# Patient Record
Sex: Female | Born: 1935 | Race: Black or African American | Hispanic: No | Smoking: Former smoker
Health system: Southern US, Community
[De-identification: ages and names within clinical notes are randomized; demographics above are authoritative.]

## PROBLEM LIST (undated history)

## (undated) DIAGNOSIS — M47812 Spondylosis without myelopathy or radiculopathy, cervical region: Secondary | ICD-10-CM

## (undated) DIAGNOSIS — N63 Unspecified lump in unspecified breast: Secondary | ICD-10-CM

## (undated) DIAGNOSIS — I420 Dilated cardiomyopathy: Secondary | ICD-10-CM

## (undated) DIAGNOSIS — I10 Essential (primary) hypertension: Secondary | ICD-10-CM

## (undated) HISTORY — PX: BACK SURGERY: SHX140

## (undated) HISTORY — PX: APPENDECTOMY: SHX54

## (undated) HISTORY — PX: CATARACT EXTRACTION: SUR2

## (undated) HISTORY — DX: Dilated cardiomyopathy: I42.0

## (undated) HISTORY — PX: KNEE SURGERY: SHX244

---

## 1997-11-20 ENCOUNTER — Other Ambulatory Visit: Admission: RE | Admit: 1997-11-20 | Discharge: 1997-11-20 | Payer: Self-pay | Admitting: Internal Medicine

## 1998-06-22 ENCOUNTER — Ambulatory Visit (HOSPITAL_COMMUNITY): Admission: RE | Admit: 1998-06-22 | Discharge: 1998-06-22 | Payer: Self-pay | Admitting: Neurosurgery

## 1998-06-22 ENCOUNTER — Encounter: Payer: Self-pay | Admitting: Neurosurgery

## 1998-07-08 ENCOUNTER — Encounter: Payer: Self-pay | Admitting: Neurosurgery

## 1998-07-08 ENCOUNTER — Ambulatory Visit (HOSPITAL_COMMUNITY): Admission: RE | Admit: 1998-07-08 | Discharge: 1998-07-08 | Payer: Self-pay | Admitting: Neurosurgery

## 1998-07-23 ENCOUNTER — Ambulatory Visit (HOSPITAL_COMMUNITY): Admission: RE | Admit: 1998-07-23 | Discharge: 1998-07-23 | Payer: Self-pay | Admitting: Neurosurgery

## 1998-07-23 ENCOUNTER — Encounter: Payer: Self-pay | Admitting: Neurosurgery

## 1998-08-06 ENCOUNTER — Encounter: Payer: Self-pay | Admitting: Neurosurgery

## 1998-08-06 ENCOUNTER — Ambulatory Visit (HOSPITAL_COMMUNITY): Admission: RE | Admit: 1998-08-06 | Discharge: 1998-08-06 | Payer: Self-pay | Admitting: Neurosurgery

## 1998-11-19 ENCOUNTER — Encounter: Payer: Self-pay | Admitting: Neurosurgery

## 1998-11-21 ENCOUNTER — Encounter: Payer: Self-pay | Admitting: Neurosurgery

## 1998-11-21 ENCOUNTER — Inpatient Hospital Stay (HOSPITAL_COMMUNITY): Admission: RE | Admit: 1998-11-21 | Discharge: 1998-11-24 | Payer: Self-pay | Admitting: Neurosurgery

## 1999-06-03 ENCOUNTER — Encounter: Admission: RE | Admit: 1999-06-03 | Discharge: 1999-06-03 | Payer: Self-pay | Admitting: Neurosurgery

## 1999-06-03 ENCOUNTER — Encounter: Payer: Self-pay | Admitting: Neurosurgery

## 1999-06-19 ENCOUNTER — Encounter: Payer: Self-pay | Admitting: Neurosurgery

## 1999-06-19 ENCOUNTER — Ambulatory Visit (HOSPITAL_COMMUNITY): Admission: RE | Admit: 1999-06-19 | Discharge: 1999-06-19 | Payer: Self-pay | Admitting: Neurosurgery

## 1999-07-07 ENCOUNTER — Inpatient Hospital Stay (HOSPITAL_COMMUNITY): Admission: RE | Admit: 1999-07-07 | Discharge: 1999-07-09 | Payer: Self-pay | Admitting: Neurosurgery

## 1999-07-07 ENCOUNTER — Encounter: Payer: Self-pay | Admitting: Neurosurgery

## 1999-10-22 ENCOUNTER — Encounter (HOSPITAL_BASED_OUTPATIENT_CLINIC_OR_DEPARTMENT_OTHER): Payer: Self-pay | Admitting: General Surgery

## 1999-10-22 ENCOUNTER — Encounter: Admission: RE | Admit: 1999-10-22 | Discharge: 1999-10-22 | Payer: Self-pay | Admitting: General Surgery

## 2000-02-09 ENCOUNTER — Ambulatory Visit (HOSPITAL_COMMUNITY): Admission: RE | Admit: 2000-02-09 | Discharge: 2000-02-09 | Payer: Self-pay | Admitting: Neurosurgery

## 2000-02-09 ENCOUNTER — Encounter: Payer: Self-pay | Admitting: Neurosurgery

## 2000-04-22 ENCOUNTER — Encounter: Payer: Self-pay | Admitting: Neurosurgery

## 2000-04-22 ENCOUNTER — Inpatient Hospital Stay (HOSPITAL_COMMUNITY): Admission: AD | Admit: 2000-04-22 | Discharge: 2000-04-26 | Payer: Self-pay | Admitting: Internal Medicine

## 2000-04-22 ENCOUNTER — Ambulatory Visit (HOSPITAL_COMMUNITY): Admission: RE | Admit: 2000-04-22 | Discharge: 2000-04-22 | Payer: Self-pay | Admitting: Neurosurgery

## 2000-04-23 ENCOUNTER — Encounter: Payer: Self-pay | Admitting: Internal Medicine

## 2000-06-17 ENCOUNTER — Ambulatory Visit (HOSPITAL_COMMUNITY): Admission: RE | Admit: 2000-06-17 | Discharge: 2000-06-17 | Payer: Self-pay | Admitting: Gastroenterology

## 2000-10-24 ENCOUNTER — Encounter (HOSPITAL_BASED_OUTPATIENT_CLINIC_OR_DEPARTMENT_OTHER): Payer: Self-pay | Admitting: General Surgery

## 2000-10-24 ENCOUNTER — Encounter: Admission: RE | Admit: 2000-10-24 | Discharge: 2000-10-24 | Payer: Self-pay | Admitting: General Surgery

## 2000-12-28 ENCOUNTER — Encounter: Payer: Self-pay | Admitting: Neurosurgery

## 2000-12-28 ENCOUNTER — Ambulatory Visit (HOSPITAL_COMMUNITY): Admission: RE | Admit: 2000-12-28 | Discharge: 2000-12-28 | Payer: Self-pay | Admitting: Neurosurgery

## 2001-01-13 ENCOUNTER — Inpatient Hospital Stay (HOSPITAL_COMMUNITY): Admission: RE | Admit: 2001-01-13 | Discharge: 2001-01-19 | Payer: Self-pay | Admitting: Neurosurgery

## 2001-01-13 ENCOUNTER — Encounter: Payer: Self-pay | Admitting: Neurosurgery

## 2001-10-26 ENCOUNTER — Encounter: Admission: RE | Admit: 2001-10-26 | Discharge: 2001-10-26 | Payer: Self-pay | Admitting: Internal Medicine

## 2001-10-26 ENCOUNTER — Encounter: Payer: Self-pay | Admitting: Internal Medicine

## 2002-01-19 ENCOUNTER — Encounter: Payer: Self-pay | Admitting: Neurosurgery

## 2002-01-19 ENCOUNTER — Encounter: Admission: RE | Admit: 2002-01-19 | Discharge: 2002-01-19 | Payer: Self-pay | Admitting: Neurosurgery

## 2002-10-19 ENCOUNTER — Encounter: Payer: Self-pay | Admitting: Internal Medicine

## 2002-10-19 ENCOUNTER — Encounter: Admission: RE | Admit: 2002-10-19 | Discharge: 2002-10-19 | Payer: Self-pay | Admitting: Internal Medicine

## 2003-08-14 ENCOUNTER — Ambulatory Visit (HOSPITAL_COMMUNITY): Admission: RE | Admit: 2003-08-14 | Discharge: 2003-08-14 | Payer: Self-pay | Admitting: Gastroenterology

## 2003-10-29 ENCOUNTER — Encounter: Admission: RE | Admit: 2003-10-29 | Discharge: 2003-10-29 | Payer: Self-pay | Admitting: Internal Medicine

## 2004-01-16 ENCOUNTER — Ambulatory Visit (HOSPITAL_COMMUNITY): Admission: RE | Admit: 2004-01-16 | Discharge: 2004-01-16 | Payer: Self-pay | Admitting: Neurosurgery

## 2004-10-29 ENCOUNTER — Encounter: Admission: RE | Admit: 2004-10-29 | Discharge: 2004-10-29 | Payer: Self-pay | Admitting: Internal Medicine

## 2005-08-11 ENCOUNTER — Encounter: Admission: RE | Admit: 2005-08-11 | Discharge: 2005-08-11 | Payer: Self-pay | Admitting: Internal Medicine

## 2005-08-30 ENCOUNTER — Ambulatory Visit (HOSPITAL_COMMUNITY): Admission: RE | Admit: 2005-08-30 | Discharge: 2005-08-30 | Payer: Self-pay | Admitting: Neurosurgery

## 2005-10-06 ENCOUNTER — Other Ambulatory Visit: Admission: RE | Admit: 2005-10-06 | Discharge: 2005-10-06 | Payer: Self-pay | Admitting: Internal Medicine

## 2005-11-01 ENCOUNTER — Encounter: Admission: RE | Admit: 2005-11-01 | Discharge: 2005-11-01 | Payer: Self-pay | Admitting: Internal Medicine

## 2006-01-13 ENCOUNTER — Ambulatory Visit (HOSPITAL_BASED_OUTPATIENT_CLINIC_OR_DEPARTMENT_OTHER): Admission: RE | Admit: 2006-01-13 | Discharge: 2006-01-14 | Payer: Self-pay | Admitting: Orthopaedic Surgery

## 2006-11-17 ENCOUNTER — Encounter: Admission: RE | Admit: 2006-11-17 | Discharge: 2006-11-17 | Payer: Self-pay | Admitting: Internal Medicine

## 2007-08-28 ENCOUNTER — Encounter: Admission: RE | Admit: 2007-08-28 | Discharge: 2007-08-28 | Payer: Self-pay | Admitting: Neurosurgery

## 2007-10-04 ENCOUNTER — Encounter: Admission: RE | Admit: 2007-10-04 | Discharge: 2007-10-04 | Payer: Self-pay | Admitting: Neurosurgery

## 2007-10-17 ENCOUNTER — Encounter: Admission: RE | Admit: 2007-10-17 | Discharge: 2007-10-17 | Payer: Self-pay | Admitting: Neurosurgery

## 2007-11-21 ENCOUNTER — Encounter: Admission: RE | Admit: 2007-11-21 | Discharge: 2007-11-21 | Payer: Self-pay | Admitting: Internal Medicine

## 2007-12-29 ENCOUNTER — Encounter: Admission: RE | Admit: 2007-12-29 | Discharge: 2007-12-29 | Payer: Self-pay | Admitting: Neurosurgery

## 2008-08-16 ENCOUNTER — Encounter: Admission: RE | Admit: 2008-08-16 | Discharge: 2008-08-16 | Payer: Self-pay | Admitting: General Surgery

## 2008-11-21 ENCOUNTER — Encounter: Admission: RE | Admit: 2008-11-21 | Discharge: 2008-11-21 | Payer: Self-pay | Admitting: Internal Medicine

## 2008-12-13 ENCOUNTER — Ambulatory Visit (HOSPITAL_COMMUNITY): Admission: RE | Admit: 2008-12-13 | Discharge: 2008-12-13 | Payer: Self-pay | Admitting: Orthopaedic Surgery

## 2008-12-18 ENCOUNTER — Encounter: Admission: RE | Admit: 2008-12-18 | Discharge: 2008-12-18 | Payer: Self-pay | Admitting: Neurosurgery

## 2009-02-04 ENCOUNTER — Inpatient Hospital Stay (HOSPITAL_COMMUNITY): Admission: RE | Admit: 2009-02-04 | Discharge: 2009-02-05 | Payer: Self-pay | Admitting: Neurosurgery

## 2009-03-26 ENCOUNTER — Inpatient Hospital Stay (HOSPITAL_COMMUNITY): Admission: RE | Admit: 2009-03-26 | Discharge: 2009-03-28 | Payer: Self-pay | Admitting: Neurosurgery

## 2009-09-09 ENCOUNTER — Encounter (INDEPENDENT_AMBULATORY_CARE_PROVIDER_SITE_OTHER): Payer: Self-pay | Admitting: Orthopaedic Surgery

## 2009-09-09 ENCOUNTER — Inpatient Hospital Stay (HOSPITAL_COMMUNITY): Admission: RE | Admit: 2009-09-09 | Discharge: 2009-09-15 | Payer: Self-pay | Admitting: Orthopaedic Surgery

## 2010-05-03 ENCOUNTER — Encounter: Payer: Self-pay | Admitting: Neurosurgery

## 2010-05-04 ENCOUNTER — Encounter: Payer: Self-pay | Admitting: Neurosurgery

## 2010-06-29 LAB — BASIC METABOLIC PANEL
BUN: 16 mg/dL (ref 6–23)
BUN: 29 mg/dL — ABNORMAL HIGH (ref 6–23)
CO2: 28 mEq/L (ref 19–32)
CO2: 29 mEq/L (ref 19–32)
Calcium: 8.4 mg/dL (ref 8.4–10.5)
Calcium: 8.5 mg/dL (ref 8.4–10.5)
Calcium: 8.5 mg/dL (ref 8.4–10.5)
Creatinine, Ser: 1.08 mg/dL (ref 0.4–1.2)
Creatinine, Ser: 1.21 mg/dL — ABNORMAL HIGH (ref 0.4–1.2)
GFR calc Af Amer: 51 mL/min — ABNORMAL LOW (ref >60)
GFR calc Af Amer: 53 mL/min — ABNORMAL LOW (ref >60)
GFR calc Af Amer: 56 mL/min — ABNORMAL LOW (ref >60)
GFR calc Af Amer: 57 mL/min — ABNORMAL LOW (ref >60)
GFR calc Af Amer: 58 mL/min — ABNORMAL LOW (ref >60)
GFR calc non Af Amer: 44 mL/min — ABNORMAL LOW (ref >60)
GFR calc non Af Amer: 46 mL/min — ABNORMAL LOW (ref >60)
GFR calc non Af Amer: 47 mL/min — ABNORMAL LOW (ref >60)
GFR calc non Af Amer: 48 mL/min — ABNORMAL LOW (ref >60)
Glucose, Bld: 115 mg/dL — ABNORMAL HIGH (ref 70–99)
Glucose, Bld: 143 mg/dL — ABNORMAL HIGH (ref 70–99)
Potassium: 3.4 mEq/L — ABNORMAL LOW (ref 3.5–5.1)
Potassium: 4.2 mEq/L (ref 3.5–5.1)
Potassium: 5.1 mEq/L (ref 3.5–5.1)
Sodium: 136 mEq/L (ref 135–145)
Sodium: 137 mEq/L (ref 135–145)
Sodium: 139 mEq/L (ref 135–145)

## 2010-06-29 LAB — COMPREHENSIVE METABOLIC PANEL
ALT: 9 U/L (ref 0–35)
AST: 21 U/L (ref 0–37)
Albumin: 4 g/dL (ref 3.5–5.2)
Alkaline Phosphatase: 115 U/L (ref 39–117)
CO2: 25 mEq/L (ref 19–32)
Chloride: 103 mEq/L (ref 96–112)
Creatinine, Ser: 1.21 mg/dL — ABNORMAL HIGH (ref 0.4–1.2)
GFR calc Af Amer: 51 mL/min — ABNORMAL LOW (ref >60)
GFR calc non Af Amer: 42 mL/min — ABNORMAL LOW (ref >60)
Potassium: 3.7 mEq/L (ref 3.5–5.1)
Total Bilirubin: 0.4 mg/dL (ref 0.3–1.2)

## 2010-06-29 LAB — CBC
HCT: 30.6 % — ABNORMAL LOW (ref 36.0–46.0)
Hemoglobin: 10.6 g/dL — ABNORMAL LOW (ref 12.0–15.0)
Hemoglobin: 8.3 g/dL — ABNORMAL LOW (ref 12.0–15.0)
Hemoglobin: 9 g/dL — ABNORMAL LOW (ref 12.0–15.0)
MCHC: 32.1 g/dL (ref 30.0–36.0)
MCHC: 33.1 g/dL (ref 30.0–36.0)
MCHC: 33.3 g/dL (ref 30.0–36.0)
MCHC: 33.4 g/dL (ref 30.0–36.0)
MCV: 86.6 fL (ref 78.0–100.0)
MCV: 86.9 fL (ref 78.0–100.0)
MCV: 87.4 fL (ref 78.0–100.0)
MCV: 86 fL (ref 78.0–100.0)
Platelets: 130 10*3/uL — ABNORMAL LOW (ref 150–400)
Platelets: 132 10*3/uL — ABNORMAL LOW (ref 150–400)
Platelets: 245 10*3/uL (ref 150–400)
Platelets: 192 10*3/uL (ref 150–400)
RBC: 2.89 MIL/uL — ABNORMAL LOW (ref 3.87–5.11)
RBC: 3.34 MIL/uL — ABNORMAL LOW (ref 3.87–5.11)
RBC: 3.56 MIL/uL — ABNORMAL LOW (ref 3.87–5.11)
RBC: 3.57 MIL/uL — ABNORMAL LOW (ref 3.87–5.11)
RBC: 4.43 MIL/uL (ref 3.87–5.11)
RDW: 15.1 % (ref 11.5–15.5)
WBC: 11 10*3/uL — ABNORMAL HIGH (ref 4.0–10.5)
WBC: 15.2 10*3/uL — ABNORMAL HIGH (ref 4.0–10.5)
WBC: 16.6 10*3/uL — ABNORMAL HIGH (ref 4.0–10.5)
WBC: 17.5 10*3/uL — ABNORMAL HIGH (ref 4.0–10.5)
WBC: 8.9 10*3/uL (ref 4.0–10.5)

## 2010-06-29 LAB — BODY FLUID CULTURE: Culture: NO GROWTH

## 2010-06-29 LAB — CROSSMATCH
ABO/RH(D): A POS
Antibody Screen: NEGATIVE
Donor AG Type: NEGATIVE

## 2010-06-29 LAB — URINALYSIS, ROUTINE W REFLEX MICROSCOPIC
Glucose, UA: NEGATIVE mg/dL
Hgb urine dipstick: NEGATIVE
Protein, ur: NEGATIVE mg/dL
Specific Gravity, Urine: 1.021 (ref 1.005–1.030)
pH: 5.5 (ref 5.0–8.0)

## 2010-06-29 LAB — DIFFERENTIAL
Basophils Absolute: 0 10*3/uL (ref 0.0–0.1)
Basophils Relative: 0 % (ref 0–1)
Eosinophils Absolute: 0.2 10*3/uL (ref 0.0–0.7)
Eosinophils Relative: 2 % (ref 0–5)
Monocytes Absolute: 0.7 10*3/uL (ref 0.1–1.0)
Monocytes Relative: 8 % (ref 3–12)

## 2010-06-29 LAB — PROTIME-INR
INR: 1.24 (ref 0.00–1.49)
INR: 1.33 (ref 0.00–1.49)
INR: 1.47 (ref 0.00–1.49)
INR: 1.82 — ABNORMAL HIGH (ref 0.00–1.49)
INR: 1.14 (ref 0.00–1.49)
Prothrombin Time: 15.4 seconds — ABNORMAL HIGH (ref 11.6–15.2)
Prothrombin Time: 16.4 seconds — ABNORMAL HIGH (ref 11.6–15.2)

## 2010-06-29 LAB — URINE MICROSCOPIC-ADD ON

## 2010-06-29 LAB — GRAM STAIN

## 2010-06-29 LAB — TYPE AND SCREEN: Donor AG Type: NEGATIVE

## 2010-06-29 LAB — URINE CULTURE

## 2010-06-29 LAB — ANAEROBIC CULTURE

## 2010-06-29 LAB — PREPARE RBC (CROSSMATCH)

## 2010-07-14 LAB — APTT: aPTT: 27 seconds (ref 24–37)

## 2010-07-14 LAB — COMPREHENSIVE METABOLIC PANEL
Albumin: 3.9 g/dL (ref 3.5–5.2)
Alkaline Phosphatase: 96 U/L (ref 39–117)
BUN: 31 mg/dL — ABNORMAL HIGH (ref 6–23)
CO2: 27 mEq/L (ref 19–32)
Chloride: 104 mEq/L (ref 96–112)
GFR calc non Af Amer: 33 mL/min — ABNORMAL LOW (ref >60)
Glucose, Bld: 113 mg/dL — ABNORMAL HIGH (ref 70–99)
Potassium: 4 mEq/L (ref 3.5–5.1)
Total Bilirubin: 0.6 mg/dL (ref 0.3–1.2)

## 2010-07-14 LAB — CBC
HCT: 38 % (ref 36.0–46.0)
Hemoglobin: 12.7 g/dL (ref 12.0–15.0)
MCV: 85.6 fL (ref 78.0–100.0)
Platelets: 188 10*3/uL (ref 150–400)
RBC: 4.44 MIL/uL (ref 3.87–5.11)
WBC: 12.1 10*3/uL — ABNORMAL HIGH (ref 4.0–10.5)

## 2010-07-14 LAB — DIFFERENTIAL
Basophils Absolute: 0.1 10*3/uL (ref 0.0–0.1)
Basophils Relative: 0 % (ref 0–1)
Eosinophils Relative: 2 % (ref 0–5)
Monocytes Absolute: 0.7 10*3/uL (ref 0.1–1.0)
Neutro Abs: 8.4 10*3/uL — ABNORMAL HIGH (ref 1.7–7.7)

## 2010-07-14 LAB — URINE MICROSCOPIC-ADD ON

## 2010-07-14 LAB — URINALYSIS, ROUTINE W REFLEX MICROSCOPIC
Glucose, UA: NEGATIVE mg/dL
Ketones, ur: 15 mg/dL — AB
pH: 5.5 (ref 5.0–8.0)

## 2010-07-14 LAB — MRSA PCR SCREENING: MRSA by PCR: NEGATIVE

## 2010-07-14 LAB — PROTIME-INR: Prothrombin Time: 14.4 seconds (ref 11.6–15.2)

## 2010-07-16 LAB — DIFFERENTIAL
Basophils Relative: 1 % (ref 0–1)
Eosinophils Absolute: 0.2 10*3/uL (ref 0.0–0.7)
Monocytes Absolute: 0.6 10*3/uL (ref 0.1–1.0)
Monocytes Relative: 6 % (ref 3–12)
Neutro Abs: 5.5 10*3/uL (ref 1.7–7.7)

## 2010-07-16 LAB — URINALYSIS, ROUTINE W REFLEX MICROSCOPIC
Nitrite: NEGATIVE
Specific Gravity, Urine: 1.024 (ref 1.005–1.030)
pH: 5 (ref 5.0–8.0)

## 2010-07-16 LAB — URINE MICROSCOPIC-ADD ON

## 2010-07-16 LAB — CBC
Platelets: 194 10*3/uL (ref 150–400)
RDW: 15.8 % — ABNORMAL HIGH (ref 11.5–15.5)

## 2010-07-16 LAB — COMPREHENSIVE METABOLIC PANEL
ALT: 9 U/L (ref 0–35)
Albumin: 4 g/dL (ref 3.5–5.2)
Alkaline Phosphatase: 103 U/L (ref 39–117)
GFR calc Af Amer: 38 mL/min — ABNORMAL LOW (ref >60)
Potassium: 4.2 mEq/L (ref 3.5–5.1)
Sodium: 142 mEq/L (ref 135–145)
Total Protein: 7.3 g/dL (ref 6.0–8.3)

## 2010-07-16 LAB — APTT: aPTT: 24 seconds (ref 24–37)

## 2010-08-20 ENCOUNTER — Ambulatory Visit (HOSPITAL_COMMUNITY)
Admission: RE | Admit: 2010-08-20 | Discharge: 2010-08-20 | Disposition: A | Discharge status: Home / Self Care | Payer: Medicare Other | Source: Ambulatory Visit | Attending: Internal Medicine | Admitting: Internal Medicine

## 2010-08-20 DIAGNOSIS — R42 Dizziness and giddiness: Secondary | ICD-10-CM | POA: Insufficient documentation

## 2010-08-20 DIAGNOSIS — I6529 Occlusion and stenosis of unspecified carotid artery: Secondary | ICD-10-CM

## 2010-08-20 DIAGNOSIS — I059 Rheumatic mitral valve disease, unspecified: Secondary | ICD-10-CM | POA: Insufficient documentation

## 2010-08-20 DIAGNOSIS — I079 Rheumatic tricuspid valve disease, unspecified: Secondary | ICD-10-CM | POA: Insufficient documentation

## 2010-08-20 DIAGNOSIS — I10 Essential (primary) hypertension: Secondary | ICD-10-CM | POA: Insufficient documentation

## 2010-08-28 NOTE — Op Note (Signed)
[place]. [facility]  Patient:    [name], [name] Visit Number: [id number] MRN: [id number]          Service Type: SUR Location: [place] 01 Attending Physician:  Melton Krebs Dictated by:   Elizabeth Sauer, M.D. Proc. Date: 01/13/01 Admit Date:  01/13/2001                             Operative Report  PREOPERATIVE DIAGNOSIS:  Spondylosis L2-3 and L3-4.  POSTOPERATIVE DIAGNOSIS:  Spondylosis L2-3 and L3-4.  OPERATIVE PROCEDURE:  L2-3 and L3-4 laminectomy, diskectomy and posterior lumbar interbody fusion with a Ray threaded fusion cage and L2 to S1 pedicle fixation.  SURGEON:  Elizabeth Sauer, M.D.  ASSISTANT:  Hosie Spangle, M.D.  ANESTHESIA:  General endotracheal anesthesia.  PREP:  Sterile Betadine prep and alcohol wipe.  COMPLICATIONS:  None.  INDICATIONS:  The patient is a 77 year old, right-handed black lady with severe spondylosis.  DESCRIPTION OF PROCEDURE:  She was taken to the operating suite, smoothly anesthetized and intubated, and placed prone on the operating table.  Following shave, prep and drape in the usual sterile fashion, the skin was infiltrated 1% lidocaine with 1:400,000 epinephrine.  The skin was incised from L1 to S1.  The lamina, remaining facet joints and transverse processes of L2, L3, L4, L5 and S1 were exposed bilaterally in the subperiosteal plane. Previous Ray cage fusions had been done at L4-5 and L5-S1.  Intraoperative x-ray confirmed correctness of level.  The pars articularis remaining lamina and inferior facet of L2 and L3 and the superior facet of L4 and L3 were removed bilaterally.  This exposed the L2, L3 and L4 roots as they rounded their respective pedicles.  On the left side at L2-3 and L3-4, there was significant lateral recess narrowing.  This was greatly decompressed with resection of the disk material and the facets.  The nerve roots were carefully explored and found to be uncovered.  Ray  threaded fusion cages were then placed at L2-3 and L3-4.  One cage had to be redirected on the right side.  Following placement of cages, pedicle screws were placed using the standard landmarks at L2, L3, L4, L5 and S1.  Intraoperative x-rays showed good placement of pedicle screws.  The rod was connected to the 90-D pedicle screws and capped.  Intraoperative x-rays show good placement of Ray cages which had been previously packed with bone graft as well as pedicle screws and rods.  Bone marrow aspirate was taken from the sacrum and the VTOS was then placed laterally over the transverse process after they had been decorticated.  The entire area was irrigated and hemostasis assured.  The fascia was reapproximated with 0 Vicryl in interrupted fashion.  Subcutaneous tissue was reapproximated with 0 Vicryl in interrupted fashion.  Subcuticular tissue was reapproximated with 3-0 Vicryl in interrupted fashion.  Skin was closed with 3-0 nylon in a running locked fashion.  Betadine and Telfa dressing was applied and made occlusive with Op-Site.  The patient returned to the recovery room in good condition. Dictated by:   Elizabeth Sauer, M.D. Attending Physician:  Melton Krebs DD:  01/13/01 TD:  01/14/01 Job: [id number]

## 2010-08-28 NOTE — H&P (Signed)
. [facility]  Patient:    [name], [name]                       MRN: [id number] Adm. Date:  [id number] Attending:  Melton Krebs                         History and Physical  ADMITTING DIAGNOSIS:  Foraminal disk at L2-3 on the left side.  SERVICE:  Neurosurgery.  HISTORY OF PRESENT ILLNESS:  This is a very nice 77 year old right-handed black  lady who in 1993 underwent a decompressive laminectomy at 4-5.  She had a lot of stenosis and a large osteophyte at L4-5, and underwent in L4-5 and L5-S1 a laminectomy, diskectomy, posterior lumbar interbody fusion.  She did reasonably  well with that, and then continued to have a lot of pain radiating down the lateral aspect of the hip and the left leg.  She underwent a CT scan, showed the cages ere well fused, and then an MRI that demonstrated a herniated disk with foraminal disk at L2-3, ______ compression at 2 in the foramen and 3 medially, and she is now admitted for an L2-3 laminectomy and diskectomy.  MEDICAL HISTORY:  Remarkable for hypertension, taking Plendil 10 mg a day and Hytrin 5 mg at bedtime.  SURGICAL HISTORY:  Remarkable for knee replacements in 1991.  She had an appendectomy as a child and anterior cervical diskectomy and fusion at 5-6 and -5.  ALLERGIES:  She has no allergies.  SOCIAL HISTORY:  She and her husband live in [place].  She neither smokes nor drinks.  FAMILY HISTORY:  Revealed her parents are deceased in their mid 31s of heart diseases and diabetes.  PHYSICAL EXAMINATION:  HEENT:  Within normal limits.  NECK:  She has good range of motion of her neck.  CHEST:  Clear.  CARDIAC:  Regular rate and rhythm.  ABDOMEN:  Nontender, no hepatosplenomegaly.  EXTREMITIES:  Without clubbing or cyanosis.  GENITOURINARY:  Deferred.  Peripheral pulses are good.  NEUROLOGIC:  She is awake, alert, and oriented.  Her cranial nerves are intact.  Motor exam reveals  strength 5/5 throughout the upper and lower extremities. She has positive straight leg raise with hip down into the groin, and sensory deficit and pain in an L2 and L3 distribution.  CLINICAL IMPRESSION:  L2-3 radiculopathy secondary to disk.  PLAN:  Lumbar laminectomy, diskectomy at L2-3.  The risks and benefits of this approach have been discussed with her, and she wished to proceed. DD:  07/07/99 TD:  07/07/99 Job: [id number]

## 2010-08-28 NOTE — Discharge Summary (Signed)
[place]. [facility]  Patient:    [name], [name]                       MRN: [id number] Adm. Date:  [id number] Disc. Date: [id number] Attending:  Melton Krebs                           Discharge Summary  COMPLICATIONS:  None.  PROCEDURE:  L2-3 extra foraminal diskectomy on the left side.  HISTORY OF PRESENT ILLNESS:  This is a 77 year old, right handed black lady whose history and physical is recounted on the chart. She has undergone a decompressive laminectomy at 4-5 and 5-1 and lumbar fusion at L4-5 and L5-S1 and now has a left foraminal disc with a left L2 and L3 radiculopathy.  PAST MEDICAL HISTORY: Remarkable for hypertension.  MEDICATIONS: She is taking Plendil and Hytrin.  PHYSICAL EXAMINATION:  General examination is unremarkable.  Neurologic exam was a left L2 radiculopathy.  HOSPITAL COURSE:  She was admitted after ascertainment of normal laboratory values and underwent a 2-3 extra foraminal diskectomy.  Postoperatively, her leg pain is completely gone.  Her strength is full.  Her incision is dry and well healing.  She is given one post OR and now that she is up and about she is being discharged home in the care of her family. She is going home on Vicodin for pain.  Her follow up will be in the [place] office in about ten days for suture removal. DD:  07/09/99 TD:  07/10/99 Job: [id number]

## 2010-08-28 NOTE — Op Note (Signed)
NAMELISETT, [name]                ACCOUNT NO.:  [id number]   MEDICAL RECORD NO.:  [id number]          PATIENT TYPE:  AMB   LOCATION:  DSC                          FACILITY:  [facility]   PHYSICIAN:  Vonna Kotyk. Whitfield, M.D.DATE OF BIRTH:  [birth year]   DATE OF PROCEDURE:  01/13/2006  DATE OF DISCHARGE:                                 OPERATIVE REPORT   PREOPERATIVE DIAGNOSES:  Impingement, right shoulder, with degenerative  arthrosis of shoulder joint, partial rotator cuff tear, degenerative joint  disease, acromioclavicular joint, and possible tear, long head of biceps  tendon.   POSTOPERATIVE DIAGNOSES:  1. Degenerative arthrosis, right shoulder.  2. Impingement.  3. Degenerative joint disease, acromioclavicular joint.   PROCEDURES:  1. Diagnostic arthroscopy, right shoulder, with aggressive debridement of      synovitis, chondroma and partial rotator cuff tear.  2. Arthroscopic subacromial decompression.  3. Arthroscopic distal clavicle resection.   SURGEON:  Vonna Kotyk. Durward Fortes, M.D.   ASSISTANT:  Erskine Emery, P.A.-C.   ANESTHESIA:  General orotracheal.   COMPLICATIONS:  None.   HISTORY:  An 77 year old female, who has been followed for problems  referable to her right shoulder.  She relates progressive pain over many  months, particularly with over-activity, and even occasionally sleeping on  her shoulder.  She has evidence of impingement and possible rotator cuff  tear with an MRI scan, revealing a partial rotator cuff tear, but  considerable impingement, chondromalacia of the joint and degenerative  change of the AC joint.  She has not responded to cortisone and anti-  inflammatory medicine and wishes to proceed with an arthroscopic evaluation.   DESCRIPTION OF PROCEDURES:  With the patient comfortable on the operating  table, and general orotracheal anesthesia, the patient was placed in a  semisitting position with a shoulder frame.  The right shoulder was then  prepped with DuraPrep from the base of the neck circumferentially to below  the elbow.   A marking pen was used to outline the [facility] joint, the coracoid and acromion.  At a point a fingerbreadths posterior and medial to the posterior angle of  the acromion, a small stab wound was made and the arthroscope easily placed  into the shoulder joint.  Diagnostic arthroscopy revealed diffuse synovitis.  There were considerable chondromalacia and thinning of the articular  cartilage on the humeral head, with some areas of exposed subchondral bone,  and a similar finding on the glenoid.  The anterior glenoid labrum was  intact.  There were no loose bodies.  The biceps tendon appeared intact,  without fraying or tears.  There was an obvious partial tear of the  supraspinatus at its humeral head attachment.  A second portal was  established anteriorly and the ArthroCare Wand inserted for debridement of  the synovitis, the loose articular cartilage and the partial rotator cuff  tear.   The arthroscope was then placed in the subacromial space posteriorly, the  camera in the subacromial space anteriorly, and a third portal established  in the lateral subacromial space.  An arthroscopic subacromial decompression  was performed, removing abundant bursal tissue.  There was obvious anterior  overhang of the acromion.  A 6-mm bur was used to debride this so that there  was no further impingement.  I carefully checked the rotator cuff at the  bursal surface and did not see any evidence of a full-thickness tear.   There was considerable chondromalacia and decrease in the space at the [facility]  joint.  Accordingly, a distal clavicle resection was performed with the same  6-mm bur with a very nice resection.  The space was then evaluated with  evidence of loose material.  Three stab wounds were infiltrated with 0.25%  Marcaine with epinephrine and closed with simple 4-0 Ethalon.  A sterile  bulky dressing was applied,  followed by a sling.   PLAN:  Recovery care, Percocet for pain, office in 1 week.      Vonna Kotyk. Durward Fortes, M.D.  Electronically Signed     PWW/MEDQ  D:  01/13/2006  T:  01/13/2006  Job:  [id number]

## 2010-08-28 NOTE — Op Note (Signed)
[place]. [facility]  Patient:    [name], [name]                       MRN: [id number] Proc. Date: 07/07/99 Adm. Date:  [id number] Attending:  Melton Krebs                           Operative Report  PREOPERATIVE DIAGNOSIS:  ____________ disk ____________  POSTOPERATIVE DIAGNOSIS:  ____________ disk ____________  OPERATION PERFORMED:  L2-3 foraminal diskectomy.  SURGEON:  Elizabeth Sauer, M.D.  ANESTHESIA:  General endotracheal.  PREP:  Sterile Betadine prep and scrub with alcohol wipe.  COMPLICATIONS:  None.  DESCRIPTION OF PROCEDURE:  This 77 year old right-handed black lady with a left  L2-L3 radiculopathy and foraminal disk at L2-3.  The patient was taken to the operating room, smoothly anesthetized and intubated, and placed prone on the operating table.  Following shave, prep and drape in the usual sterile fashion he skin was infiltrated with 1% lidocaine 1:400,000 epinephrine.  Skin incision was made and extended from the bottom of L1 to the top of L3 and the lamina of L1, 2 and L3 were exposed on the left side in the subperiosteal plane.  Several x-rays were taken to ensure we were at the correct level.  The final was taken with a nerve hook in the L2-3 neural foramen.  Working out over the transverse process of L2 and L3, the neural foramen was defined.  The intertransverse ligament was divided.  The neural foramen was carefully identified and the L2 root egressing and the dorsal root ganglion were identified.  Underneath this was identified a herniated fragment of disk which was removed.  The annular fibers of the disk were then incised and the disk space was evacuated to the extent it could be reached. The neural foramen was explored with a hook and using a curet, all material in he neural foramen was pushed into the disk space and then removed.  The nerve root was carefully palpated as well as the neural foramen found to be open.   The wound was irrigated and hemostasis assured.  The nerve root was then covered with Depo-Medrol soaked fat.  The fascia was reapproximated with 0 Vicryl in interrupted fashion. Subcutaneous tissues were reapproximated with 0 Vicryl in interrupted fashion, subcuticular tissues were reapproximated with 3-0 Vicryl in interrupted fashion. The skin was closed with 3-0 nylon in a running locked fashion.  A Betadine Telfa dressing was applied and made occlusive with Op-Site.  The patient then returned to the recovery room in good condition. DD:  07/07/99 TD:  07/08/99 Job: [id number]

## 2010-08-28 NOTE — Discharge Summary (Signed)
[place]. [facility]  Patient:    [name], [name] Visit Number: [id number] MRN: [id number]          Service Type: SUR Location: [place] 01 Attending Physician:  Melton Krebs Dictated by:   Elizabeth Sauer, M.D. Admit Date:  01/13/2001 Discharge Date: 01/19/2001                             Discharge Summary  ADMISSION DIAGNOSIS:  Lumbar spondylosis at 2-3 and 3-4.  DISCHARGE DIAGNOSIS:  Lumbar spondylosis at 2-3 and 3-4.  OPERATIVE PROCEDURE:  An L2-3 and L3-4 posterior lumbar interbody fusion with ___________ fusion cages and L4-5 and L5-S1 pedicle fixation.  SERVICE:  Neurosurgery.  COMPLICATIONS:  None.  DISCHARGE STATUS:  Alive and well.  HISTORY OF PRESENT ILLNESS:  A very nice 77 year old right-handed black lady whose history and physical is recounted in the chart.  She has undergone decompressive laminectomy as well as a fusion at 4-5. She had a myelogram that demonstrated severe spondylosis at 2-3 and 3-4. She was admitted for decompression fusion.  PAST MEDICAL HISTORY:  This is remarkable for hypertension.  MEDICATIONS:  Plendil.  PHYSICAL EXAMINATION:  General exam was unremarkable.  Neurologically, she had a tremendous amount of back pain as well as left hip pain.  Straight leg raising was positive.  Neurologically, she was intact.  HOSPITAL COURSE:  She was admitted after ascertainment of normal laboratory values and underwent an L2-3, L3-4 posterior lumbar interbody fusion with _____________ fusion cages, then she had placement of pedicle screws from L2 to S1 bilaterally.  Postoperatively, she has done well.  Her leg pain was gone. She was off the PCA in a day or two and has been participating in physical therapy.  She is up and mobile. There was some drainage from her incision.  She has been kept until that has stopped and is now drainage free for 36 hours.  She was placed on ciprofloxacin as a precaution for  skin coverage.  DISPOSITION:  She is being discharged home now to the care of her family on ciprofloxacin for five more days as well as on Percocet for pain.  FOLLOW-UP:  This will be in the [place] office in about 10 days for suture removal. Dictated by:   Elizabeth Sauer, M.D. Attending Physician:  Melton Krebs DD:  01/19/01 TD:  01/19/01 Job: [id number]

## 2010-08-28 NOTE — Procedures (Signed)
[facility]  Patient:    [name], [name]                       MRN: [id number] Proc. Date: 06/17/00 Adm. Date:  [id number] Disc. Date: [id number] Attending:  Rafael Bihari CC:         Don Broach. Carlis Abbott, M.D.   Procedure Report  PROCEDURE:  Colonoscopy with snare polypectomy and hot biopsy.  ENDOSCOPIST:  Elyse Jarvis. Amedeo Plenty, M.D.  INDICATIONS:  Screening colonoscopy in a 77 year old patient with a recent first bout of presumed diverticulitis.  DESCRIPTION OF PROCEDURE:  The patient was placed in the left lateral decubitus position and placed on the pulse monitor with continuous low flow oxygen delivered by nasal cannula.  She was sedated with 70 mg of IV Demerol and 7 mg IV Versed.  The Olympus video colonoscope was inserted into the rectum and advanced to the cecum, confirmed by transillumination of McBurneys point and visualization of the ileocecal valve and appendiceal orifice.  The prep was excellent.  In the base of the cecum there was seen a 6 mm polyp which was removed by hot biopsy.  In the ascending colon, there was a 1 cm polyp removed by snare and another 6 mm polyp removed by hot biopsy.  All polyps were sent in the same specimen container.  The remainder of the ascending as well as the transverse and descending colon appeared normal with no further polyps, masses, diverticula or other mucosal abnormalities.  There were several scattered sigmoid diverticula seen and no other abnormalities. The rectum appeared normal and retroflexed view of the anus revealed no obvious internal hemorrhoids.  The colonoscope was then withdrawn and the patient returned to the recovery room in stable condition.  She tolerated the procedure well, and there were no immediate complications.  IMPRESSION: 1. Cecal and ascending colon polyps. 2. Diverticulosis.  PLAN:  Await biopsy results for recommendations regarding future colon screening. DD:  06/17/00 TD:   06/19/00 Job: [id number]

## 2010-08-28 NOTE — H&P (Signed)
[place]. [facility]  Patient:    [name], [name] Visit Number: [id number] MRN: [id number]          Service Type: SUR Location: [place] 05 Attending Physician:  Melton Krebs Dictated by:   Elizabeth Sauer, M.D. Admit Date:  01/13/2001                           History and Physical  ADMISSION DIAGNOSIS:  Spondylosis at L2-3 and L3-4, status post L4-5 and L5-6 fusions.  HISTORY OF PRESENT ILLNESS:  This is a very nice, 77 year old, right-handed, black lady who has undergone a decompressive laminectomy at L4-5.  She did well and then developed pain in her back and down her leg in 2000.  She underwent an L4-5 and L5-S1 fusion, did well with that, and then developed a herniated disk on the right side at L3-4 in the extraforaminal position.  It was removed and she has had relentless increase in back pain and pain down both legs.  Myelography revealed severe stenosis at L2-3 and L3-4.  She is admitted now for fusion at L2-3 and L3-4 and pedicle fixation from L2-S1 for stabilization.  PAST MEDICAL HISTORY:  Remarkable for hypertension.  MEDICATIONS:  She is taking Plendil 10 mg a day and Hytrin 5 mg at bedtime.  PAST SURGICAL HISTORY:  Remarkable for knee replacements in 1991 with lumbar operations as noted before, appendectomy as a child, and anterior cervical diskectomy and fusion at C4-5 and C5-6.  ALLERGIES:  She has no allergies.  SOCIAL HISTORY:  She neither smokes nor drinks.  Both she and her husband live in [place], [place].  FAMILY HISTORY:  Both parents are deceased in their mid 46s of heart disease and diabetes.  REVIEW OF SYSTEMS:  Remarkable for back and right hip and leg pain.  PHYSICAL EXAMINATION:  Her HEENT exam is within normal limits.  NECK:  She has reasonable range of motion.  CHEST:  Clear.  CARDIAC:  Regular rate and rhythm.  ABDOMEN:  Nontender with no hepatosplenomegaly.  EXTREMITIES:  Without clubbing or  cyanosis.  GENITOURINARY:  Exam is deferred.  Peripheral pulses are good.  NEUROLOGIC:  She is awake, alert, and oriented.  Her cranial nerves are intact.  Motor exam shows 5/5 strength throughout the upper extremities.  In the lower extremities, she has dorsiflexion weakness on the left side at 5-/5. Straight leg raise is incredibly positive on the left side.  When she stands for more than about two minutes, her legs get extremely heavy and weak.  Lower extremity reflexes are absent at this time.  CLINICAL IMPRESSION:  Severe lumbar spondylosis with neurogenic claudication.  PLAN:  Interbody fusion at L2-3 and L3-4, as well as augmentation from L2-S1 with pedicle screws.  The risks and benefits of this approach have been discussed with her and she wishes to proceed. Dictated by:   Elizabeth Sauer, M.D. Attending Physician:  Melton Krebs DD:  01/13/01 TD:  01/13/01 Job: [id number]

## 2010-08-28 NOTE — Op Note (Signed)
[name], [name]                          ACCOUNT NO.:  [id number]   MEDICAL RECORD NO.:  [id number]                   PATIENT TYPE:  AMB   LOCATION:  ENDO                                 FACILITY:  [facility]   PHYSICIAN:  John C. Amedeo Plenty, M.D.                 DATE OF BIRTH:  1923-12-25   DATE OF PROCEDURE:  08/14/2003  DATE OF DISCHARGE:                                 OPERATIVE REPORT   PROCEDURE:  Colonoscopy.   INDICATION FOR PROCEDURE:  History of adenomatous colon polyps on initial  colonoscopy 3 years ago.   DESCRIPTION OF PROCEDURE:  The patient was placed in the left lateral  decubitus position and placed on the pulse monitor with continuous low-flow  oxygen delivered by nasal cannula.  She was sedated with 75 mcg IV fentanyl  and 7 mg IV Versed.  The Olympus video colonoscope was inserted into the  rectum and advanced to the cecum, confirmed by visualization of the  ileocecal valve.  I could not reach the extreme base of the cecum, and about  40% of the cecum beyond the ileocecal valve was not clearly visualized  despite abdominal pressure, torquing maneuvers, and position changes.  Otherwise, the cecum, ascending, transverse, descending, and sigmoid colon  all appeared normal with no masses, polyps, diverticula, or other mucosal  abnormalities.  The rectum likewise appeared normal, and retroflexed view of  the anus revealed no obvious internal hemorrhoids.  The scope was then  withdrawn and the patient returned to the recovery room in stable condition.  She tolerated the procedure well, and there were no immediate complications.   IMPRESSION:  Normal colonoscopy.   PLAN:  Repeat study in 5 years.                                               John C. Amedeo Plenty, M.D.    JCH/MEDQ  D:  08/14/2003  T:  08/14/2003  Job:  [id number]   cc:   Jeanann Lewandowsky, M.D.  [address] [place] [address]  Fax: [phone]

## 2010-09-14 ENCOUNTER — Other Ambulatory Visit: Payer: Self-pay | Admitting: Internal Medicine

## 2010-09-14 DIAGNOSIS — H532 Diplopia: Secondary | ICD-10-CM

## 2010-09-18 ENCOUNTER — Ambulatory Visit (HOSPITAL_COMMUNITY)
Admission: RE | Admit: 2010-09-18 | Discharge: 2010-09-18 | Disposition: A | Discharge status: Home / Self Care | Payer: Medicare Other | Source: Ambulatory Visit | Attending: Internal Medicine | Admitting: Internal Medicine

## 2010-09-18 DIAGNOSIS — I679 Cerebrovascular disease, unspecified: Secondary | ICD-10-CM | POA: Insufficient documentation

## 2010-09-18 DIAGNOSIS — J3489 Other specified disorders of nose and nasal sinuses: Secondary | ICD-10-CM | POA: Insufficient documentation

## 2010-09-18 DIAGNOSIS — H532 Diplopia: Secondary | ICD-10-CM | POA: Insufficient documentation

## 2010-09-18 MED ORDER — GADOBENATE DIMEGLUMINE 529 MG/ML IV SOLN: 10.0000 mL | Freq: Once | INTRAVENOUS | Status: DC

## 2011-06-11 ENCOUNTER — Other Ambulatory Visit: Payer: Self-pay | Admitting: Neurosurgery

## 2011-06-11 DIAGNOSIS — M47812 Spondylosis without myelopathy or radiculopathy, cervical region: Secondary | ICD-10-CM

## 2011-06-11 DIAGNOSIS — R292 Abnormal reflex: Secondary | ICD-10-CM

## 2011-06-11 DIAGNOSIS — M5030 Other cervical disc degeneration, unspecified cervical region: Secondary | ICD-10-CM

## 2011-06-21 ENCOUNTER — Ambulatory Visit
Admission: RE | Admit: 2011-06-21 | Discharge: 2011-06-21 | Disposition: A | Discharge status: Home / Self Care | Payer: Medicare Other | Source: Ambulatory Visit | Attending: Neurosurgery | Admitting: Neurosurgery

## 2011-06-21 DIAGNOSIS — M5030 Other cervical disc degeneration, unspecified cervical region: Secondary | ICD-10-CM

## 2011-06-21 DIAGNOSIS — R292 Abnormal reflex: Secondary | ICD-10-CM

## 2011-06-21 DIAGNOSIS — M47812 Spondylosis without myelopathy or radiculopathy, cervical region: Secondary | ICD-10-CM

## 2011-12-28 ENCOUNTER — Other Ambulatory Visit: Payer: Self-pay | Admitting: Internal Medicine

## 2011-12-28 DIAGNOSIS — R748 Abnormal levels of other serum enzymes: Secondary | ICD-10-CM

## 2012-01-12 ENCOUNTER — Ambulatory Visit (HOSPITAL_COMMUNITY)
Admission: RE | Admit: 2012-01-12 | Discharge: 2012-01-12 | Disposition: A | Discharge status: Home / Self Care | Payer: Medicare Other | Source: Ambulatory Visit | Attending: Internal Medicine | Admitting: Internal Medicine

## 2012-01-12 DIAGNOSIS — R748 Abnormal levels of other serum enzymes: Secondary | ICD-10-CM

## 2012-08-10 ENCOUNTER — Other Ambulatory Visit: Payer: Self-pay | Admitting: Internal Medicine

## 2012-08-10 DIAGNOSIS — Z1231 Encounter for screening mammogram for malignant neoplasm of breast: Secondary | ICD-10-CM

## 2012-09-18 ENCOUNTER — Ambulatory Visit
Admission: RE | Admit: 2012-09-18 | Discharge: 2012-09-18 | Disposition: A | Discharge status: Home / Self Care | Payer: Medicare Other | Source: Ambulatory Visit | Attending: Internal Medicine | Admitting: Internal Medicine

## 2012-09-18 ENCOUNTER — Other Ambulatory Visit: Payer: Self-pay | Admitting: Internal Medicine

## 2012-09-18 DIAGNOSIS — R928 Other abnormal and inconclusive findings on diagnostic imaging of breast: Secondary | ICD-10-CM

## 2012-09-18 DIAGNOSIS — Z1231 Encounter for screening mammogram for malignant neoplasm of breast: Secondary | ICD-10-CM

## 2012-09-19 ENCOUNTER — Other Ambulatory Visit: Payer: Self-pay | Admitting: Internal Medicine

## 2012-09-19 DIAGNOSIS — R928 Other abnormal and inconclusive findings on diagnostic imaging of breast: Secondary | ICD-10-CM

## 2012-10-03 ENCOUNTER — Other Ambulatory Visit: Payer: Self-pay | Admitting: Internal Medicine

## 2012-10-03 ENCOUNTER — Ambulatory Visit
Admission: RE | Admit: 2012-10-03 | Discharge: 2012-10-03 | Disposition: A | Discharge status: Home / Self Care | Payer: Medicare Other | Source: Ambulatory Visit | Attending: Internal Medicine | Admitting: Internal Medicine

## 2012-10-03 DIAGNOSIS — R928 Other abnormal and inconclusive findings on diagnostic imaging of breast: Secondary | ICD-10-CM

## 2013-02-26 ENCOUNTER — Other Ambulatory Visit: Payer: Self-pay | Admitting: Internal Medicine

## 2013-02-26 DIAGNOSIS — R921 Mammographic calcification found on diagnostic imaging of breast: Secondary | ICD-10-CM

## 2013-02-26 DIAGNOSIS — N6310 Unspecified lump in the right breast, unspecified quadrant: Secondary | ICD-10-CM

## 2013-03-29 ENCOUNTER — Ambulatory Visit
Admission: RE | Admit: 2013-03-29 | Discharge: 2013-03-29 | Disposition: A | Discharge status: Home / Self Care | Payer: Medicare Other | Source: Ambulatory Visit | Attending: Internal Medicine | Admitting: Internal Medicine

## 2013-03-29 DIAGNOSIS — R921 Mammographic calcification found on diagnostic imaging of breast: Secondary | ICD-10-CM

## 2013-03-29 DIAGNOSIS — N6310 Unspecified lump in the right breast, unspecified quadrant: Secondary | ICD-10-CM

## 2013-08-27 ENCOUNTER — Other Ambulatory Visit: Payer: Self-pay | Admitting: Internal Medicine

## 2013-08-27 DIAGNOSIS — R921 Mammographic calcification found on diagnostic imaging of breast: Secondary | ICD-10-CM

## 2013-08-27 DIAGNOSIS — N63 Unspecified lump in unspecified breast: Secondary | ICD-10-CM

## 2013-09-13 ENCOUNTER — Emergency Department (HOSPITAL_COMMUNITY): Payer: Medicare Other

## 2013-09-13 ENCOUNTER — Inpatient Hospital Stay (HOSPITAL_COMMUNITY): Payer: Medicare Other

## 2013-09-13 ENCOUNTER — Inpatient Hospital Stay (HOSPITAL_COMMUNITY)
Admission: EM | Admit: 2013-09-13 | Discharge: 2013-09-16 | DRG: 354 | Disposition: A | Discharge status: Home / Self Care | Payer: Medicare Other | Attending: Internal Medicine | Admitting: Internal Medicine

## 2013-09-13 ENCOUNTER — Encounter (HOSPITAL_COMMUNITY): Payer: Self-pay | Admitting: Emergency Medicine

## 2013-09-13 DIAGNOSIS — N184 Chronic kidney disease, stage 4 (severe): Secondary | ICD-10-CM

## 2013-09-13 DIAGNOSIS — I509 Heart failure, unspecified: Secondary | ICD-10-CM

## 2013-09-13 DIAGNOSIS — N179 Acute kidney failure, unspecified: Secondary | ICD-10-CM

## 2013-09-13 DIAGNOSIS — K56600 Partial intestinal obstruction, unspecified as to cause: Secondary | ICD-10-CM | POA: Diagnosis present

## 2013-09-13 DIAGNOSIS — K436 Other and unspecified ventral hernia with obstruction, without gangrene: Principal | ICD-10-CM | POA: Diagnosis present

## 2013-09-13 DIAGNOSIS — I10 Essential (primary) hypertension: Secondary | ICD-10-CM

## 2013-09-13 DIAGNOSIS — Z7982 Long term (current) use of aspirin: Secondary | ICD-10-CM

## 2013-09-13 DIAGNOSIS — N189 Chronic kidney disease, unspecified: Secondary | ICD-10-CM

## 2013-09-13 DIAGNOSIS — N183 Chronic kidney disease, stage 3 unspecified: Secondary | ICD-10-CM

## 2013-09-13 DIAGNOSIS — I129 Hypertensive chronic kidney disease with stage 1 through stage 4 chronic kidney disease, or unspecified chronic kidney disease: Secondary | ICD-10-CM | POA: Diagnosis present

## 2013-09-13 DIAGNOSIS — K56609 Unspecified intestinal obstruction, unspecified as to partial versus complete obstruction: Secondary | ICD-10-CM

## 2013-09-13 DIAGNOSIS — K439 Ventral hernia without obstruction or gangrene: Secondary | ICD-10-CM

## 2013-09-13 DIAGNOSIS — Z6834 Body mass index (BMI) 34.0-34.9, adult: Secondary | ICD-10-CM

## 2013-09-13 DIAGNOSIS — Z87891 Personal history of nicotine dependence: Secondary | ICD-10-CM

## 2013-09-13 DIAGNOSIS — Z9849 Cataract extraction status, unspecified eye: Secondary | ICD-10-CM

## 2013-09-13 DIAGNOSIS — K429 Umbilical hernia without obstruction or gangrene: Secondary | ICD-10-CM

## 2013-09-13 DIAGNOSIS — E669 Obesity, unspecified: Secondary | ICD-10-CM | POA: Diagnosis present

## 2013-09-13 HISTORY — DX: Essential (primary) hypertension: I10

## 2013-09-13 HISTORY — DX: Unspecified lump in unspecified breast: N63.0

## 2013-09-13 HISTORY — DX: Spondylosis without myelopathy or radiculopathy, cervical region: M47.812

## 2013-09-13 LAB — CBC WITH DIFFERENTIAL/PLATELET
Basophils Absolute: 0 10*3/uL (ref 0.0–0.1)
Basophils Relative: 0 % (ref 0–1)
EOS ABS: 0.3 10*3/uL (ref 0.0–0.7)
EOS PCT: 2 % (ref 0–5)
HEMATOCRIT: 39.6 % (ref 36.0–46.0)
Hemoglobin: 12.6 g/dL (ref 12.0–15.0)
LYMPHS ABS: 2.2 10*3/uL (ref 0.7–4.0)
LYMPHS PCT: 18 % (ref 12–46)
MCH: 28.4 pg (ref 26.0–34.0)
MCHC: 31.8 g/dL (ref 30.0–36.0)
MCV: 89.4 fL (ref 78.0–100.0)
MONO ABS: 0.7 10*3/uL (ref 0.1–1.0)
MONOS PCT: 6 % (ref 3–12)
Neutro Abs: 9.4 10*3/uL — ABNORMAL HIGH (ref 1.7–7.7)
Neutrophils Relative %: 74 % (ref 43–77)
Platelets: 202 10*3/uL (ref 150–400)
RBC: 4.43 MIL/uL (ref 3.87–5.11)
RDW: 15 % (ref 11.5–15.5)
WBC: 12.7 10*3/uL — AB (ref 4.0–10.5)

## 2013-09-13 LAB — URINALYSIS, ROUTINE W REFLEX MICROSCOPIC
BILIRUBIN URINE: NEGATIVE
GLUCOSE, UA: NEGATIVE mg/dL
HGB URINE DIPSTICK: NEGATIVE
KETONES UR: NEGATIVE mg/dL
Nitrite: NEGATIVE
PH: 5.5 (ref 5.0–8.0)
PROTEIN: NEGATIVE mg/dL
Specific Gravity, Urine: 1.022 (ref 1.005–1.030)
Urobilinogen, UA: 0.2 mg/dL (ref 0.0–1.0)

## 2013-09-13 LAB — COMPREHENSIVE METABOLIC PANEL
ALBUMIN: 4.1 g/dL (ref 3.5–5.2)
ALK PHOS: 142 U/L — AB (ref 39–117)
ALT: 12 U/L (ref 0–35)
AST: 18 U/L (ref 0–37)
BUN: 39 mg/dL — AB (ref 6–23)
CHLORIDE: 102 meq/L (ref 96–112)
CO2: 23 mEq/L (ref 19–32)
CREATININE: 1.86 mg/dL — AB (ref 0.50–1.10)
Calcium: 9.6 mg/dL (ref 8.4–10.5)
GFR calc Af Amer: 27 mL/min — ABNORMAL LOW (ref >90)
GFR calc non Af Amer: 23 mL/min — ABNORMAL LOW (ref >90)
Glucose, Bld: 114 mg/dL — ABNORMAL HIGH (ref 70–99)
POTASSIUM: 4.4 meq/L (ref 3.7–5.3)
Sodium: 141 mEq/L (ref 137–147)
TOTAL PROTEIN: 8.1 g/dL (ref 6.0–8.3)

## 2013-09-13 LAB — URINE MICROSCOPIC-ADD ON

## 2013-09-13 MED ORDER — IOHEXOL 300 MG/ML  SOLN
50.0000 mL | Freq: Once | INTRAMUSCULAR | Status: AC | PRN
  Administered 2013-09-13: 50 mL via ORAL

## 2013-09-13 MED ORDER — SODIUM CHLORIDE 0.9 % IV SOLN
INTRAVENOUS | Status: DC
  Administered 2013-09-13: 22:00:00 via INTRAVENOUS
  Filled 2013-09-13: qty 1000

## 2013-09-13 MED ORDER — ACETAMINOPHEN 325 MG PO TABS: 650.0000 mg | ORAL_TABLET | Freq: Four times a day (QID) | ORAL | Status: DC | PRN

## 2013-09-13 MED ORDER — ACETAMINOPHEN 650 MG RE SUPP: 650.0000 mg | Freq: Four times a day (QID) | RECTAL | Status: DC | PRN

## 2013-09-13 MED ORDER — HEPARIN SODIUM (PORCINE) 5000 UNIT/ML IJ SOLN
5000.0000 [IU] | Freq: Three times a day (TID) | INTRAMUSCULAR | Status: DC
  Administered 2013-09-13 – 2013-09-16 (×7): 5000 [IU] via SUBCUTANEOUS
  Filled 2013-09-13 (×11): qty 1

## 2013-09-13 MED ORDER — MORPHINE SULFATE 2 MG/ML IJ SOLN: 2.0000 mg | INTRAMUSCULAR | Status: DC | PRN

## 2013-09-13 MED ORDER — METOPROLOL TARTRATE 1 MG/ML IV SOLN
2.5000 mg | Freq: Four times a day (QID) | INTRAVENOUS | Status: DC
  Administered 2013-09-14 – 2013-09-16 (×9): 2.5 mg via INTRAVENOUS
  Filled 2013-09-13 (×14): qty 5

## 2013-09-13 MED ORDER — DEXTROSE-NACL 5-0.9 % IV SOLN
INTRAVENOUS | Status: DC
  Administered 2013-09-14: via INTRAVENOUS

## 2013-09-13 NOTE — Consult Note (Signed)
Reason for Consult:  Painful umbilical hernia Referring Physician:   Dr. Gaylyn Lambert is an 80 y.o. female.  HPI: She has a long-standing umbilical hernia that has progressively been getting larger especially over the last week. However, it has never caused her any significant pain. Today, she had some significant pain from the hernia as well as some nausea. She presented to the emergency department for evaluation. A CT scan was performed which demonstrated an umbilical hernia containing part of the transverse colon. The colon proximal to this was somewhat dilated. The colon distal was decompressed. The hernia was able to be reduced by the emergency department physician but does come back out. After reduction she had a bowel movement and has been passing some gas. She does feel better.  Past Medical History  Diagnosis Date  . Cervical spondylosis   . Breast mass   . Hypertension     Past Surgical History  Procedure Laterality Date  . Back surgery      x 4  . Knee surgery      x 2  . Appendectomy    . Cataract extraction      History reviewed. No pertinent family history.  Social History:  reports that she has quit smoking. She does not have any smokeless tobacco history on file. She reports that she does not drink alcohol or use illicit drugs.  Allergies: No Known Allergies  Prior to Admission medications   Medication Sig Start Date End Date Taking? Authorizing Provider  aspirin EC 81 MG tablet Take 81 mg by mouth every morning.   Yes Historical Provider, MD  felodipine (PLENDIL) 10 MG 24 hr tablet Take 10 mg by mouth every morning.   Yes Historical Provider, MD  nabumetone (RELAFEN) 500 MG tablet Take 500 mg by mouth every morning.   Yes Historical Provider, MD  traMADol (ULTRAM) 50 MG tablet Take 50 mg by mouth every 6 (six) hours as needed for moderate pain.   Yes Historical Provider, MD  valsartan-hydrochlorothiazide (DIOVAN-HCT) 160-12.5 MG per tablet Take 1 tablet by  mouth every morning.   Yes Historical Provider, MD     Results for orders placed during the hospital encounter of 09/13/13 (from the past 48 hour(s))  COMPREHENSIVE METABOLIC PANEL     Status: Abnormal   Collection Time    09/13/13  4:05 PM      Result Value Ref Range   Sodium 141  137 - 147 mEq/L   Potassium 4.4  3.7 - 5.3 mEq/L   Chloride 102  96 - 112 mEq/L   CO2 23  19 - 32 mEq/L   Glucose, Bld 114 (*) 70 - 99 mg/dL   BUN 39 (*) 6 - 23 mg/dL   Creatinine, Ser 1.86 (*) 0.50 - 1.10 mg/dL   Calcium 9.6  8.4 - 10.5 mg/dL   Total Protein 8.1  6.0 - 8.3 g/dL   Albumin 4.1  3.5 - 5.2 g/dL   AST 18  0 - 37 U/L   ALT 12  0 - 35 U/L   Alkaline Phosphatase 142 (*) 39 - 117 U/L   Total Bilirubin <0.2 (*) 0.3 - 1.2 mg/dL   GFR calc non Af Amer 23 (*) >90 mL/min   GFR calc Af Amer 27 (*) >90 mL/min   Comment: (NOTE)     The eGFR has been calculated using the CKD EPI equation.     This calculation has not been validated in all clinical situations.  eGFR's persistently <90 mL/min signify possible Chronic Kidney     Disease.  CBC WITH DIFFERENTIAL     Status: Abnormal   Collection Time    09/13/13  4:05 PM      Result Value Ref Range   WBC 12.7 (*) 4.0 - 10.5 K/uL   RBC 4.43  3.87 - 5.11 MIL/uL   Hemoglobin 12.6  12.0 - 15.0 g/dL   HCT 39.6  36.0 - 46.0 %   MCV 89.4  78.0 - 100.0 fL   MCH 28.4  26.0 - 34.0 pg   MCHC 31.8  30.0 - 36.0 g/dL   RDW 15.0  11.5 - 15.5 %   Platelets 202  150 - 400 K/uL   Neutrophils Relative % 74  43 - 77 %   Neutro Abs 9.4 (*) 1.7 - 7.7 K/uL   Lymphocytes Relative 18  12 - 46 %   Lymphs Abs 2.2  0.7 - 4.0 K/uL   Monocytes Relative 6  3 - 12 %   Monocytes Absolute 0.7  0.1 - 1.0 K/uL   Eosinophils Relative 2  0 - 5 %   Eosinophils Absolute 0.3  0.0 - 0.7 K/uL   Basophils Relative 0  0 - 1 %   Basophils Absolute 0.0  0.0 - 0.1 K/uL  URINALYSIS, ROUTINE W REFLEX MICROSCOPIC     Status: Abnormal   Collection Time    09/13/13  4:33 PM      Result  Value Ref Range   Color, Urine YELLOW  YELLOW   APPearance CLEAR  CLEAR   Specific Gravity, Urine 1.022  1.005 - 1.030   pH 5.5  5.0 - 8.0   Glucose, UA NEGATIVE  NEGATIVE mg/dL   Hgb urine dipstick NEGATIVE  NEGATIVE   Bilirubin Urine NEGATIVE  NEGATIVE   Ketones, ur NEGATIVE  NEGATIVE mg/dL   Protein, ur NEGATIVE  NEGATIVE mg/dL   Urobilinogen, UA 0.2  0.0 - 1.0 mg/dL   Nitrite NEGATIVE  NEGATIVE   Leukocytes, UA SMALL (*) NEGATIVE  URINE MICROSCOPIC-ADD ON     Status: Abnormal   Collection Time    09/13/13  4:33 PM      Result Value Ref Range   Squamous Epithelial / LPF FEW (*) RARE   WBC, UA 7-10  <3 WBC/hpf   Urine-Other MUCOUS PRESENT      Ct Abdomen Pelvis Wo Contrast  09/13/2013   CLINICAL DATA:  Mid to lower abdominal pain with abdominal bloating and nausea.  EXAM: CT ABDOMEN AND PELVIS WITHOUT CONTRAST  TECHNIQUE: Multidetector CT imaging of the abdomen and pelvis was performed following the standard protocol without IV contrast.  COMPARISON:  08/16/2008.  FINDINGS: The colon is mildly dilated, specifically the right colon, with a maximum diameter of 7.8 cm. There is no wall thickening or inflammatory change. A portion of the transverse colon enters a right paraumbilical hernia, without incarceration or strangulation. A mild degree of partial obstruction is possible, given that the colon distal to this is partly decompressed. There are scattered left colon diverticula. No diverticulitis.  Normal small bowel.  Mild stable interstitial thickening at the lung bases. The heart is normal in size.  Liver, spleen, gallbladder, pancreas, adrenal glands: Normal.  Kidneys, ureters and bladder are unremarkable. Normal uterus and adnexa.  Atherosclerotic calcifications are noted along a normal caliber abdominal aorta. No adenopathy. No ascites.  Status post posterior lumbar spine fusion from L2 through S1, with orthopedic hardware well-seated. No change from the  prior study. No osteoblastic or  osteolytic lesions.  IMPRESSION: 1. Right paraumbilical hernia, which was present previously, now contains a portion of the transverse colon. The colon proximal to this is dilated to a maximum of 7.8 cm with the colon distal to this being mostly decompressed. This suggests of low grade partial obstruction. No bowel wall thickening or inflammatory changes. No other evidence of an acute abnormality.   Electronically Signed   By: Lajean Manes M.D.   On: 09/13/2013 18:22    Review of Systems  Constitutional: Negative for fever and chills.  Respiratory: Negative for shortness of breath.   Cardiovascular: Negative for chest pain.  Gastrointestinal: Positive for nausea, abdominal pain and constipation.  Neurological:       No CVA  Endo/Heme/Allergies:       No blood clots.   Blood pressure 146/79, pulse 85, temperature 98 F (36.7 C), temperature source Oral, resp. rate 20, SpO2 96.00%. Physical Exam  Constitutional: No distress.  Overweight elderly female.  HENT:  Head: Normocephalic and atraumatic.  Neck:  Posterior scar  Cardiovascular: Normal rate and regular rhythm.   Respiratory: Effort normal and breath sounds normal.  GI: Soft.  There is a mildly tender umbilical bulge that is reducible but then it comes back out again. No erythema present. Lower midline scar is present.  Musculoskeletal:  Left knee scar.  Skin: Skin is warm and dry.  Psychiatric: She has a normal mood and affect. Her behavior is normal.    Assessment/Plan: Incarcerated umbilical hernia causing partial colon obstruction. This has been reduced enough to relieve the obstruction. Part of it still does come out after reduction. She is symptomatically much better.  Plan:  Agree with admission to the hospital. She appears to be somewhat dehydrated so rehydration would be helpful. I told her it was likely that she would need to have the hernia repaired and we discussed the open and laparoscopic approaches. I have  discussed the procedure, risks, and aftercare. Risks include but are not limited to bleeding, infection, wound healing problems, anesthesia, recurrence, accidental injury to intra-abdominal organs-such as intestine, liver, spleen, bladder, etc. We also discussed the rare complication of mesh rejection. All questions were answered.I do not think she needs emergency repair tonight period.  We will re-evaluate her in the morning.     Rhunette Croft Mckinnon Glick 09/13/2013, 10:10 PM

## 2013-09-13 NOTE — ED Notes (Signed)
Pt has hx of hernia.  Pt has been seen by MD in past.  Hernia has remained in place for a while and does not reduce.  Pt states bloating and nausea started today.  Pt tried to have bm and was unable.  Pain continues in abdomen and tightness to hernia site appears increased.

## 2013-09-13 NOTE — Progress Notes (Signed)
  CARE MANAGEMENT ED NOTE 09/13/2013  Patient:  [name], [name]   Account Number:  [id number]  Date Initiated:  09/13/2013  Documentation initiated by:  Livia Snellen  Subjective/Objective Assessment:   Patient presents to ED with nausea nad abdominal bloating.     Subjective/Objective Assessment Detail:   WBC 12.7  CT abdomen and pelvis revealed :Right paraumbilical hernia, which was present previously, now  contains a portion of the transverse colon. The colon proximal to  this is dilated to a maximum of 7.8 cm with the colon distal to this  being mostly decompressed. This suggests of low grade partial  obstruction. No bowel wall thickening or inflammatory changes. No  other evidence of an acute abnormality.     Action/Plan:   Action/Plan Detail:   Patient is NPO with IV fluids at 75cc's/hr.   Anticipated DC Date:       Status Recommendation to Physician:   Result of Recommendation:    Other ED Services  Consult Working [place]  Other    Choice offered to / List presented to:            Status of service:  Completed, signed off  ED Comments:   ED Comments Detail:  EDCM spoke to patient and her family at bedside her son and daughter.  Patient reports she has a cane, walker, shower chair, wheelchair, bedside commode and elevated toilet seat at home.  Patient does not have home health services at this time.  Patient has had home health services in the past but cannot recall the name of the agency.  Patient confirms her pcp is Dr. Iran Planas.  Patient reports he is able to perfrom her own ADL's without difficulty.  EDCM provided patient with list of home health agencies in [facility] and a printed list of private duty nursing agencies.  [facility] informed patient that with home health, she may receive a visiting RN, PT, OT, aide and social worker if needed.  EDCM explined that private duty would be an out of pocket expense for the patient.  All printed  information given to patient's daughter.  Patient and her family thankful for resources.  No further EDCM needs at this time.

## 2013-09-13 NOTE — ED Notes (Signed)
Per PA, patient is NPO at this time.

## 2013-09-13 NOTE — ED Notes (Addendum)
Initial contact- A&Ox4. C/o umbilical hernia pain and constipation starting "when the hernia started to worsen." Has had umbilical hernia (potruding to the right) for "quite some time now." Hx abdominal surgery "many years ago." Takes milk of magnesia for constipation. Hx HTN, DM. No other complaints at this time. PA at bedside to evaluate patient.

## 2013-09-13 NOTE — ED Provider Notes (Signed)
CSN: [id number]     Arrival date & time 09/13/13  1537 History   First MD Initiated Contact with Patient 09/13/13 1600     Chief Complaint  Patient presents with  . Bloated  . Nausea     (Consider location/radiation/quality/duration/timing/severity/associated sxs/prior Treatment) Patient is a 80 y.o. female presenting with abdominal pain. The history is provided by the patient. No language interpreter was used.  Abdominal Pain Pain location:  Generalized Pain quality: bloating and cramping   Pain radiates to:  Does not radiate Associated symptoms: no chest pain, no chills, no cough, no diarrhea, no dysuria, no fever, no hematuria, no shortness of breath and no vomiting   Associated symptoms comment:  She has a known umbilical hernia, asymptomatic for the past 2 years since diagnosis. In the last 2 days the hernia has remained swollen and is associated with generalized abdominal distention and discomfort. She denies vomiting, diarrhea, melena, BRB per rectum. Normal bowel movement yesterday. She denies fever, or dysuria.   Past Medical History  Diagnosis Date  . Cervical spondylosis   . Breast mass   . Hypertension    Past Surgical History  Procedure Laterality Date  . Back surgery      x 4  . Knee surgery      x 2  . Appendectomy    . Cataract extraction     History reviewed. No pertinent family history. History  Substance Use Topics  . Smoking status: Former Research scientist (life sciences)  . Smokeless tobacco: Not on file  . Alcohol Use: No   OB History   Grav Para Term Preterm Abortions TAB SAB Ect Mult Living                 Review of Systems  Constitutional: Negative for fever, chills and appetite change.  Respiratory: Negative.  Negative for cough and shortness of breath.   Cardiovascular: Negative.  Negative for chest pain.  Gastrointestinal: Positive for abdominal pain and abdominal distention. Negative for vomiting, diarrhea and blood in stool.  Genitourinary: Negative.  Negative  for dysuria and hematuria.  Musculoskeletal: Negative.  Negative for back pain.  Neurological: Negative.  Negative for weakness.      Allergies  Review of patient's allergies indicates no known allergies.  Home Medications   Prior to Admission medications   Medication Sig Start Date End Date Taking? Authorizing Provider  aspirin EC 81 MG tablet Take 81 mg by mouth every morning.   Yes Historical Provider, MD  felodipine (PLENDIL) 10 MG 24 hr tablet Take 10 mg by mouth every morning.   Yes Historical Provider, MD  nabumetone (RELAFEN) 500 MG tablet Take 500 mg by mouth every morning.   Yes Historical Provider, MD  traMADol (ULTRAM) 50 MG tablet Take 50 mg by mouth every 6 (six) hours as needed for moderate pain.   Yes Historical Provider, MD  valsartan-hydrochlorothiazide (DIOVAN-HCT) 160-12.5 MG per tablet Take 1 tablet by mouth every morning.   Yes Historical Provider, MD   BP 157/86  Pulse 110  Temp(Src) 98.2 F (36.8 C)  Resp 20  SpO2 99% Physical Exam  Constitutional: She is oriented to person, place, and time. She appears well-developed and well-nourished.  HENT:  Head: Normocephalic.  Neck: Normal range of motion. Neck supple.  Cardiovascular: Normal rate and regular rhythm.   Pulmonary/Chest: Effort normal and breath sounds normal.  Abdominal: Soft. Bowel sounds are normal. There is tenderness. There is no rebound and no guarding.  Abdomen is soft. There is  an umbilical hernia present that is moderately tender, that does not reduce under steady pressure. Right greater than left abdominal tenderness. BS present throughout.   Musculoskeletal: Normal range of motion.  Neurological: She is alert and oriented to person, place, and time.  Skin: Skin is warm and dry. No rash noted.  Psychiatric: She has a normal mood and affect.    ED Course  Procedures (including critical care time) Labs Review Labs Reviewed  URINE CULTURE  COMPREHENSIVE METABOLIC PANEL  CBC WITH  DIFFERENTIAL  URINALYSIS, ROUTINE W REFLEX MICROSCOPIC   Results for orders placed during the hospital encounter of 09/13/13  COMPREHENSIVE METABOLIC PANEL      Result Value Ref Range   Sodium 141  137 - 147 mEq/L   Potassium 4.4  3.7 - 5.3 mEq/L   Chloride 102  96 - 112 mEq/L   CO2 23  19 - 32 mEq/L   Glucose, Bld 114 (*) 70 - 99 mg/dL   BUN 39 (*) 6 - 23 mg/dL   Creatinine, Ser 1.86 (*) 0.50 - 1.10 mg/dL   Calcium 9.6  8.4 - 10.5 mg/dL   Total Protein 8.1  6.0 - 8.3 g/dL   Albumin 4.1  3.5 - 5.2 g/dL   AST 18  0 - 37 U/L   ALT 12  0 - 35 U/L   Alkaline Phosphatase 142 (*) 39 - 117 U/L   Total Bilirubin <0.2 (*) 0.3 - 1.2 mg/dL   GFR calc non Af Amer 23 (*) >90 mL/min   GFR calc Af Amer 27 (*) >90 mL/min  CBC WITH DIFFERENTIAL      Result Value Ref Range   WBC 12.7 (*) 4.0 - 10.5 K/uL   RBC 4.43  3.87 - 5.11 MIL/uL   Hemoglobin 12.6  12.0 - 15.0 g/dL   HCT 39.6  36.0 - 46.0 %   MCV 89.4  78.0 - 100.0 fL   MCH 28.4  26.0 - 34.0 pg   MCHC 31.8  30.0 - 36.0 g/dL   RDW 15.0  11.5 - 15.5 %   Platelets 202  150 - 400 K/uL   Neutrophils Relative % 74  43 - 77 %   Neutro Abs 9.4 (*) 1.7 - 7.7 K/uL   Lymphocytes Relative 18  12 - 46 %   Lymphs Abs 2.2  0.7 - 4.0 K/uL   Monocytes Relative 6  3 - 12 %   Monocytes Absolute 0.7  0.1 - 1.0 K/uL   Eosinophils Relative 2  0 - 5 %   Eosinophils Absolute 0.3  0.0 - 0.7 K/uL   Basophils Relative 0  0 - 1 %   Basophils Absolute 0.0  0.0 - 0.1 K/uL  URINALYSIS, ROUTINE W REFLEX MICROSCOPIC      Result Value Ref Range   Color, Urine YELLOW  YELLOW   APPearance CLEAR  CLEAR   Specific Gravity, Urine 1.022  1.005 - 1.030   pH 5.5  5.0 - 8.0   Glucose, UA NEGATIVE  NEGATIVE mg/dL   Hgb urine dipstick NEGATIVE  NEGATIVE   Bilirubin Urine NEGATIVE  NEGATIVE   Ketones, ur NEGATIVE  NEGATIVE mg/dL   Protein, ur NEGATIVE  NEGATIVE mg/dL   Urobilinogen, UA 0.2  0.0 - 1.0 mg/dL   Nitrite NEGATIVE  NEGATIVE   Leukocytes, UA SMALL (*) NEGATIVE   URINE MICROSCOPIC-ADD ON      Result Value Ref Range   Squamous Epithelial / LPF FEW (*) RARE  WBC, UA 7-10  <3 WBC/hpf   Urine-Other MUCOUS PRESENT     Ct Abdomen Pelvis Wo Contrast  09/13/2013   CLINICAL DATA:  Mid to lower abdominal pain with abdominal bloating and nausea.  EXAM: CT ABDOMEN AND PELVIS WITHOUT CONTRAST  TECHNIQUE: Multidetector CT imaging of the abdomen and pelvis was performed following the standard protocol without IV contrast.  COMPARISON:  08/16/2008.  FINDINGS: The colon is mildly dilated, specifically the right colon, with a maximum diameter of 7.8 cm. There is no wall thickening or inflammatory change. A portion of the transverse colon enters a right paraumbilical hernia, without incarceration or strangulation. A mild degree of partial obstruction is possible, given that the colon distal to this is partly decompressed. There are scattered left colon diverticula. No diverticulitis.  Normal small bowel.  Mild stable interstitial thickening at the lung bases. The heart is normal in size.  Liver, spleen, gallbladder, pancreas, adrenal glands: Normal.  Kidneys, ureters and bladder are unremarkable. Normal uterus and adnexa.  Atherosclerotic calcifications are noted along a normal caliber abdominal aorta. No adenopathy. No ascites.  Status post posterior lumbar spine fusion from L2 through S1, with orthopedic hardware well-seated. No change from the prior study. No osteoblastic or osteolytic lesions.  IMPRESSION: 1. Right paraumbilical hernia, which was present previously, now contains a portion of the transverse colon. The colon proximal to this is dilated to a maximum of 7.8 cm with the colon distal to this being mostly decompressed. This suggests of low grade partial obstruction. No bowel wall thickening or inflammatory changes. No other evidence of an acute abnormality.   Electronically Signed   By: Lajean Manes M.D.   On: 09/13/2013 18:22    Imaging Review No results found.    EKG Interpretation None      MDM   Final diagnoses:  None    1. Partial bowel obstruction  Discussed elevated creatinine with CT - to do oral CM only.  CT scan showing likely partial bowel obstruction. Hernia reducible per Dr. Rockie Neighbours exam but reaccumulates after pressure released. No vomiting - she remains comfortable. Discussed with Dr. Zella Richer, general surgery, who will consult on the patient. Discussed with Dr. Carles Collet of [facility] who accepts the patient for admission.  Dewaine Oats, PA-C 09/13/13 1930

## 2013-09-13 NOTE — ED Notes (Signed)
Pt transported for radiology.

## 2013-09-13 NOTE — Progress Notes (Signed)
Utilization Review completed.  Adileny Delon RN CM  

## 2013-09-14 ENCOUNTER — Encounter (HOSPITAL_COMMUNITY)
Admission: EM | Disposition: A | Discharge status: Home / Self Care | Payer: Self-pay | Source: Home / Self Care | Attending: Internal Medicine

## 2013-09-14 ENCOUNTER — Inpatient Hospital Stay (HOSPITAL_COMMUNITY): Payer: Medicare Other | Admitting: Registered Nurse

## 2013-09-14 ENCOUNTER — Encounter (HOSPITAL_COMMUNITY): Payer: Medicare Other | Admitting: Registered Nurse

## 2013-09-14 ENCOUNTER — Encounter (HOSPITAL_COMMUNITY): Payer: Self-pay | Admitting: Registered Nurse

## 2013-09-14 DIAGNOSIS — I509 Heart failure, unspecified: Secondary | ICD-10-CM

## 2013-09-14 DIAGNOSIS — N189 Chronic kidney disease, unspecified: Secondary | ICD-10-CM

## 2013-09-14 DIAGNOSIS — K56609 Unspecified intestinal obstruction, unspecified as to partial versus complete obstruction: Secondary | ICD-10-CM

## 2013-09-14 DIAGNOSIS — I10 Essential (primary) hypertension: Secondary | ICD-10-CM

## 2013-09-14 DIAGNOSIS — N179 Acute kidney failure, unspecified: Secondary | ICD-10-CM

## 2013-09-14 DIAGNOSIS — K436 Other and unspecified ventral hernia with obstruction, without gangrene: Secondary | ICD-10-CM

## 2013-09-14 HISTORY — PX: VENTRAL HERNIA REPAIR: SHX424

## 2013-09-14 LAB — CBC
HCT: 35.2 % — ABNORMAL LOW (ref 36.0–46.0)
HEMOGLOBIN: 11.7 g/dL — AB (ref 12.0–15.0)
MCH: 29.3 pg (ref 26.0–34.0)
MCHC: 33.2 g/dL (ref 30.0–36.0)
MCV: 88.2 fL (ref 78.0–100.0)
Platelets: 196 10*3/uL (ref 150–400)
RBC: 3.99 MIL/uL (ref 3.87–5.11)
RDW: 14.9 % (ref 11.5–15.5)
WBC: 8.8 10*3/uL (ref 4.0–10.5)

## 2013-09-14 LAB — BASIC METABOLIC PANEL
BUN: 34 mg/dL — AB (ref 6–23)
CHLORIDE: 104 meq/L (ref 96–112)
CO2: 26 mEq/L (ref 19–32)
Calcium: 9 mg/dL (ref 8.4–10.5)
Creatinine, Ser: 1.59 mg/dL — ABNORMAL HIGH (ref 0.50–1.10)
GFR calc Af Amer: 32 mL/min — ABNORMAL LOW (ref >90)
GFR calc non Af Amer: 28 mL/min — ABNORMAL LOW (ref >90)
GLUCOSE: 104 mg/dL — AB (ref 70–99)
Potassium: 4.5 mEq/L (ref 3.7–5.3)
Sodium: 142 mEq/L (ref 137–147)

## 2013-09-14 LAB — SURGICAL PCR SCREEN
MRSA, PCR: NEGATIVE
Staphylococcus aureus: NEGATIVE

## 2013-09-14 SURGERY — REPAIR, HERNIA, VENTRAL, LAPAROSCOPIC
Anesthesia: General

## 2013-09-14 MED ORDER — GLYCOPYRROLATE 0.2 MG/ML IJ SOLN
INTRAMUSCULAR | Status: DC | PRN
  Administered 2013-09-14: .8 mg via INTRAVENOUS

## 2013-09-14 MED ORDER — CEFAZOLIN SODIUM-DEXTROSE 2-3 GM-% IV SOLR
INTRAVENOUS | Status: AC
  Filled 2013-09-14: qty 50

## 2013-09-14 MED ORDER — MUPIROCIN 2 % EX OINT
TOPICAL_OINTMENT | Freq: Once | CUTANEOUS | Status: AC
  Administered 2013-09-14: 1 via NASAL
  Filled 2013-09-14: qty 22

## 2013-09-14 MED ORDER — ROCURONIUM BROMIDE 100 MG/10ML IV SOLN
INTRAVENOUS | Status: DC | PRN
  Administered 2013-09-14: 20 mg via INTRAVENOUS
  Administered 2013-09-14: 10 mg via INTRAVENOUS

## 2013-09-14 MED ORDER — PROPOFOL 10 MG/ML IV BOLUS
INTRAVENOUS | Status: AC
  Filled 2013-09-14: qty 20

## 2013-09-14 MED ORDER — FENTANYL CITRATE 0.05 MG/ML IJ SOLN
INTRAMUSCULAR | Status: DC | PRN
  Administered 2013-09-14 (×3): 50 ug via INTRAVENOUS
  Administered 2013-09-14: 100 ug via INTRAVENOUS

## 2013-09-14 MED ORDER — EPHEDRINE SULFATE 50 MG/ML IJ SOLN
INTRAMUSCULAR | Status: DC | PRN
  Administered 2013-09-14: 5 mg via INTRAVENOUS

## 2013-09-14 MED ORDER — OXYCODONE HCL 5 MG/5ML PO SOLN
5.0000 mg | Freq: Once | ORAL | Status: DC | PRN
  Filled 2013-09-14: qty 5

## 2013-09-14 MED ORDER — DEXAMETHASONE SODIUM PHOSPHATE 10 MG/ML IJ SOLN
INTRAMUSCULAR | Status: DC | PRN
  Administered 2013-09-14: 10 mg via INTRAVENOUS

## 2013-09-14 MED ORDER — ONDANSETRON HCL 4 MG/2ML IJ SOLN
INTRAMUSCULAR | Status: DC | PRN
  Administered 2013-09-14: 4 mg via INTRAVENOUS

## 2013-09-14 MED ORDER — LACTATED RINGERS IV SOLN
INTRAVENOUS | Status: DC
  Administered 2013-09-14: 1000 mL via INTRAVENOUS
  Administered 2013-09-14: 14:00:00 via INTRAVENOUS

## 2013-09-14 MED ORDER — PROMETHAZINE HCL 25 MG/ML IJ SOLN: 6.2500 mg | INTRAMUSCULAR | Status: DC | PRN

## 2013-09-14 MED ORDER — HYDROMORPHONE HCL PF 1 MG/ML IJ SOLN: 0.2500 mg | INTRAMUSCULAR | Status: DC | PRN

## 2013-09-14 MED ORDER — BIOTENE DRY MOUTH MT LIQD
15.0000 mL | Freq: Two times a day (BID) | OROMUCOSAL | Status: DC
  Administered 2013-09-14 – 2013-09-15 (×3): 15 mL via OROMUCOSAL

## 2013-09-14 MED ORDER — FENTANYL CITRATE 0.05 MG/ML IJ SOLN
INTRAMUSCULAR | Status: AC
Start: 2013-09-14 — End: 2013-09-14
  Filled 2013-09-14: qty 5

## 2013-09-14 MED ORDER — ONDANSETRON HCL 4 MG/2ML IJ SOLN
4.0000 mg | Freq: Four times a day (QID) | INTRAMUSCULAR | Status: DC | PRN
  Administered 2013-09-14: 4 mg via INTRAVENOUS
  Filled 2013-09-14: qty 2

## 2013-09-14 MED ORDER — LIDOCAINE HCL (CARDIAC) 20 MG/ML IV SOLN
INTRAVENOUS | Status: DC | PRN
  Administered 2013-09-14: 75 mg via INTRAVENOUS

## 2013-09-14 MED ORDER — LIDOCAINE HCL (CARDIAC) 20 MG/ML IV SOLN
INTRAVENOUS | Status: AC
  Filled 2013-09-14: qty 5

## 2013-09-14 MED ORDER — OXYCODONE HCL 5 MG PO TABS: 5.0000 mg | ORAL_TABLET | Freq: Once | ORAL | Status: DC | PRN

## 2013-09-14 MED ORDER — BUPIVACAINE HCL (PF) 0.25 % IJ SOLN
INTRAMUSCULAR | Status: AC
  Filled 2013-09-14: qty 30

## 2013-09-14 MED ORDER — HYDROCODONE-ACETAMINOPHEN 5-325 MG PO TABS: 1.0000 | ORAL_TABLET | ORAL | Status: DC | PRN

## 2013-09-14 MED ORDER — PNEUMOCOCCAL VAC POLYVALENT 25 MCG/0.5ML IJ INJ
0.5000 mL | INJECTION | INTRAMUSCULAR | Status: AC
  Administered 2013-09-15: 0.5 mL via INTRAMUSCULAR
  Filled 2013-09-14 (×2): qty 0.5

## 2013-09-14 MED ORDER — CHLORHEXIDINE GLUCONATE 0.12 % MT SOLN
15.0000 mL | Freq: Two times a day (BID) | OROMUCOSAL | Status: DC
  Administered 2013-09-14 – 2013-09-16 (×4): 15 mL via OROMUCOSAL
  Filled 2013-09-14 (×7): qty 15

## 2013-09-14 MED ORDER — NEOSTIGMINE METHYLSULFATE 10 MG/10ML IV SOLN
INTRAVENOUS | Status: DC | PRN
  Administered 2013-09-14: 5 mg via INTRAVENOUS

## 2013-09-14 MED ORDER — BUPIVACAINE HCL 0.25 % IJ SOLN
INTRAMUSCULAR | Status: DC | PRN
  Administered 2013-09-14: 18 mL

## 2013-09-14 MED ORDER — SUCCINYLCHOLINE CHLORIDE 20 MG/ML IJ SOLN
INTRAMUSCULAR | Status: DC | PRN
  Administered 2013-09-14: 100 mg via INTRAVENOUS

## 2013-09-14 MED ORDER — 0.9 % SODIUM CHLORIDE (POUR BTL) OPTIME
TOPICAL | Status: DC | PRN
  Administered 2013-09-14: 1000 mL

## 2013-09-14 MED ORDER — ONDANSETRON HCL 4 MG/2ML IJ SOLN
INTRAMUSCULAR | Status: AC
  Filled 2013-09-14: qty 2

## 2013-09-14 MED ORDER — DEXAMETHASONE SODIUM PHOSPHATE 10 MG/ML IJ SOLN
INTRAMUSCULAR | Status: AC
  Filled 2013-09-14: qty 1

## 2013-09-14 MED ORDER — CEFAZOLIN SODIUM-DEXTROSE 2-3 GM-% IV SOLR
2.0000 g | Freq: Once | INTRAVENOUS | Status: AC
  Administered 2013-09-14: 2 g via INTRAVENOUS

## 2013-09-14 MED ORDER — PROPOFOL 10 MG/ML IV BOLUS
INTRAVENOUS | Status: DC | PRN
  Administered 2013-09-14: 150 mg via INTRAVENOUS
  Administered 2013-09-14: 50 mg via INTRAVENOUS

## 2013-09-14 MED ORDER — LABETALOL HCL 5 MG/ML IV SOLN
INTRAVENOUS | Status: DC | PRN
  Administered 2013-09-14: 2.5 mg via INTRAVENOUS

## 2013-09-14 MED ORDER — GLYCOPYRROLATE 0.2 MG/ML IJ SOLN
INTRAMUSCULAR | Status: AC
  Filled 2013-09-14: qty 4

## 2013-09-14 MED ORDER — MEPERIDINE HCL 50 MG/ML IJ SOLN: 6.2500 mg | INTRAMUSCULAR | Status: DC | PRN

## 2013-09-14 MED ORDER — LABETALOL HCL 5 MG/ML IV SOLN
INTRAVENOUS | Status: AC
  Filled 2013-09-14: qty 4

## 2013-09-14 SURGICAL SUPPLY — 47 items
ADH SKN CLS APL DERMABOND .7 (GAUZE/BANDAGES/DRESSINGS) ×1
APL SKNCLS STERI-STRIP NONHPOA (GAUZE/BANDAGES/DRESSINGS) ×1
BENZOIN TINCTURE PRP APPL 2/3 (GAUZE/BANDAGES/DRESSINGS) ×2 IMPLANT
BINDER ABDOMINAL 12 ML 46-62 (SOFTGOODS) ×2 IMPLANT
CANISTER SUCTION 2500CC (MISCELLANEOUS) ×2 IMPLANT
DECANTER SPIKE VIAL GLASS SM (MISCELLANEOUS) ×2 IMPLANT
DERMABOND ADVANCED (GAUZE/BANDAGES/DRESSINGS) ×1
DERMABOND ADVANCED .7 DNX12 (GAUZE/BANDAGES/DRESSINGS) IMPLANT
DEVICE TROCAR PUNCTURE CLOSURE (ENDOMECHANICALS) ×2 IMPLANT
DISSECTOR BLUNT TIP ENDO 5MM (MISCELLANEOUS) IMPLANT
DRAIN CHANNEL RND F F (WOUND CARE) IMPLANT
DRAPE INCISE IOBAN 66X45 STRL (DRAPES) ×2 IMPLANT
DRAPE LAPAROSCOPIC ABDOMINAL (DRAPES) ×2 IMPLANT
ELECT REM PT RETURN 9FT ADLT (ELECTROSURGICAL) ×2
ELECTRODE REM PT RTRN 9FT ADLT (ELECTROSURGICAL) ×1 IMPLANT
EVACUATOR SILICONE 100CC (DRAIN) IMPLANT
GLOVE BIOGEL PI IND STRL 7.0 (GLOVE) ×1 IMPLANT
GLOVE BIOGEL PI INDICATOR 7.0 (GLOVE) ×1
GLOVE SURG SIGNA 7.5 PF LTX (GLOVE) ×2 IMPLANT
GOWN SPEC L4 XLG W/TWL (GOWN DISPOSABLE) ×2 IMPLANT
GOWN STRL REUS W/ TWL XL LVL3 (GOWN DISPOSABLE) ×3 IMPLANT
GOWN STRL REUS W/TWL LRG LVL3 (GOWN DISPOSABLE) ×2 IMPLANT
GOWN STRL REUS W/TWL XL LVL3 (GOWN DISPOSABLE) ×6
KIT BASIN OR (CUSTOM PROCEDURE TRAY) ×2 IMPLANT
MARKER SKIN DUAL TIP RULER LAB (MISCELLANEOUS) ×2 IMPLANT
MESH PARIETEX 4.7 (Mesh General) ×1 IMPLANT
NDL INSUFFLATION 14GA 150MM (NEEDLE) IMPLANT
NDL SPNL 22GX3.5 QUINCKE BK (NEEDLE) ×1 IMPLANT
NEEDLE INSUFFLATION 14GA 150MM (NEEDLE) IMPLANT
NEEDLE SPNL 22GX3.5 QUINCKE BK (NEEDLE) ×2 IMPLANT
NS IRRIG 1000ML POUR BTL (IV SOLUTION) ×2 IMPLANT
PENCIL BUTTON HOLSTER BLD 10FT (ELECTRODE) ×2 IMPLANT
SCISSORS LAP 5X35 DISP (ENDOMECHANICALS) ×2 IMPLANT
SET IRRIG TUBING LAPAROSCOPIC (IRRIGATION / IRRIGATOR) IMPLANT
SHEARS HARMONIC ACE PLUS 36CM (ENDOMECHANICALS) ×2 IMPLANT
SOLUTION ANTI FOG 6CC (MISCELLANEOUS) ×2 IMPLANT
STAPLER VISISTAT 35W (STAPLE) IMPLANT
STRIP CLOSURE SKIN 1/4X4 (GAUZE/BANDAGES/DRESSINGS) ×2 IMPLANT
SUT NOVA 0 T19/GS 22DT (SUTURE) ×4 IMPLANT
SUT VIC AB 5-0 PS2 18 (SUTURE) ×4 IMPLANT
TACKER 5MM HERNIA 3.5CML NAB (ENDOMECHANICALS) ×2 IMPLANT
TOWEL OR 17X26 10 PK STRL BLUE (TOWEL DISPOSABLE) ×2 IMPLANT
TRAY FOLEY CATH 14FRSI W/METER (CATHETERS) ×2 IMPLANT
TRAY LAP CHOLE (CUSTOM PROCEDURE TRAY) ×2 IMPLANT
TROCAR BLADELESS OPT 5 75 (ENDOMECHANICALS) ×6 IMPLANT
TROCAR XCEL NON-BLD 11X100MML (ENDOMECHANICALS) ×2 IMPLANT
TUBING INSUFFLATION 10FT LAP (TUBING) ×2 IMPLANT

## 2013-09-14 NOTE — Anesthesia Postprocedure Evaluation (Signed)
Anesthesia Post Note  Patient: [name]  Procedure(s) Performed: Procedure(s) (LRB): LAPAROSCOPIC ventral HERNIA REPAIR with mesh  (N/A)  Anesthesia type: General  Patient location: PACU  Post pain: Pain level controlled  Post assessment: Post-op Vital signs reviewed  Last Vitals: BP 168/79  Pulse 75  Temp(Src) 36.7 C (Oral)  Resp 18  Ht 5\' 4"  (1.626 m)  Wt 200 lb 1.6 oz (90.765 kg)  BMI 34.33 kg/m2  SpO2 95%  Post vital signs: Reviewed  Level of consciousness: sedated  Complications: No apparent anesthesia complications

## 2013-09-14 NOTE — Transfer of Care (Signed)
Immediate Anesthesia Transfer of Care Note  Patient: [name]  Procedure(s) Performed: Procedure(s): LAPAROSCOPIC ventral HERNIA REPAIR with mesh  (N/A)  Patient Location: PACU  Anesthesia Type:General  Level of Consciousness: awake, alert , oriented and patient cooperative  Airway & Oxygen Therapy: Patient Spontanous Breathing and Patient connected to face mask oxygen  Post-op Assessment: Report given to PACU RN, Post -op Vital signs reviewed and stable and Patient moving all extremities  Post vital signs: Reviewed and stable  Complications: No apparent anesthesia complications

## 2013-09-14 NOTE — Progress Notes (Signed)
Pt having surgery today.  Last dose of Heparin 5000 units given at 2239 last evening. Moreen Fowler, RN

## 2013-09-14 NOTE — H&P (Signed)
History and Physical  [name] [id number] DOB: [birth year] DOA: 09/13/2013   PCP: [name], MD   Chief Complaint: abdominal pain  HPI:  80 year old female history of hypertension, periumbilical hernia, CKD stage III presents with one-day history of worsening abdominal pain. The patient has had a history of a periumbilical hernia for many years. She states that she has not had any problems with that. However in the past few days she has noted that the hernia has become slightly larger and more painful. She denied any fevers, chills, chest pain, shortness of breath, vomiting, diarrhea, hematochezia, melena. She has had some nausea. She states that she had a bowel movement in the emergency department. Normally the patient struggles with constipation and has approximately 3 bowel movements per week. The patient occasionally takes cathartics to help her with bowel movements. In the emergency department, CT of the abdomen and pelvis revealed a mildly dilated right colon up to 7.8 cm with a portion of the transverse colon entering the right periumbilical hernia without incarceration or strangulation. There was concern for a mild degree of obstruction. General surgery was consulted. They did not feel that the patient needed emergent surgery and could be managed medically. As a result, they requested medical admission. WBC was 12.7. Serum creatinine 1.6. Hepatic panel was unremarkable. Urinalysis was negative for pyuria. Assessment/Plan: Partial obstruction of the colon -The patient has had a small bowel movement in the emergency department -Gen. surgery has been consulted--recommends conservative medical therapy at this point -N.p.o. with bowel rest -IV fluids -Pain control -2-view abd xray in am Acute on chronic renal failure (CKD3) -Baseline creatinine 1.1-1.5 -IV fluids -Patient appears hypovolemic Hypertension -Start the patient on metoprolol 2.5 mg IV every 6 hours as the  patient is npo -restart po anti-HTN when able to take po -hold plendil and valsartan-HCTZ for now        Past Medical History  Diagnosis Date  . Cervical spondylosis   . Breast mass   . Hypertension    Past Surgical History  Procedure Laterality Date  . Back surgery      x 4  . Knee surgery      x 2  . Appendectomy    . Cataract extraction     Social History:  reports that she has quit smoking. She does not have any smokeless tobacco history on file. She reports that she does not drink alcohol or use illicit drugs.   History reviewed. No pertinent family history.   No Known Allergies    Prior to Admission medications   Medication Sig Start Date End Date Taking? Authorizing Provider  aspirin EC 81 MG tablet Take 81 mg by mouth every morning.   Yes Historical Provider, MD  felodipine (PLENDIL) 10 MG 24 hr tablet Take 10 mg by mouth every morning.   Yes Historical Provider, MD  nabumetone (RELAFEN) 500 MG tablet Take 500 mg by mouth every morning.   Yes Historical Provider, MD  traMADol (ULTRAM) 50 MG tablet Take 50 mg by mouth every 6 (six) hours as needed for moderate pain.   Yes Historical Provider, MD  valsartan-hydrochlorothiazide (DIOVAN-HCT) 160-12.5 MG per tablet Take 1 tablet by mouth every morning.   Yes Historical Provider, MD    Review of Systems:  Constitutional:  No weight loss, night sweats, Fevers, chills, fatigue.  Head&Eyes: No headache.  No vision loss. ENT:  No Difficulty swallowing,Tooth/dental problems,Sore throat,   Cardio-vascular:  No chest pain, Orthopnea,  PND, swelling in lower extremities,  dizziness, palpitations  GI:  No   vomiting, diarrhea, loss of appetite, hematochezia, melena, heartburn, indigestion, Resp:  No shortness of breath with exertion or at rest. No cough. No coughing up of blood .No wheezing.No chest wall deformity  Skin:  no rash or lesions.  GU:  no dysuria, change in color of urine, no urgency or frequency. No  flank pain.  Musculoskeletal:  No joint pain or swelling. No decreased range of motion. No back pain.  Psych:  No change in mood or affect. No depression or anxiety. Neurologic: No headache, no dysesthesia, no focal weakness, no vision loss. No syncope  Physical Exam: Filed Vitals:   09/13/13 1800 09/13/13 1820 09/13/13 1919 09/13/13 2000  BP: 153/78  157/81 146/79  Pulse: 86  82 92  Temp:  98.3 F (36.8 C) 98 F (36.7 C) 98.7 F (37.1 C)  TempSrc:   Oral Oral  Resp:   20 20  Height:    5\' 4"  (1.626 m)  Weight:    90.765 kg (200 lb 1.6 oz)  SpO2: 99%  96% 96%   General:  A&O x 3, NAD, nontoxic, pleasant/cooperative Head/Eye: No conjunctival hemorrhage, no icterus, Narcissa/AT, No nystagmus ENT:  No icterus,  No thrush, good dentition, no pharyngeal exudate Neck:  No masses, no lymphadenpathy, no bruits CV:  RRR, no rub, no gallop, no S3 Lung:  CTAB, good air movement, no wheeze, no rhonchi Abdomen: soft/mild periumbilical pain, +BS, nondistended, no peritoneal signs;  Periumbilical hernia reducible Ext: No cyanosis, No rashes, No petechiae, No lymphangitis, trace LE edema   Labs on Admission:  Basic Metabolic Panel:  Recent Labs Lab 09/13/13 1605  NA 141  K 4.4  CL 102  CO2 23  GLUCOSE 114*  BUN 39*  CREATININE 1.86*  CALCIUM 9.6   Liver Function Tests:  Recent Labs Lab 09/13/13 1605  AST 18  ALT 12  ALKPHOS 142*  BILITOT <0.2*  PROT 8.1  ALBUMIN 4.1   No results found for this basename: LIPASE, AMYLASE,  in the last 168 hours No results found for this basename: AMMONIA,  in the last 168 hours CBC:  Recent Labs Lab 09/13/13 1605  WBC 12.7*  NEUTROABS 9.4*  HGB 12.6  HCT 39.6  MCV 89.4  PLT 202   Cardiac Enzymes: No results found for this basename: CKTOTAL, CKMB, CKMBINDEX, TROPONINI,  in the last 168 hours BNP: No components found with this basename: POCBNP,  CBG: No results found for this basename: GLUCAP,  in the last 168  hours  Radiological Exams on Admission: Ct Abdomen Pelvis Wo Contrast  09/13/2013   CLINICAL DATA:  Mid to lower abdominal pain with abdominal bloating and nausea.  EXAM: CT ABDOMEN AND PELVIS WITHOUT CONTRAST  TECHNIQUE: Multidetector CT imaging of the abdomen and pelvis was performed following the standard protocol without IV contrast.  COMPARISON:  08/16/2008.  FINDINGS: The colon is mildly dilated, specifically the right colon, with a maximum diameter of 7.8 cm. There is no wall thickening or inflammatory change. A portion of the transverse colon enters a right paraumbilical hernia, without incarceration or strangulation. A mild degree of partial obstruction is possible, given that the colon distal to this is partly decompressed. There are scattered left colon diverticula. No diverticulitis.  Normal small bowel.  Mild stable interstitial thickening at the lung bases. The heart is normal in size.  Liver, spleen, gallbladder, pancreas, adrenal glands: Normal.  Kidneys, ureters and bladder are  unremarkable. Normal uterus and adnexa.  Atherosclerotic calcifications are noted along a normal caliber abdominal aorta. No adenopathy. No ascites.  Status post posterior lumbar spine fusion from L2 through S1, with orthopedic hardware well-seated. No change from the prior study. No osteoblastic or osteolytic lesions.  IMPRESSION: 1. Right paraumbilical hernia, which was present previously, now contains a portion of the transverse colon. The colon proximal to this is dilated to a maximum of 7.8 cm with the colon distal to this being mostly decompressed. This suggests of low grade partial obstruction. No bowel wall thickening or inflammatory changes. No other evidence of an acute abnormality.   Electronically Signed   By: Lajean Manes M.D.   On: 09/13/2013 18:22        Time spent:60 minutes Code Status:   FULL Family Communication:   daughter at bedside   Orson Eva, DO  [facility] Pager  [phone]  If 7PM-7AM, please contact night-coverage [email or web address] Password [email or web address] 09/14/2013, 12:03 AM

## 2013-09-14 NOTE — Op Note (Signed)
Name:  CHANTA SCALERA MRN:  [id number] Date of birth:  14-Mar-1924  OPERATIVE NOTE  09/14/2013  12:49 PM  PATIENT:  [name], 80 y.o., female, MRN: [id number]  PREOP DIAGNOSIS:  abdominal wall hernia  POSTOP DIAGNOSIS:   Incarcerated ventral hernia, approx 3 x 5 cm  PROCEDURE:   Procedure(s): LAPAROSCOPIC ventral HERNIA REPAIR with mesh   SURGEON:   Alphonsa Overall, M.D.  ASSISTANT:   None  ANESTHESIA:   general  Anesthesiologist: Nickie Retort, MD CRNA: Ofilia Neas, CRNA; Courtney Heys Armistead, CRNA  General  EBL:  minimal  ml  BLOOD ADMINISTERED: none  DRAINS: none   LOCAL MEDICATIONS USED:   20 cc 1/4% marcaine  SPECIMEN:   none  COUNTS CORRECT:  YES  INDICATIONS FOR PROCEDURE:  FORESTINE SECKMAN is a 80 y.o. (DOB: 1923-06-20) AA  female whose primary care physician is [name], MD and comes for repair of incarcerated abdominal wall hernia.  She was seen last night by Dr. Brien Mates.   The indications and risks of the surgery were explained to the patient.  The risks include, but are not limited to, infection, bleeding, and nerve injury.  OPERATIVE NOTE:  The patient was taken to room 6 at [facility].  He underwent a general anesthesia.  He was given 2 grams of Ancef at the beginning of the operation.   A time out was held and the surgical checklist run.   The abdomen was prepped with Cholroprep and sterilely draped.  I covered the abdomen with a Ioban drape.   I accessed the LUQ with a 10 mm trocar.  An additional 5 mm trocar was placed in the left mid abdomen and a 5 mm trocar in the right mid abdomen..   She had a 3 x 5 cm defect just below and to the right of the umbilicus.  There was omentum incarcerated in the defect.   I closed the defect with 1 Novafil.   Then I placed a 12 x 12 cm Parietex mesh in the abdomen.  It had 6 holding sutures of 0 Novafil.  These were tied down to secure the mesh to the anterior abdominal wall.  I then used 25 Securestrap  tacks to tack the mesh to the anterior abdominal wall.   The abdomen was deflated.  The mesh was inspected and there were no defects around the edge.   The trocars were then removed.  There was no bleeding at the trocar sites.  The incisions were closed with 5-0 Monocryl and painted with Dermabond. The puncture sites were closed with Steristrips.  She did have a foley placed prior pre operatively, this was removed prior to transfer to the RR.   The sponge and needle count were correct.  The patient was transferred to the recovery room in good condition.    Type of repair - mesh  (choices - primary suture, mesh, or component)  Name of mesh - Parietex  Size of mesh - Length 12 cm, Width 12 cm  Mesh overlap - 5 cm  Placement of mesh - beneath the fascia and into peritoneal cavity  (choices - beneath fascia and into peritoneal cavity, beneath fascia but external to peritoneal cavity, between the muscle and fascia, above or external to fascia)  Alphonsa Overall, MD, [facility] Surgery Pager: [phone] Office phone:  [phone]

## 2013-09-14 NOTE — Anesthesia Preprocedure Evaluation (Addendum)
Anesthesia Evaluation  Patient identified by MRN, date of birth, ID band Patient awake    Reviewed: Allergy & Precautions, H&P , NPO status , Patient's Chart, lab work & pertinent test results  Airway Mallampati: II TM Distance: >3 FB Neck ROM: Full    Dental  (+) Dental Advisory Given   Pulmonary former smoker,  breath sounds clear to auscultation        Cardiovascular hypertension, Pt. on medications Rhythm:Regular Rate:Normal     Neuro/Psych negative neurological ROS  negative psych ROS   GI/Hepatic negative GI ROS, Neg liver ROS,   Endo/Other  negative endocrine ROS  Renal/GU CRFRenal disease     Musculoskeletal negative musculoskeletal ROS (+)   Abdominal   Peds  Hematology negative hematology ROS (+)   Anesthesia Other Findings   Reproductive/Obstetrics negative OB ROS                        Anesthesia Physical Anesthesia Plan  ASA: III  Anesthesia Plan: General   Post-op Pain Management:    Induction: Intravenous  Airway Management Planned: Oral ETT  Additional Equipment:   Intra-op Plan:   Post-operative Plan: Extubation in OR  Informed Consent: I have reviewed the patients History and Physical, chart, labs and discussed the procedure including the risks, benefits and alternatives for the proposed anesthesia with the patient or authorized representative who has indicated his/her understanding and acceptance.   Dental advisory given  Plan Discussed with: CRNA  Anesthesia Plan Comments:         Anesthesia Quick Evaluation

## 2013-09-14 NOTE — Progress Notes (Signed)
Patient briefly seen and examined earlier today prior to OR. Database reviewed. Admitted earlier today for a bowel obstruction related to a ventral hernia. Surgery has seen the patient and has taken her to the OR today for repair. Will continue to follow.  Domingo Mend, MD [facility] Pager: [phone]

## 2013-09-14 NOTE — Progress Notes (Signed)
General Surgery Note  LOS: 1 day  POD -     Assessment/Plan: 1.  Ventral/umbilical hernia  Prior history of appendectomy/Ectopic preg  I discussed the indications and complications of hernia surgery with the patient.  I discussed both the laparoscopic and open approach to hernia repair..  The potential risks of hernia surgery include, but are not limited to, bleeding, infection, open surgery, nerve injury, and recurrence of the hernia.  I provided the patient literature about hernia surgery.  I spoke to the daughter, [name] [phone] about the surgery.  Will proceed later today.  2.  Moderate obesity 3.  Chronic renal insufficiency  Creat better today 4.  HTN   Active Problems:   Partial bowel obstruction   Acute on chronic renal failure   Unspecified essential hypertension   Subjective:  Doing well this am.  I discussed surgery with the patient. Objective:   Filed Vitals:   09/14/13 0600  BP: 154/84  Pulse: 79  Temp: 97.7 F (36.5 C)  Resp: 18     Intake/Output from previous day:  06/04 0701 - 06/05 0700 In: 459 [I.V.:459] Out: 590 [Urine:590]  Intake/Output this shift:  Total I/O In: -  Out: 200 [Urine:200]   Physical Exam:   General: Obese AA F who is alert and oriented.    HEENT: Normal. Pupils equal. .   Lungs: Clear   Abdomen: Soft.  Has 4 to 5 cm defect at umbilicus with reducible hernia.  Has pfannenstiel scar.     Lab Results:    Recent Labs  09/13/13 1605 09/14/13 0500  WBC 12.7* 8.8  HGB 12.6 11.7*  HCT 39.6 35.2*  PLT 202 196    BMET   Recent Labs  09/13/13 1605 09/14/13 0500  NA 141 142  K 4.4 4.5  CL 102 104  CO2 23 26  GLUCOSE 114* 104*  BUN 39* 34*  CREATININE 1.86* 1.59*  CALCIUM 9.6 9.0    PT/INR  No results found for this basename: LABPROT, INR,  in the last 72 hours  ABG  No results found for this basename: PHART, PCO2, PO2, HCO3,  in the last 72 hours   Studies/Results:  Ct Abdomen Pelvis Wo  Contrast  09/13/2013   CLINICAL DATA:  Mid to lower abdominal pain with abdominal bloating and nausea.  EXAM: CT ABDOMEN AND PELVIS WITHOUT CONTRAST  TECHNIQUE: Multidetector CT imaging of the abdomen and pelvis was performed following the standard protocol without IV contrast.  COMPARISON:  08/16/2008.  FINDINGS: The colon is mildly dilated, specifically the right colon, with a maximum diameter of 7.8 cm. There is no wall thickening or inflammatory change. A portion of the transverse colon enters a right paraumbilical hernia, without incarceration or strangulation. A mild degree of partial obstruction is possible, given that the colon distal to this is partly decompressed. There are scattered left colon diverticula. No diverticulitis.  Normal small bowel.  Mild stable interstitial thickening at the lung bases. The heart is normal in size.  Liver, spleen, gallbladder, pancreas, adrenal glands: Normal.  Kidneys, ureters and bladder are unremarkable. Normal uterus and adnexa.  Atherosclerotic calcifications are noted along a normal caliber abdominal aorta. No adenopathy. No ascites.  Status post posterior lumbar spine fusion from L2 through S1, with orthopedic hardware well-seated. No change from the prior study. No osteoblastic or osteolytic lesions.  IMPRESSION: 1. Right paraumbilical hernia, which was present previously, now contains a portion of the transverse colon. The colon proximal to this is  dilated to a maximum of 7.8 cm with the colon distal to this being mostly decompressed. This suggests of low grade partial obstruction. No bowel wall thickening or inflammatory changes. No other evidence of an acute abnormality.   Electronically Signed   By: Lajean Manes M.D.   On: 09/13/2013 18:22   Dg Abd 2 Views  09/14/2013   CLINICAL DATA:  Diffuse abdominal pain.  Nausea.  EXAM: ABDOMEN - 2 VIEW  COMPARISON:  CT, 09/13/2013 at 1810 hr  FINDINGS: Residual contrast is noted in the colon. There are air-fluid levels  on the erect view. Right colon appears less dilated than it did on CT scan. There is no free air.  Long lumbar spine fusion is noted.  IMPRESSION: 1. Air-fluid levels on the erect radiograph. Less right colon dilation is noted and was evident on the current CT. No free air. Findings may still reflect a mild degree of right colon obstruction due the to the right paraumbilical hernia. Findings may reflect a mild adynamic ileus, alternatively.   Electronically Signed   By: Lajean Manes M.D.   On: 09/14/2013 02:09     Anti-infectives:   Anti-infectives   None      Alphonsa Overall, MD, FACS Pager: [phone] Surgery Office: [phone] 09/14/2013

## 2013-09-15 DIAGNOSIS — K439 Ventral hernia without obstruction or gangrene: Secondary | ICD-10-CM

## 2013-09-15 DIAGNOSIS — N183 Chronic kidney disease, stage 3 unspecified: Secondary | ICD-10-CM

## 2013-09-15 DIAGNOSIS — N184 Chronic kidney disease, stage 4 (severe): Secondary | ICD-10-CM

## 2013-09-15 LAB — URINE CULTURE

## 2013-09-15 NOTE — Progress Notes (Signed)
General Surgery Note  LOS: 2 days  POD -  1 Day Post-Op  Assessment/Plan: 1.  LAPAROSCOPIC ventral HERNIA REPAIR with mesh  - 09/14/2013 - D[employer] good today.  To advance to regular diet.  If this goes well, she can go home tomorrow AM.  2. Moderate obesity  3. Chronic renal insufficiency   Creat better today  4. HTN 5.  DVT prophylaxis - SQ Heparin   Active Problems:   Partial bowel obstruction   Acute on chronic renal failure   Unspecified essential hypertension  Subjective:  Looks much better than I would have expected.  She is having no pain.  She is taking clear liquids.  Objective:   Filed Vitals:   09/15/13 0538  BP: 147/83  Pulse: 82  Temp: 98.2 F (36.8 C)  Resp: 18     Intake/Output from previous day:  06/05 0701 - 06/06 0700 In: 2000 [I.V.:2000] Out: 915 [Urine:900; Blood:15]  Intake/Output this shift:      Physical Exam:   General: Obese older AA F who is alert and oriented.    HEENT: Normal. Pupils equal. .   Lungs: Clear.   Abdomen: Soft.  Has binder in place.  BS present.   Wound: Clean     Lab Results:    Recent Labs  09/13/13 1605 09/14/13 0500  WBC 12.7* 8.8  HGB 12.6 11.7*  HCT 39.6 35.2*  PLT 202 196    BMET   Recent Labs  09/13/13 1605 09/14/13 0500  NA 141 142  K 4.4 4.5  CL 102 104  CO2 23 26  GLUCOSE 114* 104*  BUN 39* 34*  CREATININE 1.86* 1.59*  CALCIUM 9.6 9.0    PT/INR  No results found for this basename: LABPROT, INR,  in the last 72 hours  ABG  No results found for this basename: PHART, PCO2, PO2, HCO3,  in the last 72 hours   Studies/Results:  Ct Abdomen Pelvis Wo Contrast  09/13/2013   CLINICAL DATA:  Mid to lower abdominal pain with abdominal bloating and nausea.  EXAM: CT ABDOMEN AND PELVIS WITHOUT CONTRAST  TECHNIQUE: Multidetector CT imaging of the abdomen and pelvis was performed following the standard protocol without IV contrast.  COMPARISON:  08/16/2008.  FINDINGS: The colon is mildly  dilated, specifically the right colon, with a maximum diameter of 7.8 cm. There is no wall thickening or inflammatory change. A portion of the transverse colon enters a right paraumbilical hernia, without incarceration or strangulation. A mild degree of partial obstruction is possible, given that the colon distal to this is partly decompressed. There are scattered left colon diverticula. No diverticulitis.  Normal small bowel.  Mild stable interstitial thickening at the lung bases. The heart is normal in size.  Liver, spleen, gallbladder, pancreas, adrenal glands: Normal.  Kidneys, ureters and bladder are unremarkable. Normal uterus and adnexa.  Atherosclerotic calcifications are noted along a normal caliber abdominal aorta. No adenopathy. No ascites.  Status post posterior lumbar spine fusion from L2 through S1, with orthopedic hardware well-seated. No change from the prior study. No osteoblastic or osteolytic lesions.  IMPRESSION: 1. Right paraumbilical hernia, which was present previously, now contains a portion of the transverse colon. The colon proximal to this is dilated to a maximum of 7.8 cm with the colon distal to this being mostly decompressed. This suggests of low grade partial obstruction. No bowel wall thickening or inflammatory changes. No other evidence of an acute abnormality.   Electronically Signed  By: Lajean Manes M.D.   On: 09/13/2013 18:22   Dg Abd 2 Views  09/14/2013   CLINICAL DATA:  Diffuse abdominal pain.  Nausea.  EXAM: ABDOMEN - 2 VIEW  COMPARISON:  CT, 09/13/2013 at 1810 hr  FINDINGS: Residual contrast is noted in the colon. There are air-fluid levels on the erect view. Right colon appears less dilated than it did on CT scan. There is no free air.  Long lumbar spine fusion is noted.  IMPRESSION: 1. Air-fluid levels on the erect radiograph. Less right colon dilation is noted and was evident on the current CT. No free air. Findings may still reflect a mild degree of right colon  obstruction due the to the right paraumbilical hernia. Findings may reflect a mild adynamic ileus, alternatively.   Electronically Signed   By: Lajean Manes M.D.   On: 09/14/2013 02:09     Anti-infectives:   Anti-infectives   Start     Dose/Rate Route Frequency Ordered Stop   09/14/13 1115  ceFAZolin (ANCEF) IVPB 2 g/50 mL premix     2 g 100 mL/hr over 30 Minutes Intravenous  Once 09/14/13 1109 09/14/13 1128      Alphonsa Overall, MD, FACS Pager: [place] [facility] Office: [phone] 09/15/2013

## 2013-09-15 NOTE — ED Provider Notes (Signed)
Medical screening examination/treatment/procedure(s) were conducted as a shared visit with non-physician practitioner(s) and myself.  I personally evaluated the patient during the encounter.   Pt c/o feeling bloated, mild nausea. Hernia reduced, recheck abd soft nt.   Mirna Mires, MD 09/15/13 [phone]

## 2013-09-15 NOTE — Progress Notes (Signed)
[facility] PROGRESS NOTE  [name] [id number] DOB: [birth year] DOA: 09/13/2013 PCP: [name], MD  Assessment/Plan: PSBO 2/2 Ventral Hernia -s/p repair by surgery 6/5. -Advancing diet.  Acute on CKD Stage III -Baseline Cr from 2011 1.2. -Cr is 1.5 today. -May have an acute component, but suspect this is a new baseline for her. -Cr down from 1.8 on admission.  HTN -Good control.  Code Status: Full Code Family Communication: Patient only  Disposition Plan: Home when ready; likely in am.   Consultants:  Surgery   Antibiotics:  None   Subjective: No complaints. Feels well.  Objective: Filed Vitals:   09/14/13 1847 09/14/13 1907 09/14/13 2042 09/15/13 0538  BP: 171/71 139/70 125/75 147/83  Pulse: 74 76  82  Temp: 98.1 F (36.7 C) 98.3 F (36.8 C)  98.2 F (36.8 C)  TempSrc: Oral Oral  Oral  Resp: 17 16  18   Height:      Weight:      SpO2: 95% 97% 96% 96%    Intake/Output Summary (Last 24 hours) at 09/15/13 1212 Last data filed at 09/15/13 0539  Gross per 24 hour  Intake   1000 ml  Output    715 ml  Net    285 ml   Filed Weights   09/13/13 2000  Weight: 90.765 kg (200 lb 1.6 oz)    Exam:   General:  AA Ox3  Cardiovascular: RRR  Respiratory: CTA B  Abdomen: S/NT/ND/+BS/obese  Extremities: no C/C/E   Neurologic:  Non-focal.  Data Reviewed: Basic Metabolic Panel:  Recent Labs Lab 09/13/13 1605 09/14/13 0500  NA 141 142  K 4.4 4.5  CL 102 104  CO2 23 26  GLUCOSE 114* 104*  BUN 39* 34*  CREATININE 1.86* 1.59*  CALCIUM 9.6 9.0   Liver Function Tests:  Recent Labs Lab 09/13/13 1605  AST 18  ALT 12  ALKPHOS 142*  BILITOT <0.2*  PROT 8.1  ALBUMIN 4.1   No results found for this basename: LIPASE, AMYLASE,  in the last 168 hours No results found for this basename: AMMONIA,  in the last 168 hours CBC:  Recent Labs Lab 09/13/13 1605 09/14/13 0500  WBC 12.7* 8.8  NEUTROABS 9.4*  --   HGB 12.6  11.7*  HCT 39.6 35.2*  MCV 89.4 88.2  PLT 202 196   Cardiac Enzymes: No results found for this basename: CKTOTAL, CKMB, CKMBINDEX, TROPONINI,  in the last 168 hours BNP (last 3 results) No results found for this basename: PROBNP,  in the last 8760 hours CBG: No results found for this basename: GLUCAP,  in the last 168 hours  Recent Results (from the past 240 hour(s))  URINE CULTURE     Status: None   Collection Time    09/13/13  4:33 PM      Result Value Ref Range Status   Specimen Description URINE, CLEAN CATCH   Final   Special Requests NONE   Final   Culture  Setup Time     Final   Value: 09/14/2013 00:20     Performed at [facility]     Final   Value: 20,OOO COLONIES/ML     Performed at [facility]   Culture     Final   Value: Multiple bacterial morphotypes present, none predominant. Suggest appropriate recollection if clinically indicated.     Performed at [facility]   Report Status 09/15/2013 FINAL   Final  SURGICAL PCR SCREEN     Status: None   Collection Time    09/14/13  9:24 AM      Result Value Ref Range Status   MRSA, PCR NEGATIVE  NEGATIVE Final   Staphylococcus aureus NEGATIVE  NEGATIVE Final   Comment:            The Xpert SA Assay (FDA     approved for NASAL specimens     in patients over 57 years of age),     is one component of     a comprehensive surveillance     program.  Test performance has     been validated by [employer] for patients greater     than or equal to 14 year old.     It is not intended     to diagnose infection nor to     guide or monitor treatment.     Studies: Ct Abdomen Pelvis Wo Contrast  09/13/2013   CLINICAL DATA:  Mid to lower abdominal pain with abdominal bloating and nausea.  EXAM: CT ABDOMEN AND PELVIS WITHOUT CONTRAST  TECHNIQUE: Multidetector CT imaging of the abdomen and pelvis was performed following the standard protocol without IV contrast.  COMPARISON:  08/16/2008.   FINDINGS: The colon is mildly dilated, specifically the right colon, with a maximum diameter of 7.8 cm. There is no wall thickening or inflammatory change. A portion of the transverse colon enters a right paraumbilical hernia, without incarceration or strangulation. A mild degree of partial obstruction is possible, given that the colon distal to this is partly decompressed. There are scattered left colon diverticula. No diverticulitis.  Normal small bowel.  Mild stable interstitial thickening at the lung bases. The heart is normal in size.  Liver, spleen, gallbladder, pancreas, adrenal glands: Normal.  Kidneys, ureters and bladder are unremarkable. Normal uterus and adnexa.  Atherosclerotic calcifications are noted along a normal caliber abdominal aorta. No adenopathy. No ascites.  Status post posterior lumbar spine fusion from L2 through S1, with orthopedic hardware well-seated. No change from the prior study. No osteoblastic or osteolytic lesions.  IMPRESSION: 1. Right paraumbilical hernia, which was present previously, now contains a portion of the transverse colon. The colon proximal to this is dilated to a maximum of 7.8 cm with the colon distal to this being mostly decompressed. This suggests of low grade partial obstruction. No bowel wall thickening or inflammatory changes. No other evidence of an acute abnormality.   Electronically Signed   By: Lajean Manes M.D.   On: 09/13/2013 18:22   Dg Abd 2 Views  09/14/2013   CLINICAL DATA:  Diffuse abdominal pain.  Nausea.  EXAM: ABDOMEN - 2 VIEW  COMPARISON:  CT, 09/13/2013 at 1810 hr  FINDINGS: Residual contrast is noted in the colon. There are air-fluid levels on the erect view. Right colon appears less dilated than it did on CT scan. There is no free air.  Long lumbar spine fusion is noted.  IMPRESSION: 1. Air-fluid levels on the erect radiograph. Less right colon dilation is noted and was evident on the current CT. No free air. Findings may still reflect a  mild degree of right colon obstruction due the to the right paraumbilical hernia. Findings may reflect a mild adynamic ileus, alternatively.   Electronically Signed   By: Lajean Manes M.D.   On: 09/14/2013 02:09    Scheduled Meds: . antiseptic oral rinse  15 mL Mouth Rinse q12n4p  . chlorhexidine  15 mL Mouth Rinse BID  . heparin  5,000 Units Subcutaneous 3 times per day  . metoprolol  2.5 mg Intravenous 4 times per day   Continuous Infusions: . dextrose 5 % and 0.9% NaCl Stopped (09/14/13 0943)    Principal Problem:   Partial bowel obstruction Active Problems:   Ventral hernia   Acute on chronic renal failure   Unspecified essential hypertension   CKD (chronic kidney disease) stage 3, GFR 30-59 ml/min    Time spent: 35 minutes. Greater than 50% of this time was spent in direct contact with the patient coordinating care.    [facility] Pager [phone]  If 7PM-7AM, please contact night-coverage at [email or web address], password [facility] 09/15/2013, 12:12 PM  LOS: 2 days

## 2013-09-16 LAB — BASIC METABOLIC PANEL
BUN: 36 mg/dL — AB (ref 6–23)
CHLORIDE: 104 meq/L (ref 96–112)
CO2: 27 mEq/L (ref 19–32)
Calcium: 9.2 mg/dL (ref 8.4–10.5)
Creatinine, Ser: 1.73 mg/dL — ABNORMAL HIGH (ref 0.50–1.10)
GFR calc Af Amer: 29 mL/min — ABNORMAL LOW (ref >90)
GFR calc non Af Amer: 25 mL/min — ABNORMAL LOW (ref >90)
Glucose, Bld: 94 mg/dL (ref 70–99)
Potassium: 5.2 mEq/L (ref 3.7–5.3)
Sodium: 142 mEq/L (ref 137–147)

## 2013-09-16 NOTE — Progress Notes (Signed)
General Surgery Note  LOS: 3 days  POD -  2 Days Post-Op  Assessment/Plan: 1.  LAPAROSCOPIC ventral HERNIA REPAIR with mesh  - 09/14/2013 - [employer] good.  She can go home.  See me back in 2 to 4 weeks.  2. Moderate obesity  3. Chronic renal insufficiency   Creat up a little. 4. HTN 5.  DVT prophylaxis - SQ Heparin   Principal Problem:   Partial bowel obstruction Active Problems:   Acute on chronic renal failure   Unspecified essential hypertension   Ventral hernia   CKD (chronic kidney disease) stage 3, GFR 30-59 ml/min  Subjective:  Ready to go home.    Objective:   Filed Vitals:   09/16/13 0605  BP: 138/74  Pulse: 64  Temp: 97.9 F (36.6 C)  Resp: 18     Intake/Output from previous day:  06/06 0701 - 06/07 0700 In: 720 [P.O.:720] Out: 600 [Urine:600]  Intake/Output this shift:      Physical Exam:   General: Obese older AA F who is alert and oriented.    HEENT: Normal. Pupils equal. .   Lungs: Clear.   Abdomen: Soft.   BS present.   Wound: Incisions all look good.    Lab Results:     Recent Labs  09/13/13 1605 09/14/13 0500  WBC 12.7* 8.8  HGB 12.6 11.7*  HCT 39.6 35.2*  PLT 202 196    BMET    Recent Labs  09/14/13 0500 09/16/13 0520  NA 142 142  K 4.5 5.2  CL 104 104  CO2 26 27  GLUCOSE 104* 94  BUN 34* 36*  CREATININE 1.59* 1.73*  CALCIUM 9.0 9.2    PT/INR  No results found for this basename: LABPROT, INR,  in the last 72 hours  ABG  No results found for this basename: PHART, PCO2, PO2, HCO3,  in the last 72 hours   Studies/Results:  No results found.   Anti-infectives:   Anti-infectives   Start     Dose/Rate Route Frequency Ordered Stop   09/14/13 1115  ceFAZolin (ANCEF) IVPB 2 g/50 mL premix     2 g 100 mL/hr over 30 Minutes Intravenous  Once 09/14/13 1109 09/14/13 1128      Alphonsa Overall, MD, FACS Pager: [facility] Office: [phone] 09/16/2013

## 2013-09-16 NOTE — Discharge Summary (Signed)
Physician Discharge Summary  [name] [id number] DOB: [birth year] DOA: 09/13/2013  PCP: [name], MD  Admit date: 09/13/2013 Discharge date: 09/16/2013  Time spent: 45 minutes  Recommendations for Outpatient Follow-up:  -Will be discharged home today. -Advised to follow up with PCP in 10 days. -Will need a BMET at time of follow up given rising Cr. Diovan/HCT has been held for now. -Follow up with surgery as scheduled by them.   Discharge Diagnoses:  Principal Problem:   Partial bowel obstruction Active Problems:   Ventral hernia   Acute on chronic renal failure   Unspecified essential hypertension   CKD (chronic kidney disease) stage 3, GFR 30-59 ml/min   Discharge Condition: Stable and improved  Filed Weights   09/13/13 2000  Weight: 90.765 kg (200 lb 1.6 oz)    History of present illness:  80 year old female history of hypertension, periumbilical hernia, CKD stage III presents with one-day history of worsening abdominal pain. The patient has had a history of a periumbilical hernia for many years. She states that she has not had any problems with that. However in the past few days she has noted that the hernia has become slightly larger and more painful. She denied any fevers, chills, chest pain, shortness of breath, vomiting, diarrhea, hematochezia, melena. She has had some nausea. She states that she had a bowel movement in the emergency department. Normally the patient struggles with constipation and has approximately 3 bowel movements per week. The patient occasionally takes cathartics to help her with bowel movements. In the emergency department, CT of the abdomen and pelvis revealed a mildly dilated right colon up to 7.8 cm with a portion of the transverse colon entering the right periumbilical hernia without incarceration or strangulation. There was concern for a mild degree of obstruction. General surgery was consulted. They did not feel that the patient  needed emergent surgery and could be managed medically. As a result, they requested medical admission. WBC was 12.7. Serum creatinine 1.6. Hepatic panel was unremarkable. Urinalysis was negative for pyuria.   Hospital Course:   PSBO 2/2 Ventral Hernia  -s/p repair by surgery 6/5.  -Tolerating solid diet without issues.  Acute on CKD Stage III  -Baseline Cr from 2011 1.2.  -Cr is 1.7 on DC.  -May have an acute component, but suspect this is a new baseline for her.  -Cr down from 1.8 on admission.  -Will hold ACE-I/HCTZ for now until seen in follow up by her PCP.  HTN  -Good control.      Consultations:  Surgery  Discharge Instructions  Discharge Instructions   Diet - low sodium heart healthy    Complete by:  As directed      Discontinue IV    Complete by:  As directed      Increase activity slowly    Complete by:  As directed             Medication List    STOP taking these medications       valsartan-hydrochlorothiazide 160-12.5 MG per tablet  Commonly known as:  DIOVAN-HCT      TAKE these medications       aspirin EC 81 MG tablet  Take 81 mg by mouth every morning.     felodipine 10 MG 24 hr tablet  Commonly known as:  PLENDIL  Take 10 mg by mouth every morning.     nabumetone 500 MG tablet  Commonly known as:  RELAFEN  Take 500  mg by mouth every morning.     traMADol 50 MG tablet  Commonly known as:  ULTRAM  Take 50 mg by mouth every 6 (six) hours as needed for moderate pain.       No Known Allergies     Follow-up Information   Follow up with [name], MD. Schedule an appointment as soon as possible for a visit in 10 days.   Specialty:  Internal Medicine   Contact information:   [address] [place] [address] [phone]        The results of significant diagnostics from this hospitalization (including imaging, microbiology, ancillary and laboratory) are listed below for reference.    Significant  Diagnostic Studies: Ct Abdomen Pelvis Wo Contrast  09/13/2013   CLINICAL DATA:  Mid to lower abdominal pain with abdominal bloating and nausea.  EXAM: CT ABDOMEN AND PELVIS WITHOUT CONTRAST  TECHNIQUE: Multidetector CT imaging of the abdomen and pelvis was performed following the standard protocol without IV contrast.  COMPARISON:  08/16/2008.  FINDINGS: The colon is mildly dilated, specifically the right colon, with a maximum diameter of 7.8 cm. There is no wall thickening or inflammatory change. A portion of the transverse colon enters a right paraumbilical hernia, without incarceration or strangulation. A mild degree of partial obstruction is possible, given that the colon distal to this is partly decompressed. There are scattered left colon diverticula. No diverticulitis.  Normal small bowel.  Mild stable interstitial thickening at the lung bases. The heart is normal in size.  Liver, spleen, gallbladder, pancreas, adrenal glands: Normal.  Kidneys, ureters and bladder are unremarkable. Normal uterus and adnexa.  Atherosclerotic calcifications are noted along a normal caliber abdominal aorta. No adenopathy. No ascites.  Status post posterior lumbar spine fusion from L2 through S1, with orthopedic hardware well-seated. No change from the prior study. No osteoblastic or osteolytic lesions.  IMPRESSION: 1. Right paraumbilical hernia, which was present previously, now contains a portion of the transverse colon. The colon proximal to this is dilated to a maximum of 7.8 cm with the colon distal to this being mostly decompressed. This suggests of low grade partial obstruction. No bowel wall thickening or inflammatory changes. No other evidence of an acute abnormality.   Electronically Signed   By: Lajean Manes M.D.   On: 09/13/2013 18:22   Dg Abd 2 Views  09/14/2013   CLINICAL DATA:  Diffuse abdominal pain.  Nausea.  EXAM: ABDOMEN - 2 VIEW  COMPARISON:  CT, 09/13/2013 at 1810 hr  FINDINGS: Residual contrast is noted  in the colon. There are air-fluid levels on the erect view. Right colon appears less dilated than it did on CT scan. There is no free air.  Long lumbar spine fusion is noted.  IMPRESSION: 1. Air-fluid levels on the erect radiograph. Less right colon dilation is noted and was evident on the current CT. No free air. Findings may still reflect a mild degree of right colon obstruction due the to the right paraumbilical hernia. Findings may reflect a mild adynamic ileus, alternatively.   Electronically Signed   By: Lajean Manes M.D.   On: 09/14/2013 02:09    Microbiology: Recent Results (from the past 240 hour(s))  URINE CULTURE     Status: None   Collection Time    09/13/13  4:33 PM      Result Value Ref Range Status   Specimen Description URINE, CLEAN CATCH   Final   Special Requests NONE   Final  Culture  Setup Time     Final   Value: 09/14/2013 00:20     Performed at [facility]     Final   Value: 20,OOO COLONIES/ML     Performed at [facility]   Culture     Final   Value: Multiple bacterial morphotypes present, none predominant. Suggest appropriate recollection if clinically indicated.     Performed at [facility]   Report Status 09/15/2013 FINAL   Final  SURGICAL PCR SCREEN     Status: None   Collection Time    09/14/13  9:24 AM      Result Value Ref Range Status   MRSA, PCR NEGATIVE  NEGATIVE Final   Staphylococcus aureus NEGATIVE  NEGATIVE Final   Comment:            The Xpert SA Assay (FDA     approved for NASAL specimens     in patients over 59 years of age),     is one component of     a comprehensive surveillance     program.  Test performance has     been validated by [employer] for patients greater     than or equal to 22 year old.     It is not intended     to diagnose infection nor to     guide or monitor treatment.     Labs: Basic Metabolic Panel:  Recent Labs Lab 09/13/13 1605 09/14/13 0500 09/16/13 0520    NA 141 142 142  K 4.4 4.5 5.2  CL 102 104 104  CO2 23 26 27   GLUCOSE 114* 104* 94  BUN 39* 34* 36*  CREATININE 1.86* 1.59* 1.73*  CALCIUM 9.6 9.0 9.2   Liver Function Tests:  Recent Labs Lab 09/13/13 1605  AST 18  ALT 12  ALKPHOS 142*  BILITOT <0.2*  PROT 8.1  ALBUMIN 4.1   No results found for this basename: LIPASE, AMYLASE,  in the last 168 hours No results found for this basename: AMMONIA,  in the last 168 hours CBC:  Recent Labs Lab 09/13/13 1605 09/14/13 0500  WBC 12.7* 8.8  NEUTROABS 9.4*  --   HGB 12.6 11.7*  HCT 39.6 35.2*  MCV 89.4 88.2  PLT 202 196   Cardiac Enzymes: No results found for this basename: CKTOTAL, CKMB, CKMBINDEX, TROPONINI,  in the last 168 hours BNP: BNP (last 3 results) No results found for this basename: PROBNP,  in the last 8760 hours CBG: No results found for this basename: GLUCAP,  in the last 168 hours     Signed:  [facility] Pager: [phone] 09/16/2013, 1:51 PM

## 2013-09-17 ENCOUNTER — Encounter (HOSPITAL_COMMUNITY): Payer: Self-pay | Admitting: Surgery

## 2013-10-03 ENCOUNTER — Encounter (INDEPENDENT_AMBULATORY_CARE_PROVIDER_SITE_OTHER): Payer: Self-pay | Admitting: Surgery

## 2013-10-03 ENCOUNTER — Ambulatory Visit (INDEPENDENT_AMBULATORY_CARE_PROVIDER_SITE_OTHER): Payer: Medicare Other | Admitting: Surgery

## 2013-10-03 VITALS — BP 126/76 | HR 90 | Temp 98.5°F | Resp 18 | Ht 64.0 in | Wt 197.0 lb

## 2013-10-03 DIAGNOSIS — K436 Other and unspecified ventral hernia with obstruction, without gangrene: Secondary | ICD-10-CM

## 2013-10-03 NOTE — Progress Notes (Signed)
[place], Clements,  Rebecca Selz.,  [name], [name]    [name] Phone:  [phone] FAX:  [phone]   Re:   [name] DOB:   [birth year] MRN:   [id number]  ASSESSMENT AND PLAN: 1. Laparoscopic ventral hernia repair - 09/14/2013 - [name]  Has done very well.  I have encouraged her to wear the abdominal binder until one month from surgery.  Return appt to me PRN.   2.  Moderate obesity  3.  Chronic renal insufficiency  4.  HTN 5.  Gout - on meds per [name]  HISTORY OF PRESENT ILLNESS: Chief Complaint  Patient presents with  . Routine Post Op    vent. hernia    [name] is a 80 y.o. (DOB: 1923-05-19)  AA  female who is a patient of [name] and comes to me today for follow up for ventral hernia repair. Her daughter is with her. She has done better than I would have expected.  Sore from the surgery, but active.  Past Medical History  Diagnosis Date  . Cervical spondylosis   . Breast mass   . Hypertension     SOCIAL HISTORY: Daughter is with her.  [name].  PHYSICAL EXAM: BP 126/76  Pulse 90  Temp(Src) 98.5 F (36.9 C)  Resp 18  Ht 5\' 4"  (1.626 m)  Wt 197 lb (89.359 kg)  BMI 33.80 kg/m2  Abdomen:  Soft.  Some bruising.  Wound looks good.  BS present.  DATA REVIEWED: None new.  Rebecca Overall, Clements,  [facility], [address]  [place], [place]    [place] Phone:  [phone] FAX:  [phone]

## 2013-10-17 ENCOUNTER — Ambulatory Visit
Admission: RE | Admit: 2013-10-17 | Discharge: 2013-10-17 | Disposition: A | Discharge status: Home / Self Care | Payer: Medicare Other | Source: Ambulatory Visit | Attending: Internal Medicine | Admitting: Internal Medicine

## 2013-10-17 DIAGNOSIS — N63 Unspecified lump in unspecified breast: Secondary | ICD-10-CM

## 2013-10-17 DIAGNOSIS — R921 Mammographic calcification found on diagnostic imaging of breast: Secondary | ICD-10-CM

## 2014-03-27 ENCOUNTER — Other Ambulatory Visit: Payer: Self-pay | Admitting: Internal Medicine

## 2014-03-27 DIAGNOSIS — R921 Mammographic calcification found on diagnostic imaging of breast: Secondary | ICD-10-CM

## 2014-03-27 DIAGNOSIS — N6310 Unspecified lump in the right breast, unspecified quadrant: Secondary | ICD-10-CM

## 2014-04-09 ENCOUNTER — Other Ambulatory Visit: Payer: Self-pay

## 2014-04-09 ENCOUNTER — Other Ambulatory Visit: Payer: Self-pay | Admitting: Internal Medicine

## 2014-04-09 DIAGNOSIS — R921 Mammographic calcification found on diagnostic imaging of breast: Secondary | ICD-10-CM

## 2014-04-09 DIAGNOSIS — N6310 Unspecified lump in the right breast, unspecified quadrant: Secondary | ICD-10-CM

## 2014-04-16 ENCOUNTER — Ambulatory Visit
Admission: RE | Admit: 2014-04-16 | Discharge: 2014-04-16 | Disposition: A | Discharge status: Home / Self Care | Payer: Medicare Other | Source: Ambulatory Visit | Attending: Internal Medicine | Admitting: Internal Medicine

## 2014-04-16 DIAGNOSIS — N6310 Unspecified lump in the right breast, unspecified quadrant: Secondary | ICD-10-CM

## 2014-04-16 DIAGNOSIS — R921 Mammographic calcification found on diagnostic imaging of breast: Secondary | ICD-10-CM

## 2014-05-27 ENCOUNTER — Ambulatory Visit: Payer: Medicare Other | Admitting: Podiatry

## 2014-06-04 ENCOUNTER — Ambulatory Visit (INDEPENDENT_AMBULATORY_CARE_PROVIDER_SITE_OTHER): Payer: Medicare Other | Admitting: Podiatry

## 2014-06-04 ENCOUNTER — Encounter: Payer: Self-pay | Admitting: Podiatry

## 2014-06-04 VITALS — BP 123/76 | HR 96 | Resp 16 | Ht 64.0 in | Wt 180.0 lb

## 2014-06-04 DIAGNOSIS — M79673 Pain in unspecified foot: Secondary | ICD-10-CM

## 2014-06-04 DIAGNOSIS — L600 Ingrowing nail: Secondary | ICD-10-CM

## 2014-06-04 MED ORDER — NEOMYCIN-POLYMYXIN-HC 3.5-10000-1 OT SOLN: OTIC | Status: DC

## 2014-06-04 NOTE — Progress Notes (Signed)
   Subjective:    Patient ID: [name], female    DOB: Dec 12, 1923, 81 y.o.   MRN: [id number]  HPI Comments: Right great toenail has been bothering me for some time now. Across the top of the toenail along the lateral corner of the nail painful . Soaked it and put neosporin on it      Review of Systems  All other systems reviewed and are negative.      Objective:   Physical Exam: I have reviewed her past medical history medications allergies surgery social history and review of systems. Pulses are strongly palpable. Neurologic sensorium is intact percent lasting monofilament. Deep tendon reflexes are intact bilateral. Muscle strength is 5 over 5 dorsiflexion plantar flexors and inverters everters all intrinsic musculature is intact. Orthopedic evaluation demonstrates all joints distal to the angle full range of motion without crepitation. Cutaneous evaluation demonstrates sharp incurvated nail margin along the fibular border of the hallux right with erythema and edema and mild purulence. No odor noted.        Assessment & Plan:  Assessment: Ingrown toenail paronychia Says hallux right.  Plan: Chemical matrixectomy was performed today along the fibular border of the hallux right after local anesthetic was administered. The nail split from distal to proximal along the fibular border avulsed exposing the matrix of the nailbed. 3 applications of phenol were applied 30 seconds each and was neutralized with isopropyl alcohol. Silvadene cream and a dry sterile compressive dressing was applied. She was given both oral and written home-going instructions for soaking therapy of her toe. She was also given a prescription for Cortisporin Otic which she will apply twice daily after soaking. I will follow-up with her in 1 week.

## 2014-06-04 NOTE — Patient Instructions (Signed)

## 2014-06-11 ENCOUNTER — Ambulatory Visit (INDEPENDENT_AMBULATORY_CARE_PROVIDER_SITE_OTHER): Payer: Medicare Other | Admitting: Podiatry

## 2014-06-11 ENCOUNTER — Encounter: Payer: Self-pay | Admitting: Podiatry

## 2014-06-11 VITALS — BP 166/82 | HR 91 | Resp 14

## 2014-06-11 DIAGNOSIS — L600 Ingrowing nail: Secondary | ICD-10-CM

## 2014-06-11 NOTE — Progress Notes (Signed)
She presents today for follow-up of matrixectomy fibular border hallux right. She states that is doing great. She continues to soak twice daily Betadine warm water.  Objective: Vital signs are stable she's alert and oriented 3. There is no erythema edema cellulitis drainage or odor.  Assessment: Well-healed surgical foot right.  Plan: Discontinue Betadine start with Epson salt warm water soaks covered in the daily Lopid at night. Follow-up with me if this does not go on to heal completely.

## 2014-06-25 IMAGING — CR DG ABDOMEN 2V
2 series · 2 of 2 positions shown · non-contrast
Comparison: CT, 09/13/2013 at 9894 hr

CLINICAL DATA: Diffuse abdominal pain.  Nausea.

EXAM:
ABDOMEN - 2 VIEW

[w abdomen upright *]
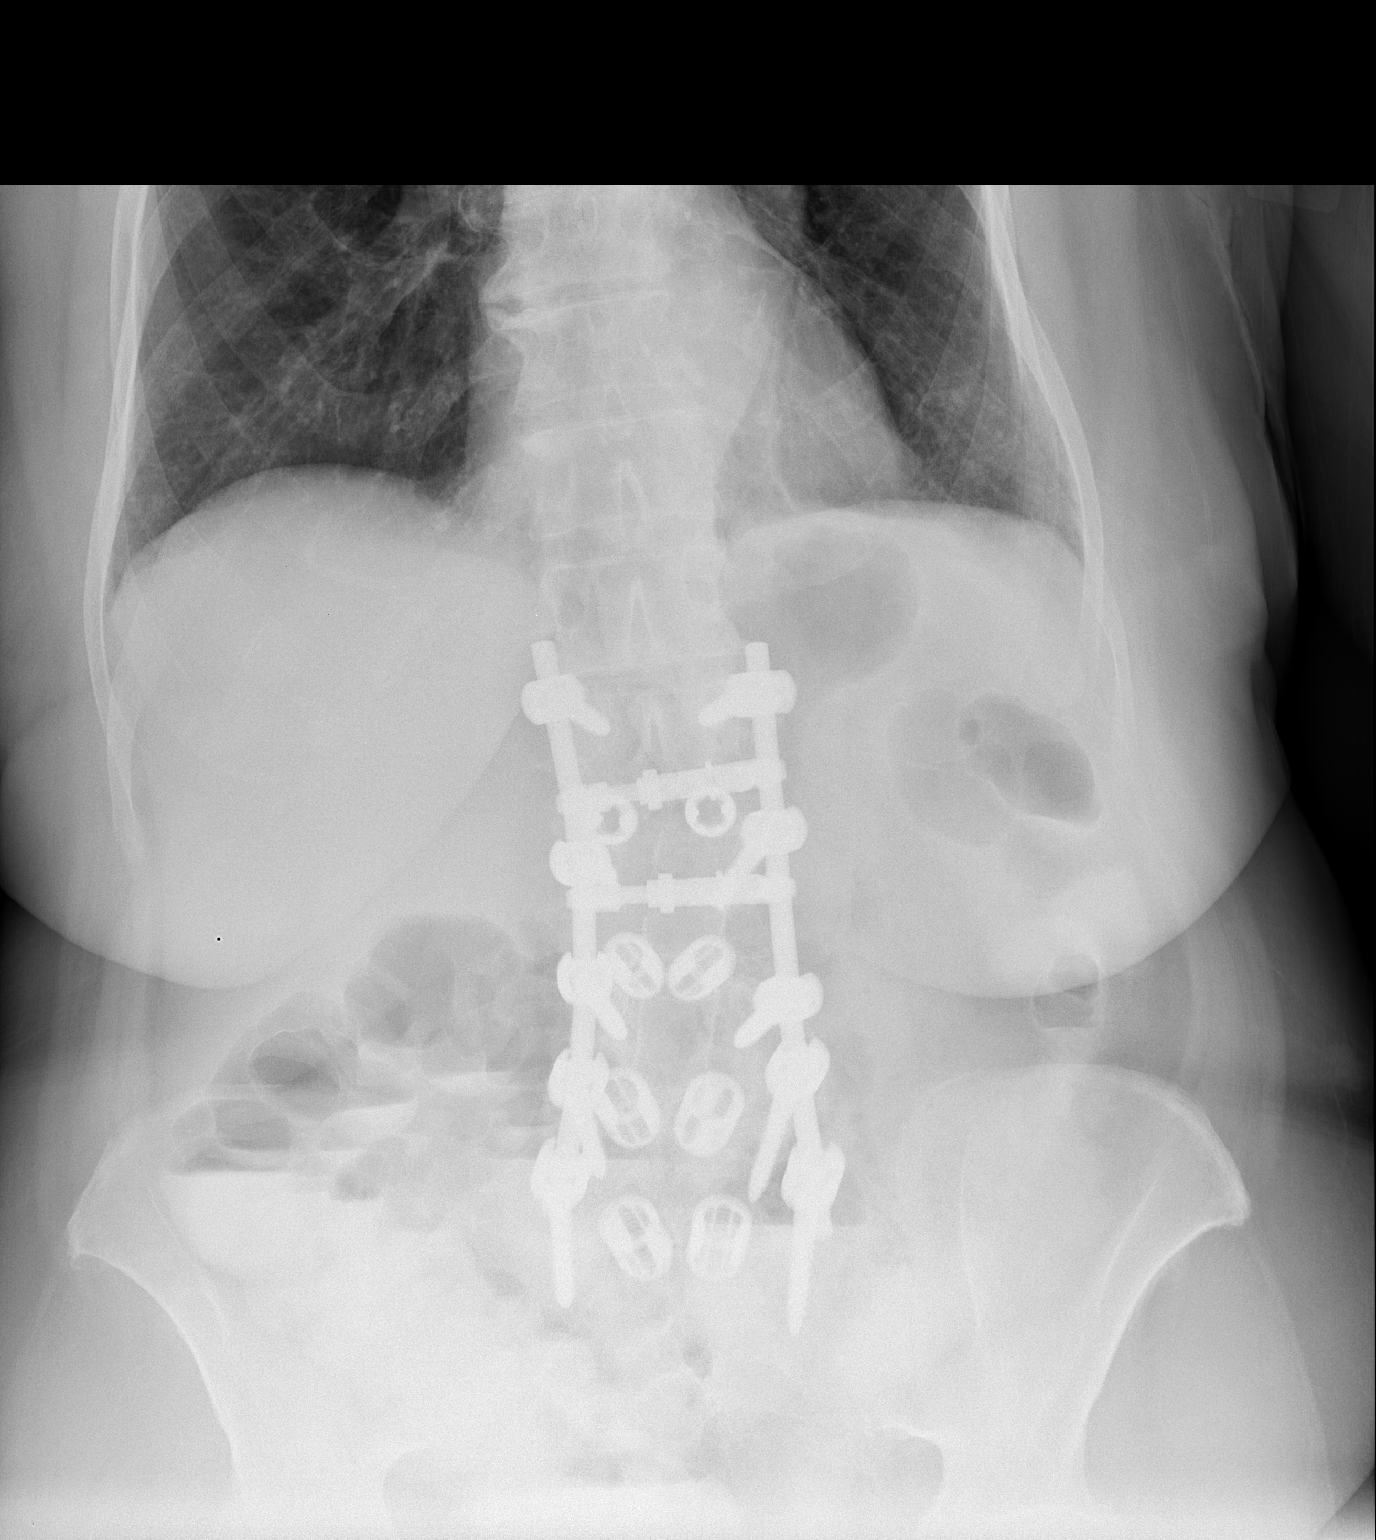

[t abdomen supine]
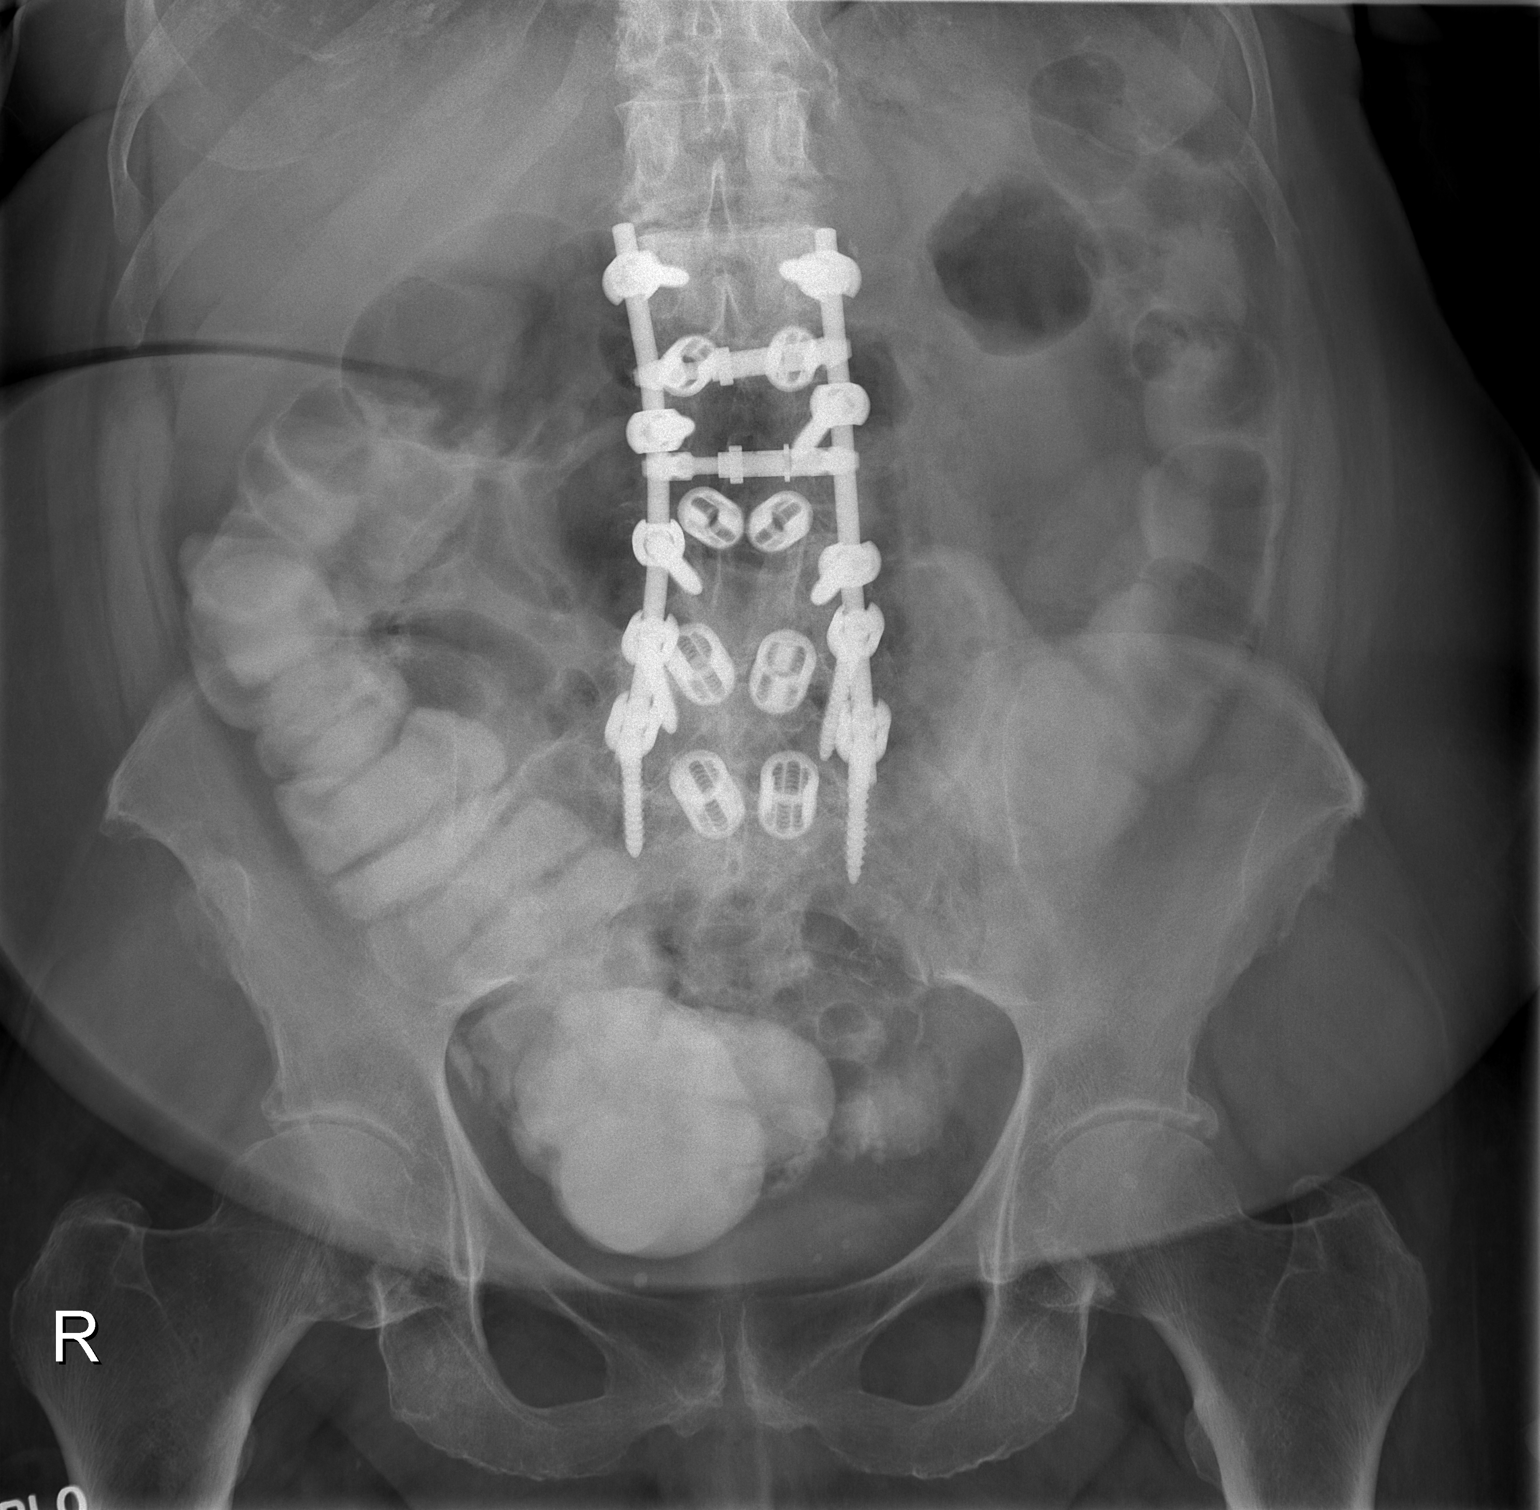

[2 of 2 positions shown; findings below may reference images not displayed]

FINDINGS: Residual contrast is noted in the colon. There are air-fluid levels
on the erect view. Right colon appears less dilated than it did on
CT scan. There is no free air.

Long lumbar spine fusion is noted.
IMPRESSION: 1. Air-fluid levels on the erect radiograph. Less right colon
dilation is noted and was evident on the current CT. No free air.
Findings may still reflect a mild degree of right colon obstruction
due the to the right paraumbilical hernia. Findings may reflect a
mild adynamic ileus, alternatively.

## 2014-06-25 IMAGING — CT CT ABD-PELV W/O CM
1 of 2 series · 15 of 32 positions shown, 19 images · non-contrast
Comparison: 08/16/2008.

CLINICAL DATA: Mid to lower abdominal pain with abdominal bloating
and nausea.

EXAM:
CT ABDOMEN AND PELVIS WITHOUT CONTRAST
TECHNIQUE: Multidetector CT imaging of the abdomen and pelvis was performed
following the standard protocol without IV contrast.

[Series 2: abd/pel w/o · axial · non-contrast · 0.79mm/px · z∈[-692,-302]mm · 15 of 86 slices shown, 19 images]
[im 4/86  soft-tissue]
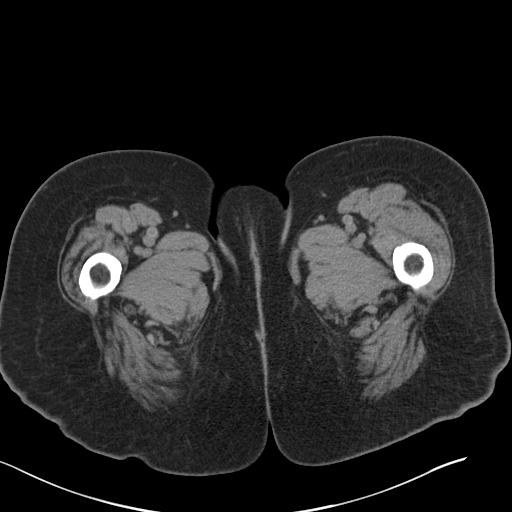
[im 4/86  bone]
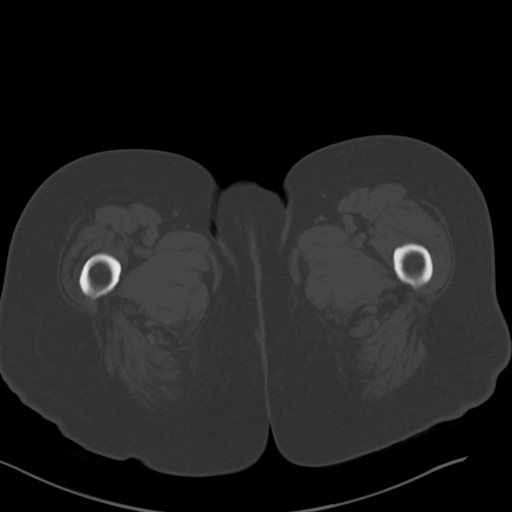
[im 11/86  soft-tissue]
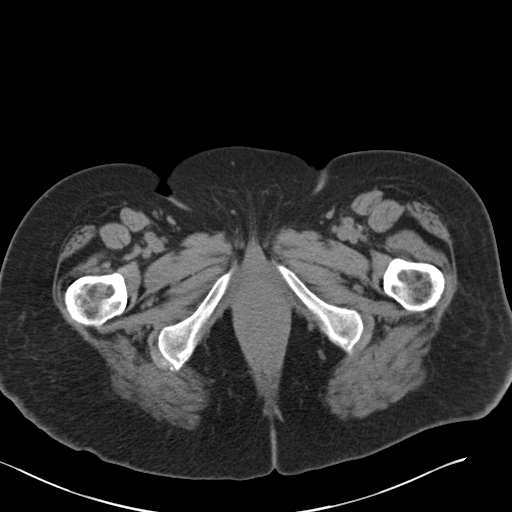
[im 18/86  soft-tissue]
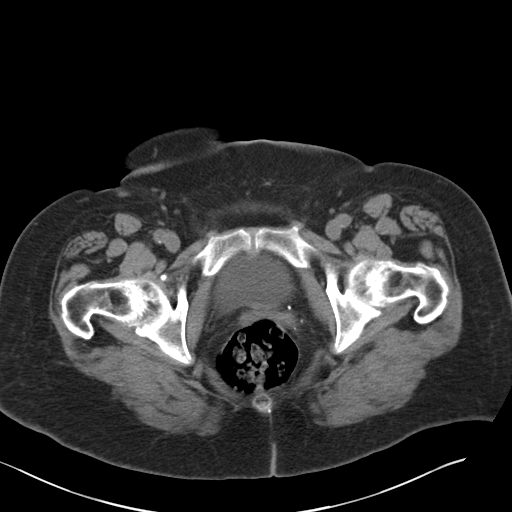
[im 25/86  soft-tissue]
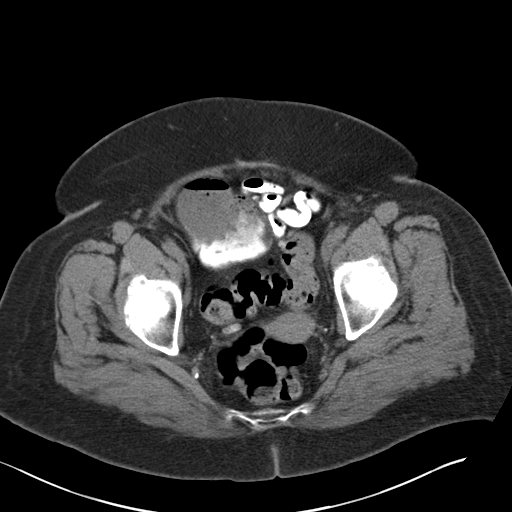
[im 29/86  soft-tissue]
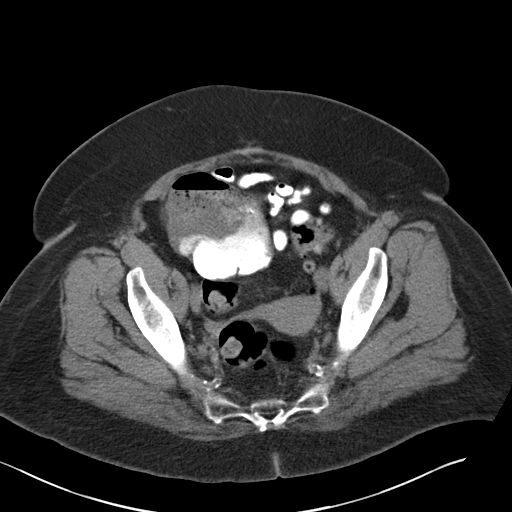
[im 36/86  soft-tissue]
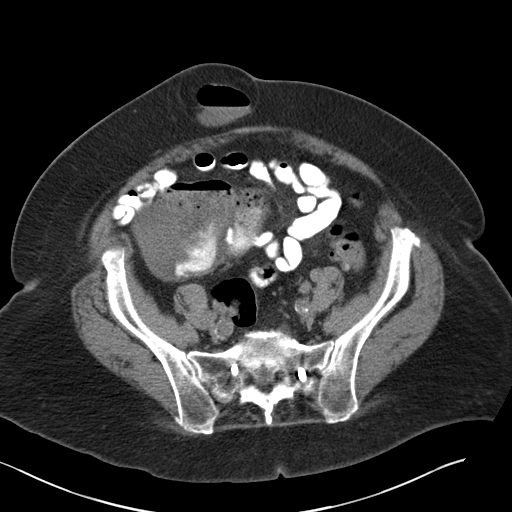
[im 43/86  soft-tissue]
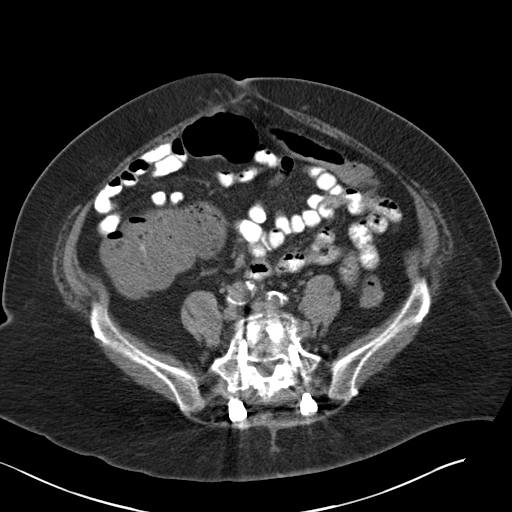
[im 50/86  soft-tissue]
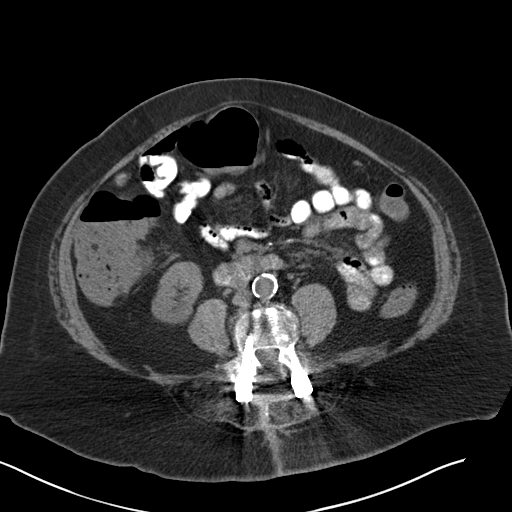
[im 57/86  soft-tissue]
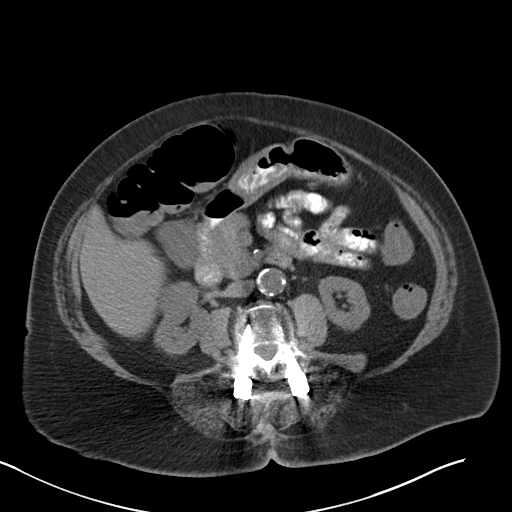
[im 57/86  bone]
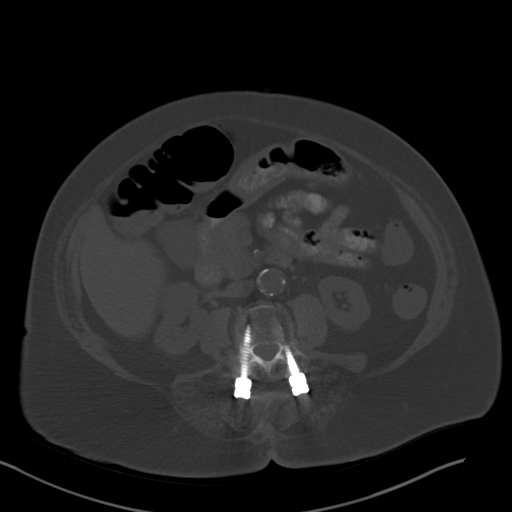
[im 61/86  soft-tissue]
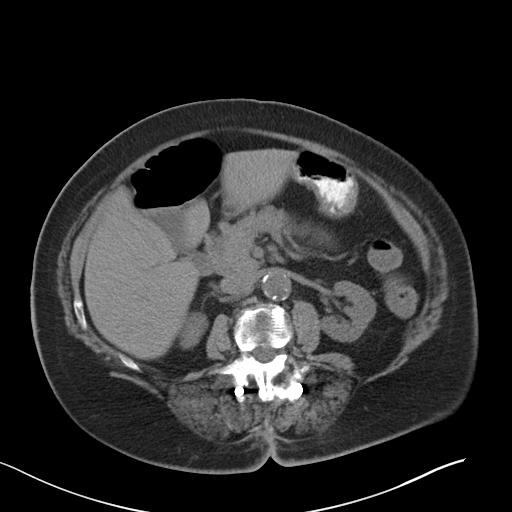
[im 68/86  soft-tissue]
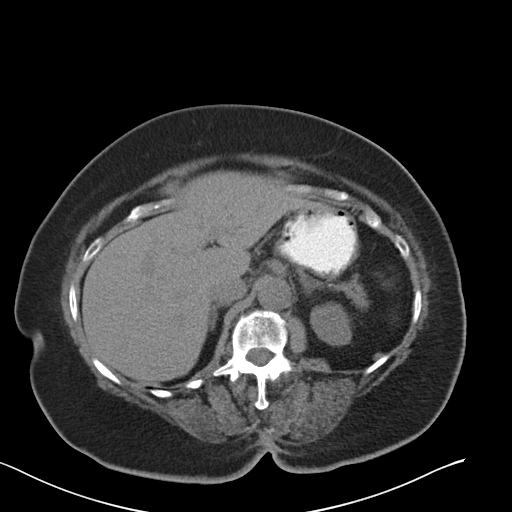
[im 71/86  lung]
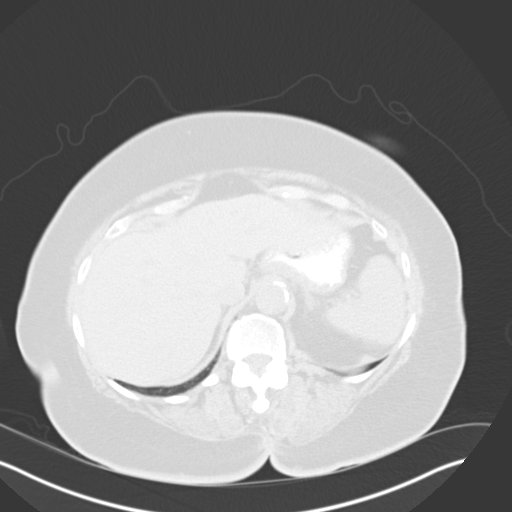
[im 75/86  soft-tissue]
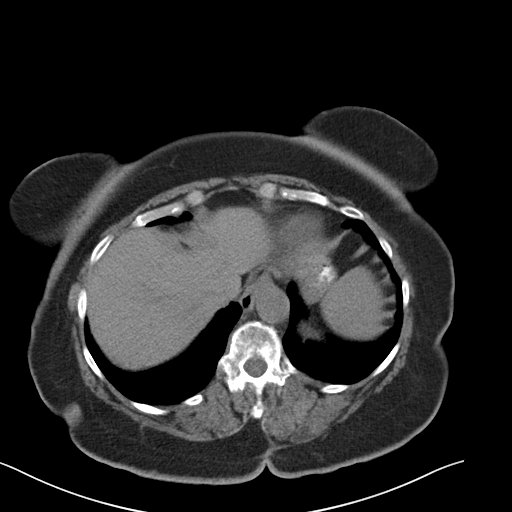
[im 75/86  lung]
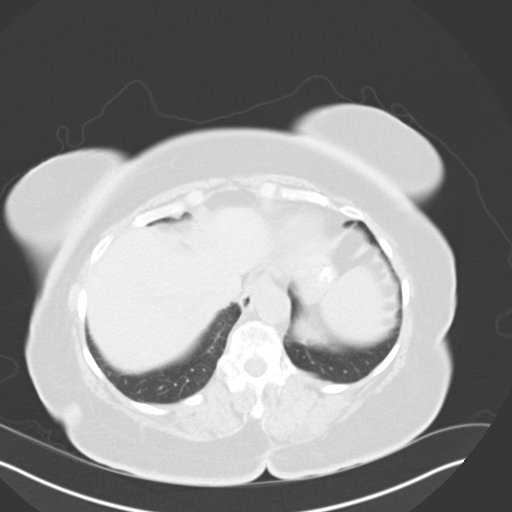
[im 78/86  lung]
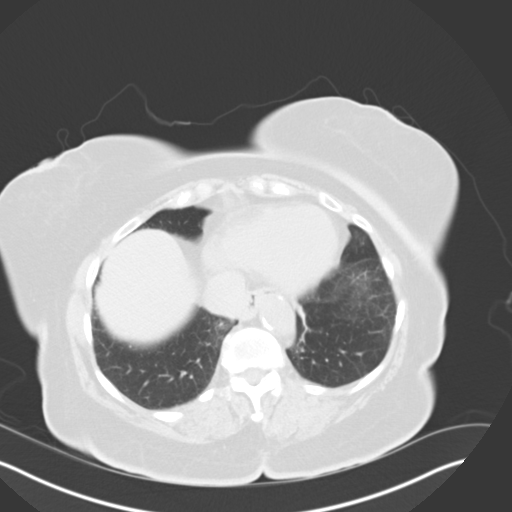
[im 82/86  soft-tissue]
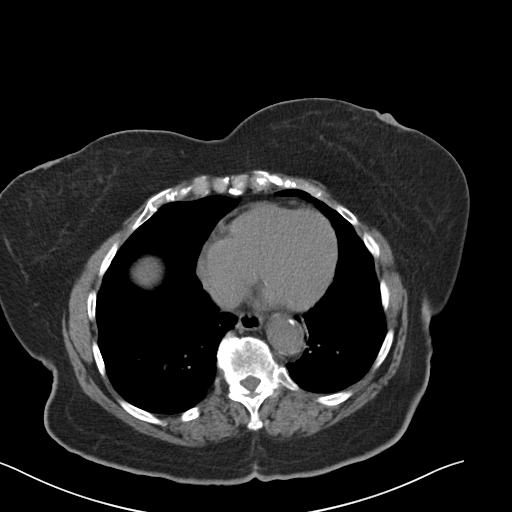
[im 82/86  lung]
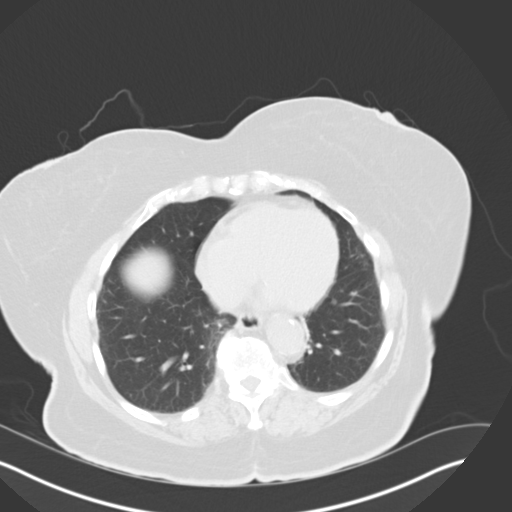

[15 of 32 positions shown; findings below may reference images not displayed]

FINDINGS: The colon is mildly dilated, specifically the right colon, with a
maximum diameter of 7.8 cm. There is no wall thickening or
inflammatory change. A portion of the transverse colon enters a
right paraumbilical hernia, without incarceration or strangulation.
A mild degree of partial obstruction is possible, given that the
colon distal to this is partly decompressed. There are scattered
left colon diverticula. No diverticulitis.

Normal small bowel.

Mild stable interstitial thickening at the lung bases. The heart is
normal in size.

Liver, spleen, gallbladder, pancreas, adrenal glands: Normal.

Kidneys, ureters and bladder are unremarkable. Normal uterus and
adnexa.

Atherosclerotic calcifications are noted along a normal caliber
abdominal aorta. No adenopathy. No ascites.

Status post posterior lumbar spine fusion from L2 through S1, with
orthopedic hardware well-seated. No change from the prior study. No
osteoblastic or osteolytic lesions.
IMPRESSION: 1. Right paraumbilical hernia, which was present previously, now
contains a portion of the transverse colon. The colon proximal to
this is dilated to a maximum of 7.8 cm with the colon distal to this
being mostly decompressed. This suggests of low grade partial
obstruction. No bowel wall thickening or inflammatory changes. No
other evidence of an acute abnormality.

## 2015-03-17 ENCOUNTER — Other Ambulatory Visit: Payer: Self-pay | Admitting: Internal Medicine

## 2015-03-17 DIAGNOSIS — N6310 Unspecified lump in the right breast, unspecified quadrant: Secondary | ICD-10-CM

## 2015-03-17 DIAGNOSIS — R921 Mammographic calcification found on diagnostic imaging of breast: Secondary | ICD-10-CM

## 2015-04-24 ENCOUNTER — Encounter (INDEPENDENT_AMBULATORY_CARE_PROVIDER_SITE_OTHER): Payer: Self-pay

## 2015-04-24 ENCOUNTER — Ambulatory Visit (HOSPITAL_COMMUNITY)
Admission: RE | Admit: 2015-04-24 | Discharge: 2015-04-24 | Disposition: A | Discharge status: Home / Self Care | Payer: Medicare Other | Source: Ambulatory Visit | Attending: Internal Medicine | Admitting: Internal Medicine

## 2015-04-24 ENCOUNTER — Other Ambulatory Visit: Payer: Self-pay | Admitting: Internal Medicine

## 2015-04-24 DIAGNOSIS — R05 Cough: Secondary | ICD-10-CM | POA: Diagnosis present

## 2015-04-24 DIAGNOSIS — M479 Spondylosis, unspecified: Secondary | ICD-10-CM | POA: Insufficient documentation

## 2015-04-24 DIAGNOSIS — I700 Atherosclerosis of aorta: Secondary | ICD-10-CM | POA: Diagnosis not present

## 2015-04-24 DIAGNOSIS — R0989 Other specified symptoms and signs involving the circulatory and respiratory systems: Secondary | ICD-10-CM | POA: Insufficient documentation

## 2015-04-24 DIAGNOSIS — R053 Chronic cough: Secondary | ICD-10-CM

## 2015-05-13 ENCOUNTER — Ambulatory Visit
Admission: RE | Admit: 2015-05-13 | Discharge: 2015-05-13 | Disposition: A | Discharge status: Home / Self Care | Payer: Medicare Other | Source: Ambulatory Visit | Attending: Internal Medicine | Admitting: Internal Medicine

## 2015-05-13 DIAGNOSIS — R921 Mammographic calcification found on diagnostic imaging of breast: Secondary | ICD-10-CM

## 2015-05-13 DIAGNOSIS — N6310 Unspecified lump in the right breast, unspecified quadrant: Secondary | ICD-10-CM

## 2015-07-01 ENCOUNTER — Encounter: Payer: Self-pay | Admitting: Podiatry

## 2015-07-01 ENCOUNTER — Ambulatory Visit (INDEPENDENT_AMBULATORY_CARE_PROVIDER_SITE_OTHER): Payer: Medicare Other | Admitting: Podiatry

## 2015-07-01 ENCOUNTER — Ambulatory Visit (INDEPENDENT_AMBULATORY_CARE_PROVIDER_SITE_OTHER): Payer: Medicare Other

## 2015-07-01 ENCOUNTER — Telehealth: Payer: Self-pay | Admitting: *Deleted

## 2015-07-01 VITALS — BP 144/71 | HR 97 | Resp 16

## 2015-07-01 DIAGNOSIS — M722 Plantar fascial fibromatosis: Secondary | ICD-10-CM | POA: Diagnosis not present

## 2015-07-01 DIAGNOSIS — G629 Polyneuropathy, unspecified: Secondary | ICD-10-CM

## 2015-07-01 MED ORDER — NONFORMULARY OR COMPOUNDED ITEM: Status: DC

## 2015-07-01 NOTE — Progress Notes (Signed)
She presents today with a 2 week duration of pain and itching to the ball of her bilateral foot left greater than right. She states that once he starts to itch he starts to become painful.  Objective: Vital signs are stable she is alert and oriented 3 pulses are palpable. No decrease in sensorium yet. Deep tendon reflexes are intact. Muscle strength is normal. Orthopedics demonstrates hammertoe deformities. Considerable loss of fat pad.  Assessment: Neuropathy bilateral foot.  Plan: Started her on a compounded cream for neuropathy.

## 2015-07-01 NOTE — Telephone Encounter (Addendum)
Dr. Milinda Pointer ordered [place] Peripheral Neuropathy cream.  Faxed.  07/02/2015-Pt called states someone called concerning insurance and she could barely hear them.  I told her I felt it was the [facility], calling concerning her compound cream rx from out office, and gave her [facility] [phone].

## 2015-08-06 ENCOUNTER — Ambulatory Visit (HOSPITAL_COMMUNITY)
Admission: RE | Admit: 2015-08-06 | Discharge: 2015-08-06 | Disposition: A | Discharge status: Home / Self Care | Payer: Medicare Other | Source: Ambulatory Visit | Attending: Internal Medicine | Admitting: Internal Medicine

## 2015-08-06 ENCOUNTER — Other Ambulatory Visit: Payer: Self-pay | Admitting: Internal Medicine

## 2015-08-06 DIAGNOSIS — R200 Anesthesia of skin: Secondary | ICD-10-CM | POA: Diagnosis not present

## 2015-08-06 DIAGNOSIS — M545 Low back pain: Secondary | ICD-10-CM

## 2015-08-06 DIAGNOSIS — M5136 Other intervertebral disc degeneration, lumbar region: Secondary | ICD-10-CM | POA: Insufficient documentation

## 2015-08-06 DIAGNOSIS — M4316 Spondylolisthesis, lumbar region: Secondary | ICD-10-CM | POA: Diagnosis not present

## 2015-12-12 ENCOUNTER — Other Ambulatory Visit: Payer: Self-pay | Admitting: Nephrology

## 2015-12-12 DIAGNOSIS — N179 Acute kidney failure, unspecified: Secondary | ICD-10-CM

## 2015-12-19 ENCOUNTER — Other Ambulatory Visit: Payer: Medicare Other

## 2015-12-19 ENCOUNTER — Ambulatory Visit
Admission: RE | Admit: 2015-12-19 | Discharge: 2015-12-19 | Disposition: A | Discharge status: Home / Self Care | Payer: Medicare Other | Source: Ambulatory Visit | Attending: Nephrology | Admitting: Nephrology

## 2015-12-19 DIAGNOSIS — N179 Acute kidney failure, unspecified: Secondary | ICD-10-CM

## 2016-05-13 DIAGNOSIS — I420 Dilated cardiomyopathy: Secondary | ICD-10-CM

## 2016-05-13 HISTORY — DX: Dilated cardiomyopathy: I42.0

## 2016-05-13 HISTORY — PX: TRANSTHORACIC ECHOCARDIOGRAM: SHX275

## 2016-06-08 ENCOUNTER — Inpatient Hospital Stay (HOSPITAL_COMMUNITY)
Admission: EM | Admit: 2016-06-08 | Discharge: 2016-06-11 | DRG: 280 | Disposition: A | Discharge status: Home / Self Care | Payer: Medicare Other | Attending: Internal Medicine | Admitting: Internal Medicine

## 2016-06-08 ENCOUNTER — Ambulatory Visit (HOSPITAL_COMMUNITY)
Admission: RE | Admit: 2016-06-08 | Discharge: 2016-06-08 | Disposition: A | Discharge status: Home / Self Care | Payer: Medicare Other | Source: Ambulatory Visit | Attending: Internal Medicine | Admitting: Internal Medicine

## 2016-06-08 ENCOUNTER — Other Ambulatory Visit: Payer: Self-pay

## 2016-06-08 ENCOUNTER — Other Ambulatory Visit: Payer: Self-pay | Admitting: Internal Medicine

## 2016-06-08 ENCOUNTER — Encounter (HOSPITAL_COMMUNITY): Payer: Self-pay | Admitting: Emergency Medicine

## 2016-06-08 DIAGNOSIS — I214 Non-ST elevation (NSTEMI) myocardial infarction: Secondary | ICD-10-CM | POA: Diagnosis present

## 2016-06-08 DIAGNOSIS — R0603 Acute respiratory distress: Secondary | ICD-10-CM | POA: Diagnosis present

## 2016-06-08 DIAGNOSIS — R1013 Epigastric pain: Secondary | ICD-10-CM

## 2016-06-08 DIAGNOSIS — I130 Hypertensive heart and chronic kidney disease with heart failure and stage 1 through stage 4 chronic kidney disease, or unspecified chronic kidney disease: Secondary | ICD-10-CM | POA: Diagnosis not present

## 2016-06-08 DIAGNOSIS — I248 Other forms of acute ischemic heart disease: Secondary | ICD-10-CM | POA: Diagnosis present

## 2016-06-08 DIAGNOSIS — Z87891 Personal history of nicotine dependence: Secondary | ICD-10-CM

## 2016-06-08 DIAGNOSIS — Z79899 Other long term (current) drug therapy: Secondary | ICD-10-CM

## 2016-06-08 DIAGNOSIS — Z7982 Long term (current) use of aspirin: Secondary | ICD-10-CM

## 2016-06-08 DIAGNOSIS — N184 Chronic kidney disease, stage 4 (severe): Secondary | ICD-10-CM | POA: Diagnosis not present

## 2016-06-08 DIAGNOSIS — Z833 Family history of diabetes mellitus: Secondary | ICD-10-CM

## 2016-06-08 DIAGNOSIS — D649 Anemia, unspecified: Secondary | ICD-10-CM | POA: Diagnosis present

## 2016-06-08 DIAGNOSIS — I509 Heart failure, unspecified: Secondary | ICD-10-CM | POA: Diagnosis present

## 2016-06-08 DIAGNOSIS — I5021 Acute systolic (congestive) heart failure: Secondary | ICD-10-CM | POA: Diagnosis present

## 2016-06-08 DIAGNOSIS — J90 Pleural effusion, not elsewhere classified: Secondary | ICD-10-CM | POA: Diagnosis present

## 2016-06-08 DIAGNOSIS — R0602 Shortness of breath: Secondary | ICD-10-CM | POA: Diagnosis not present

## 2016-06-08 DIAGNOSIS — I10 Essential (primary) hypertension: Secondary | ICD-10-CM | POA: Diagnosis not present

## 2016-06-08 DIAGNOSIS — R7989 Other specified abnormal findings of blood chemistry: Secondary | ICD-10-CM | POA: Diagnosis present

## 2016-06-08 DIAGNOSIS — R0902 Hypoxemia: Secondary | ICD-10-CM | POA: Diagnosis present

## 2016-06-08 DIAGNOSIS — Z8249 Family history of ischemic heart disease and other diseases of the circulatory system: Secondary | ICD-10-CM

## 2016-06-08 DIAGNOSIS — R748 Abnormal levels of other serum enzymes: Secondary | ICD-10-CM

## 2016-06-08 DIAGNOSIS — R778 Other specified abnormalities of plasma proteins: Secondary | ICD-10-CM | POA: Diagnosis present

## 2016-06-08 LAB — CBC
HEMATOCRIT: 32.5 % — AB (ref 36.0–46.0)
HEMOGLOBIN: 10.7 g/dL — AB (ref 12.0–15.0)
MCH: 28.4 pg (ref 26.0–34.0)
MCHC: 32.9 g/dL (ref 30.0–36.0)
MCV: 86.2 fL (ref 78.0–100.0)
Platelets: 223 10*3/uL (ref 150–400)
RBC: 3.77 MIL/uL — AB (ref 3.87–5.11)
RDW: 16 % — ABNORMAL HIGH (ref 11.5–15.5)
WBC: 9.8 10*3/uL (ref 4.0–10.5)

## 2016-06-08 LAB — I-STAT TROPONIN, ED: Troponin i, poc: 0.12 ng/mL (ref 0.00–0.08)

## 2016-06-08 LAB — BASIC METABOLIC PANEL
ANION GAP: 10 (ref 5–15)
BUN: 37 mg/dL — ABNORMAL HIGH (ref 6–20)
CHLORIDE: 109 mmol/L (ref 101–111)
CO2: 23 mmol/L (ref 22–32)
Calcium: 9.1 mg/dL (ref 8.9–10.3)
Creatinine, Ser: 1.84 mg/dL — ABNORMAL HIGH (ref 0.44–1.00)
GFR calc Af Amer: 27 mL/min — ABNORMAL LOW (ref >60)
GFR calc non Af Amer: 23 mL/min — ABNORMAL LOW (ref >60)
Glucose, Bld: 109 mg/dL — ABNORMAL HIGH (ref 65–99)
Potassium: 4.3 mmol/L (ref 3.5–5.1)
Sodium: 142 mmol/L (ref 135–145)

## 2016-06-08 LAB — RETICULOCYTES
RBC.: 3.72 MIL/uL — ABNORMAL LOW (ref 3.87–5.11)
Retic Count, Absolute: 59.5 10*3/uL (ref 19.0–186.0)
Retic Ct Pct: 1.6 % (ref 0.4–3.1)

## 2016-06-08 LAB — IRON AND TIBC
IRON: 20 ug/dL — AB (ref 28–170)
SATURATION RATIOS: 7 % — AB (ref 10.4–31.8)
TIBC: 283 ug/dL (ref 250–450)
UIBC: 263 ug/dL

## 2016-06-08 LAB — BRAIN NATRIURETIC PEPTIDE: B NATRIURETIC PEPTIDE 5: 997.3 pg/mL — AB (ref 0.0–100.0)

## 2016-06-08 LAB — VITAMIN B12: Vitamin B-12: 340 pg/mL (ref 180–914)

## 2016-06-08 LAB — FOLATE: Folate: 12.9 ng/mL (ref >5.9)

## 2016-06-08 LAB — TSH: TSH: 1.934 u[IU]/mL (ref 0.350–4.500)

## 2016-06-08 LAB — FERRITIN: FERRITIN: 52 ng/mL (ref 11–307)

## 2016-06-08 LAB — TROPONIN I
TROPONIN I: 0.12 ng/mL — AB (ref <0.03)
Troponin I: 0.14 ng/mL (ref <0.03)

## 2016-06-08 MED ORDER — ACETAMINOPHEN 325 MG PO TABS
650.0000 mg | ORAL_TABLET | ORAL | Status: DC | PRN
  Filled 2016-06-08: qty 2

## 2016-06-08 MED ORDER — SODIUM CHLORIDE 0.9% FLUSH: 3.0000 mL | INTRAVENOUS | Status: DC | PRN

## 2016-06-08 MED ORDER — ONDANSETRON HCL 4 MG/2ML IJ SOLN: 4.0000 mg | Freq: Four times a day (QID) | INTRAMUSCULAR | Status: DC | PRN

## 2016-06-08 MED ORDER — ENOXAPARIN SODIUM 30 MG/0.3ML ~~LOC~~ SOLN
30.0000 mg | SUBCUTANEOUS | Status: DC
  Administered 2016-06-08 – 2016-06-09 (×2): 30 mg via SUBCUTANEOUS
  Filled 2016-06-08 (×3): qty 0.3

## 2016-06-08 MED ORDER — FUROSEMIDE 10 MG/ML IJ SOLN
60.0000 mg | Freq: Once | INTRAMUSCULAR | Status: AC
  Administered 2016-06-08: 60 mg via INTRAVENOUS
  Filled 2016-06-08: qty 6

## 2016-06-08 MED ORDER — FELODIPINE ER 10 MG PO TB24
10.0000 mg | ORAL_TABLET | Freq: Every day | ORAL | Status: DC
  Administered 2016-06-09 – 2016-06-10 (×2): 10 mg via ORAL
  Filled 2016-06-08 (×2): qty 1

## 2016-06-08 MED ORDER — FUROSEMIDE 10 MG/ML IJ SOLN
60.0000 mg | Freq: Two times a day (BID) | INTRAMUSCULAR | Status: DC
  Administered 2016-06-08 – 2016-06-09 (×2): 60 mg via INTRAVENOUS
  Filled 2016-06-08 (×2): qty 6

## 2016-06-08 MED ORDER — SODIUM CHLORIDE 0.9% FLUSH
3.0000 mL | Freq: Two times a day (BID) | INTRAVENOUS | Status: DC
  Administered 2016-06-08 – 2016-06-11 (×6): 3 mL via INTRAVENOUS

## 2016-06-08 MED ORDER — CARVEDILOL 6.25 MG PO TABS
6.2500 mg | ORAL_TABLET | Freq: Two times a day (BID) | ORAL | Status: DC
  Administered 2016-06-08 – 2016-06-11 (×6): 6.25 mg via ORAL
  Filled 2016-06-08 (×6): qty 1

## 2016-06-08 MED ORDER — ASPIRIN EC 81 MG PO TBEC
81.0000 mg | DELAYED_RELEASE_TABLET | Freq: Every day | ORAL | Status: DC
  Administered 2016-06-09 – 2016-06-11 (×3): 81 mg via ORAL
  Filled 2016-06-08 (×3): qty 1

## 2016-06-08 MED ORDER — SODIUM CHLORIDE 0.9 % IV SOLN: 250.0000 mL | INTRAVENOUS | Status: DC | PRN

## 2016-06-08 NOTE — ED Notes (Signed)
Attempted report x1. 

## 2016-06-08 NOTE — ED Notes (Signed)
ED Provider at bedside. 

## 2016-06-08 NOTE — ED Triage Notes (Signed)
Pt from PCP with concern for fluid over load.  Pt reports SOB x 1 week with exertion and bilateral leg swelling x 3 weeks.  Pt started on "fluid pills" with minor relief of leg swelling.  NAD, A&O.

## 2016-06-08 NOTE — H&P (Signed)
History and Physical    [name] LKJ:[id number] DOB: 26-Jun-1923 DOA: 06/08/2016   PCP: [name], MD   Patient coming from/Resides with: Private residence/alone  Admission status: Observation/telemetry -it may be medically necessary to stay a minimum 2 midnights to rule out impending and/or unexpected changes in physiologic status that may differ from initial evaluation performed in the ER and/or at time of admission therefore consider reevaluation of admission status in 24 hours.   Chief Complaint: Shortness of breath and dyspnea on exertion  HPI: [name] is a 83 y.o. female with medical history significant for hypertension, stage IV chronic kidney disease, history of prior ventral hernia repair and partial bowel obstruction. Patient began developing edema 3 weeks ago. PCP began Lasix 20 mg daily without any change in the patient's edema. She has been short of breath with dyspnea on exertion 1 week. Her weight has varied between 163 and 166 pounds dry weight unknown. No prior history of CHF.  ED Course:  Vital Signs: BP 154/91   Pulse 88   Temp 97.8 F (36.6 C) (Oral)   Resp 15   Ht 5\' 2"  (1.575 m)   Wt 73.9 kg (163 lb)   SpO2 99%   BMI 29.81 kg/m  2 view CXR: Small bilateral pleural effusions with mild hyperinflation of the lungs Lab data: Sodium 142, potassium 4.3, chloride 109, CO2 23, glucose 109, BUN 37, creatinine 1.84, BNP 997, poc troponin 0.12, white count 9800, hemoglobin 10.7, platelets 223,000 Medications and treatments: Lasix 60 mg IV 1  Review of Systems:  In addition to the HPI above,  No Fever-chills, myalgias or other constitutional symptoms No Headache, changes with Vision or hearing, new weakness, tingling, numbness in any extremity, dizziness, dysarthria or word finding difficulty, gait disturbance or imbalance, tremors or seizure activity No problems swallowing food or Liquids, indigestion/reflux, choking or coughing while eating, abdominal  pain with or after eating No Chest pain, palpitations No Abdominal pain, N/V, melena,hematochezia, dark tarry stools, constipation No dysuria, malodorous urine, hematuria or flank pain No new skin rashes, lesions, masses or bruises, No new joint pains, aches, swelling or redness No recent unintentional weight gain or loss No polyuria, polydypsia or polyphagia   Past Medical History:  Diagnosis Date  . Breast mass   . Cervical spondylosis   . Hypertension     Past Surgical History:  Procedure Laterality Date  . APPENDECTOMY    . BACK SURGERY     x 4  . CATARACT EXTRACTION    . KNEE SURGERY     x 2  . VENTRAL HERNIA REPAIR N/A 09/14/2013   Procedure: LAPAROSCOPIC ventral HERNIA REPAIR with mesh ;  Surgeon: Shann Medal, MD;  Location: [facility];  Service: General;  Laterality: N/A;    Social History   Social History  . Marital status: Widowed    Spouse name: N/A  . Number of children: N/A  . Years of education: N/A   Occupational History  . Not on file.   Social History Main Topics  . Smoking status: Former Smoker    Types: Cigarettes  . Smokeless tobacco: Never Used  . Alcohol use No  . Drug use: No  . Sexual activity: Not on file   Other Topics Concern  . Not on file   Social History Narrative  . No narrative on file    Mobility: [place] Work history: Not obtained   No Known Allergies   Family history reviewed and not pertinent  to current admission findings or treatment  Prior to Admission medications   Medication Sig Start Date End Date Taking? Authorizing Provider  allopurinol (ZYLOPRIM) 100 MG tablet  09/26/13   Historical Provider, MD  aspirin EC 81 MG tablet Take 81 mg by mouth every morning.    Historical Provider, MD  felodipine (PLENDIL) 10 MG 24 hr tablet Take 10 mg by mouth every morning.    Historical Provider, MD  Multiple Vitamins-Minerals (PRESERVISION AREDS PO) Take by mouth.    Historical Provider, MD  nabumetone (RELAFEN) 500 MG tablet  Take 500 mg by mouth every morning.    Historical Provider, MD  Naproxen Sodium (ALEVE PO) Take by mouth.    Historical Provider, MD  NONFORMULARY OR COMPOUNDED [place] compound:  Peripheral Neuropathy cream - Bupivacaine 1%, Doxepin 3%, Gabapentin 6%, Pentoxifylline 3%, Topiramate 1%, dispense 120gram apply 102 grams to affected area 3-4 times daily, +3refills. 07/01/15   Max T Milinda Pointer, DPM  valsartan-hydrochlorothiazide (DIOVAN-HCT) 160-12.5 MG per tablet  09/05/13   Historical Provider, MD    Physical Exam: Vitals:   06/08/16 1233 06/08/16 1315 06/08/16 1330 06/08/16 1400  BP:  139/90 155/89 154/91  Pulse: 89 80 90 88  Resp: 24 22 20 15   Temp:      TempSrc:      SpO2: 95% 94% 94% 99%  Weight:      Height:          Constitutional: NAD, calm, comfortable Eyes: PERRL, lids and conjunctivae normal ENMT: Mucous membranes are moist. Posterior pharynx clear of any exudate or lesions.Normal dentition.  Neck: normal, supple, no masses, no thyromegaly Respiratory: Decreased in bases with bibasilar crackles mid fields down. Normal respiratory effort. No accessory muscle use. RA Cardiovascular: Regular rate and rhythm, no murmurs / rubs / gallops. 2-3+ bilateral lower extremity edema. 2+ pedal pulses. No carotid bruits.  Abdomen: no tenderness, no masses palpated. No hepatosplenomegaly. Bowel sounds positive.  Musculoskeletal: no clubbing / cyanosis. No joint deformity upper and lower extremities. Good ROM, no contractures. Normal muscle tone.  Skin: no rashes, lesions, ulcers. No induration Neurologic: CN 2-12 grossly intact. Sensation intact, DTR normal. Strength 5/5 x all 4 extremities.  Psychiatric: Normal judgment and insight. Alert and oriented x 3. Normal mood.    Labs on Admission: I have personally reviewed following labs and imaging studies  CBC:  Recent Labs Lab 06/08/16 1122  WBC 9.8  HGB 10.7*  HCT 32.5*  MCV 86.2  PLT 053   Basic Metabolic  Panel:  Recent Labs Lab 06/08/16 1122  NA 142  K 4.3  CL 109  CO2 23  GLUCOSE 109*  BUN 37*  CREATININE 1.84*  CALCIUM 9.1   GFR: Estimated Creatinine Clearance: 19.1 mL/min (by C-G formula based on SCr of 1.84 mg/dL (H)). Liver Function Tests: No results for input(s): AST, ALT, ALKPHOS, BILITOT, PROT, ALBUMIN in the last 168 hours. No results for input(s): LIPASE, AMYLASE in the last 168 hours. No results for input(s): AMMONIA in the last 168 hours. Coagulation Profile: No results for input(s): INR, PROTIME in the last 168 hours. Cardiac Enzymes: No results for input(s): CKTOTAL, CKMB, CKMBINDEX, TROPONINI in the last 168 hours. BNP (last 3 results) No results for input(s): PROBNP in the last 8760 hours. HbA1C: No results for input(s): HGBA1C in the last 72 hours. CBG: No results for input(s): GLUCAP in the last 168 hours. Lipid Profile: No results for input(s): CHOL, HDL, LDLCALC, TRIG, CHOLHDL, LDLDIRECT in the last 72  hours. Thyroid Function Tests: No results for input(s): TSH, T4TOTAL, FREET4, T3FREE, THYROIDAB in the last 72 hours. Anemia Panel: No results for input(s): VITAMINB12, FOLATE, FERRITIN, TIBC, IRON, RETICCTPCT in the last 72 hours. Urine analysis:    Component Value Date/Time   COLORURINE YELLOW 09/13/2013 [place] 09/13/2013 1633   LABSPEC 1.022 09/13/2013 1633   PHURINE 5.5 09/13/2013 1633   GLUCOSEU NEGATIVE 09/13/2013 1633   HGBUR NEGATIVE 09/13/2013 1633   BILIRUBINUR NEGATIVE 09/13/2013 1633   KETONESUR NEGATIVE 09/13/2013 1633   PROTEINUR NEGATIVE 09/13/2013 1633   UROBILINOGEN 0.2 09/13/2013 1633   NITRITE NEGATIVE 09/13/2013 1633   LEUKOCYTESUR SMALL (A) 09/13/2013 1633   Sepsis Labs: @LABRCNTIP (procalcitonin:4,lacticidven:4) )No results found for this or any previous visit (from the past 240 hour(s)).   Radiological Exams on Admission: Dg Chest 2 View  Result Date: 06/08/2016 CLINICAL DATA:  Shortness of breath,  cough, congestion. EXAM: CHEST  2 VIEW COMPARISON:  04/24/2015 FINDINGS: There is hyperinflation of the lungs compatible with COPD. Small bilateral pleural effusions. Heart is borderline enlarged. No confluent airspace opacities. Degenerative changes in the thoracic spine. IMPRESSION: COPD.  Small bilateral effusions. Electronically Signed   By: Rolm Baptise M.D.   On: 06/08/2016 10:19    EKG: (Independently reviewed) Normal sinus rhythm with ventricular rate 76 bpm, QTC 427 ms, no acute ischemic changes  Assessment/Plan Principal Problem:   New onset of congestive heart failure  -Patient presents with 3 weeks of lower extremity edema associated with shortness of breath, dyspnea on exertion and nonproductive cough with clinical findings of bilateral pleural effusions and elevated BNP -Prescribed Lasix 20 mg PO over 3 weeks without significant diuresis -Lasix 60 mg IV q 12 hrs -Began carvedilol 6.25 mg BID -No ACE inhibitor or ARB secondary to CPD -Echocardiogram -Daily weights and strict I/O -BSC -PT evaluation since lives alone  Active Problems:   Bilateral pleural effusion -Secondary to new-onset heart failure -Repeat chest x-ray in a.m.    Elevated troponin -Denies chest pain or arm pressure/pain -Likely secondary to heart failure -Cycle troponin -FU on echo    HTN (hypertension) -Medication reconciliation pending -Carvedilol as above -For now, no ACE inhibitor/ARB as above    CKD (chronic kidney disease) stage 4, GFR 15-29 ml/min  -Renal function stable and at baseline (37/1.84) -Monitor closely with diuresis    Anemia -Check TSH and anemia panel      DVT prophylaxis: Lovenox dose adjusted for low GFR Code Status: Full Family Communication: Daughter at bedside Disposition Plan: Back to home environment Consults called: None    Bode Pieper L. ANP-BC [facility] Pager [phone]   If 7PM-7AM, please contact night-coverage [email or web address] Password  [facility]  06/08/2016, 2:34 PM

## 2016-06-08 NOTE — ED Notes (Signed)
Lissa Merlin NP at bedside speaking with pt. And family

## 2016-06-08 NOTE — ED Provider Notes (Signed)
[facility] DEPT Provider Note   CSN: [id number] Arrival date & time: 06/08/16  1059     History   Chief Complaint Chief Complaint  Patient presents with  . Shortness of Breath  . Leg Swelling    HPI [name] is a 83 y.o. female.  HPI Patient presents to the emergency department with complaints of increasing exertional shortness of breath over the past week with increasing swelling over the past couple weeks.  She was placed on Lasix by her doctor without improvement in her lower extremity swelling.  She reports she can only walk short distances before she has to stop.  No significant orthopnea described.  No prior history of congestive heart failure.  No recent chest pain or chest tightness.  No active chest pain.   Past Medical History:  Diagnosis Date  . Breast mass   . Cervical spondylosis   . Hypertension     Patient Active Problem List   Diagnosis Date Noted  . New onset of congestive heart failure ([place]) 06/08/2016  . Anemia 06/08/2016  . Bilateral pleural effusion 06/08/2016  . Elevated troponin 06/08/2016  . Ventral hernia 09/15/2013  . CKD (chronic kidney disease), stage IV ([place]) 09/15/2013  . Acute on chronic renal failure ([place]) 09/14/2013  . HTN (hypertension) 09/14/2013  . Partial bowel obstruction 09/13/2013    Past Surgical History:  Procedure Laterality Date  . APPENDECTOMY    . BACK SURGERY     x 4  . CATARACT EXTRACTION    . KNEE SURGERY     x 2  . VENTRAL HERNIA REPAIR N/A 09/14/2013   Procedure: LAPAROSCOPIC ventral HERNIA REPAIR with mesh ;  Surgeon: Shann Medal, MD;  Location: [facility];  Service: General;  Laterality: N/A;    OB History    No data available       Home Medications    Prior to Admission medications   Medication Sig Start Date End Date Taking? Authorizing Provider  allopurinol (ZYLOPRIM) 100 MG tablet  09/26/13   Historical Provider, MD  aspirin EC 81 MG tablet Take 81 mg by mouth every morning.    Historical  Provider, MD  felodipine (PLENDIL) 10 MG 24 hr tablet Take 10 mg by mouth every morning.    Historical Provider, MD  Multiple Vitamins-Minerals (PRESERVISION AREDS PO) Take by mouth.    Historical Provider, MD  nabumetone (RELAFEN) 500 MG tablet Take 500 mg by mouth every morning.    Historical Provider, MD  Naproxen Sodium (ALEVE PO) Take by mouth.    Historical Provider, MD  NONFORMULARY OR COMPOUNDED [place] compound:  Peripheral Neuropathy cream - Bupivacaine 1%, Doxepin 3%, Gabapentin 6%, Pentoxifylline 3%, Topiramate 1%, dispense 120gram apply 102 grams to affected area 3-4 times daily, +3refills. 07/01/15   Max T Milinda Pointer, DPM  valsartan-hydrochlorothiazide (DIOVAN-HCT) 160-12.5 MG per tablet  09/05/13   Historical Provider, MD    Family History History reviewed. No pertinent family history.  Social History Social History  Substance Use Topics  . Smoking status: Former Smoker    Types: Cigarettes  . Smokeless tobacco: Never Used  . Alcohol use No     Allergies   Patient has no known allergies.   Review of Systems Review of Systems  All other systems reviewed and are negative.    Physical Exam Updated Vital Signs BP 154/91   Pulse 88   Temp 97.8 F (36.6 C) (Oral)   Resp 15   Ht 5\' 2"  (1.575  m)   Wt 163 lb (73.9 kg)   SpO2 99%   BMI 29.81 kg/m   Physical Exam  Constitutional: She is oriented to person, place, and time. She appears well-developed and well-nourished. No distress.  HENT:  Head: Normocephalic and atraumatic.  Eyes: EOM are normal.  Neck: Normal range of motion.  Cardiovascular: Normal rate, regular rhythm and normal heart sounds.   Pulmonary/Chest: Effort normal and breath sounds normal.  Abdominal: Soft. She exhibits no distension. There is no tenderness.  Musculoskeletal: Normal range of motion. She exhibits edema.  Neurological: She is alert and oriented to person, place, and time.  Skin: Skin is warm and dry.  Psychiatric: She  has a normal mood and affect. Judgment normal.  Nursing note and vitals reviewed.    ED Treatments / Results  Labs (all labs ordered are listed, but only abnormal results are displayed) Labs Reviewed  BASIC METABOLIC PANEL - Abnormal; Notable for the following:       Result Value   Glucose, Bld 109 (*)    BUN 37 (*)    Creatinine, Ser 1.84 (*)    GFR calc non Af Amer 23 (*)    GFR calc Af Amer 27 (*)    All other components within normal limits  CBC - Abnormal; Notable for the following:    RBC 3.77 (*)    Hemoglobin 10.7 (*)    HCT 32.5 (*)    RDW 16.0 (*)    All other components within normal limits  BRAIN NATRIURETIC PEPTIDE - Abnormal; Notable for the following:    B Natriuretic Peptide 997.3 (*)    All other components within normal limits  I-STAT TROPOININ, ED - Abnormal; Notable for the following:    Troponin i, poc 0.12 (*)    All other components within normal limits  TSH  VITAMIN B12  FOLATE  IRON AND TIBC  FERRITIN  RETICULOCYTES  TROPONIN I  TROPONIN I  TROPONIN I    EKG  EKG Interpretation  Date/Time:  Tuesday June 08 2016 11:12:39 EST Ventricular Rate:  94 PR Interval:  144 QRS Duration: 88 QT Interval:  390 QTC Calculation: 487 R Axis:   69 Text Interpretation:  Normal sinus rhythm Possible Left atrial enlargement Septal infarct , age undetermined Abnormal ECG No significant change was found Confirmed by Ameliyah Sarno  MD, Lennette Bihari ([id number]) on 06/08/2016 1:22:33 PM       Radiology Dg Chest 2 View  Result Date: 06/08/2016 CLINICAL DATA:  Shortness of breath, cough, congestion. EXAM: CHEST  2 VIEW COMPARISON:  04/24/2015 FINDINGS: There is hyperinflation of the lungs compatible with COPD. Small bilateral pleural effusions. Heart is borderline enlarged. No confluent airspace opacities. Degenerative changes in the thoracic spine. IMPRESSION: COPD.  Small bilateral effusions. Electronically Signed   By: Rolm Baptise M.D.   On: 06/08/2016 10:19     Procedures Procedures (including critical care time)  Medications Ordered in ED Medications  aspirin EC tablet 81 mg (not administered)  felodipine (PLENDIL) 24 hr tablet 10 mg (not administered)  furosemide (LASIX) injection 60 mg (60 mg Intravenous Given 06/08/16 1415)     Initial Impression / Assessment and Plan / ED Course  I have reviewed the triage vital signs and the nursing notes.  Pertinent labs & imaging results that were available during my care of the patient were reviewed by me and considered in my medical decision making (see chart for details).     Patient be admitted for new onset  exertional shortness of breath and likely congestive heart failure.  She will need IV Lasix now.  Echocardiogram in the hospital.  Troponin will need to be cycled.  EKG without ischemic changes.  Final Clinical Impressions(s) / ED Diagnoses   Final diagnoses:  Acute congestive heart failure, unspecified congestive heart failure type [facility])    New Prescriptions New Prescriptions   No medications on file     Jola Schmidt, MD 06/08/16 1422

## 2016-06-09 ENCOUNTER — Observation Stay (HOSPITAL_COMMUNITY): Payer: Medicare Other

## 2016-06-09 ENCOUNTER — Observation Stay (HOSPITAL_BASED_OUTPATIENT_CLINIC_OR_DEPARTMENT_OTHER): Payer: Medicare Other

## 2016-06-09 DIAGNOSIS — I509 Heart failure, unspecified: Secondary | ICD-10-CM

## 2016-06-09 DIAGNOSIS — I5021 Acute systolic (congestive) heart failure: Secondary | ICD-10-CM | POA: Diagnosis present

## 2016-06-09 DIAGNOSIS — J90 Pleural effusion, not elsewhere classified: Secondary | ICD-10-CM | POA: Diagnosis present

## 2016-06-09 DIAGNOSIS — Z833 Family history of diabetes mellitus: Secondary | ICD-10-CM | POA: Diagnosis not present

## 2016-06-09 DIAGNOSIS — I214 Non-ST elevation (NSTEMI) myocardial infarction: Secondary | ICD-10-CM | POA: Diagnosis present

## 2016-06-09 DIAGNOSIS — Z7982 Long term (current) use of aspirin: Secondary | ICD-10-CM | POA: Diagnosis not present

## 2016-06-09 DIAGNOSIS — D649 Anemia, unspecified: Secondary | ICD-10-CM | POA: Diagnosis present

## 2016-06-09 DIAGNOSIS — Z8249 Family history of ischemic heart disease and other diseases of the circulatory system: Secondary | ICD-10-CM | POA: Diagnosis not present

## 2016-06-09 DIAGNOSIS — R0602 Shortness of breath: Secondary | ICD-10-CM | POA: Diagnosis present

## 2016-06-09 DIAGNOSIS — R748 Abnormal levels of other serum enzymes: Secondary | ICD-10-CM | POA: Diagnosis not present

## 2016-06-09 DIAGNOSIS — I130 Hypertensive heart and chronic kidney disease with heart failure and stage 1 through stage 4 chronic kidney disease, or unspecified chronic kidney disease: Secondary | ICD-10-CM | POA: Diagnosis present

## 2016-06-09 DIAGNOSIS — I248 Other forms of acute ischemic heart disease: Secondary | ICD-10-CM | POA: Diagnosis present

## 2016-06-09 DIAGNOSIS — Z79899 Other long term (current) drug therapy: Secondary | ICD-10-CM | POA: Diagnosis not present

## 2016-06-09 DIAGNOSIS — R0603 Acute respiratory distress: Secondary | ICD-10-CM | POA: Diagnosis present

## 2016-06-09 DIAGNOSIS — R0902 Hypoxemia: Secondary | ICD-10-CM | POA: Diagnosis present

## 2016-06-09 DIAGNOSIS — D509 Iron deficiency anemia, unspecified: Secondary | ICD-10-CM | POA: Diagnosis not present

## 2016-06-09 DIAGNOSIS — Z87891 Personal history of nicotine dependence: Secondary | ICD-10-CM | POA: Diagnosis not present

## 2016-06-09 DIAGNOSIS — N184 Chronic kidney disease, stage 4 (severe): Secondary | ICD-10-CM | POA: Diagnosis present

## 2016-06-09 LAB — BASIC METABOLIC PANEL
ANION GAP: 10 (ref 5–15)
BUN: 36 mg/dL — ABNORMAL HIGH (ref 6–20)
CO2: 25 mmol/L (ref 22–32)
Calcium: 8.9 mg/dL (ref 8.9–10.3)
Chloride: 106 mmol/L (ref 101–111)
Creatinine, Ser: 1.85 mg/dL — ABNORMAL HIGH (ref 0.44–1.00)
GFR, EST AFRICAN AMERICAN: 27 mL/min — AB (ref >60)
GFR, EST NON AFRICAN AMERICAN: 23 mL/min — AB (ref >60)
Glucose, Bld: 94 mg/dL (ref 65–99)
POTASSIUM: 3.5 mmol/L (ref 3.5–5.1)
SODIUM: 141 mmol/L (ref 135–145)

## 2016-06-09 LAB — ECHOCARDIOGRAM COMPLETE
HEIGHTINCHES: 62 in
WEIGHTICAEL: 2555.2 [oz_av]

## 2016-06-09 LAB — TROPONIN I
TROPONIN I: 0.16 ng/mL — AB (ref <0.03)
TROPONIN I: 0.6 ng/mL — AB (ref <0.03)
TROPONIN I: 0.93 ng/mL — AB (ref <0.03)
Troponin I: 0.82 ng/mL (ref <0.03)

## 2016-06-09 MED ORDER — FUROSEMIDE 10 MG/ML IJ SOLN: 60.0000 mg | Freq: Every day | INTRAMUSCULAR | Status: DC

## 2016-06-09 MED ORDER — POTASSIUM CHLORIDE CRYS ER 20 MEQ PO TBCR
30.0000 meq | EXTENDED_RELEASE_TABLET | Freq: Once | ORAL | Status: AC
  Administered 2016-06-09: 30 meq via ORAL
  Filled 2016-06-09: qty 1

## 2016-06-09 MED ORDER — PERFLUTREN LIPID MICROSPHERE
1.0000 mL | INTRAVENOUS | Status: AC | PRN
  Administered 2016-06-09: 2 mL via INTRAVENOUS
  Filled 2016-06-09: qty 10

## 2016-06-09 NOTE — Progress Notes (Signed)
PROGRESS NOTE    [name]  OFB:[id number] DOB: Sep 16, 1923 DOA: 06/08/2016 PCP: [name], MD   Brief Narrative:  Patient admitted for severe vol overload. With overnight diuresis she did well and echo was ordered.    Assessment & Plan:   Principal Problem:   New onset of congestive heart failure (HCC) Active Problems:   HTN (hypertension)   CKD (chronic kidney disease) stage 4, GFR 15-29 ml/min (HCC)   Anemia   Bilateral pleural effusion   Elevated troponin   Acute congestive heart failure (HCC)  Acute Resp Distress with hypoxia, likely from vol overload  -lasix iv, strict I/Os, daily weight  -echo done -results pending -trend CE -might need cardiology consult if her echo comes back abnormal for med optimization -BB added, no ACEI/ARB at this time    Elevated trops/NSTEMI -likely demand ischemia, trend for now  -repeat ekg in am   B/L pleural effusions -likely related to CHF    HTN (hypertension) -Medication reconciliation pending -Carvedilol as above -For now, no ACE inhibitor/ARB as above    CKD (chronic kidney disease) stage 4, GFR 15-29 ml/min  -Renal function stable and at baseline (37/1.84) -Monitor closely with diuresis    Anemia -Check TSH and anemia panel   Addendum at 6pm: Echo results shows depressed ef of 20%. She will need cardiology consult in the morning for med optimization prior to discharge.   DVT prophylaxis: Lovenox dose adjusted for low GFR Code Status: Full Family Communication: Daughter at bedside Disposition Plan: Back to home environment Consults called: None  It is my clinical opinion that admission to INPATIENT is reasonable and necessary in this 83 y.o. female . presenting with symptoms of acute sob, concerning for systolic new onset chf . in the context of PMH including: As mentioned above . with pertinent positives on physical exam including: B/L LE swelling and bibasilar diminished BS . and pertinent positives  on radiographic and laboratory data including:Echo shoes depressed ef 20% . Workup and treatment include medication optimization and cardiology consult.   Given the aforementioned, the predictability of an adverse outcome is felt to be significant. I expect that the patient will require at least 2 midnights in the hospital to treat this condition.    Subjective: Patient states she is feeling much better than yesterday but still gets little sob with activity. All of this had started about 2 weeks ago, she denies any chest pain and other complaints at this time.   Objective: Vitals:   06/09/16 0418 06/09/16 0902 06/09/16 1300 06/09/16 1623  BP: 139/75 110/76 (!) 93/51 118/78  Pulse: 85 94 69 78  Resp: 18 20 20    Temp: 98.2 F (36.8 C) 98.2 F (36.8 C) 97.9 F (36.6 C)   TempSrc: Oral Oral Oral   SpO2: 94% 94% 92%   Weight: 72.4 kg (159 lb 11.2 oz)     Height:        Intake/Output Summary (Last 24 hours) at 06/09/16 1851 Last data filed at 06/09/16 1700  Gross per 24 hour  Intake              240 ml  Output             2431 ml  Net            -2191 ml   Filed Weights   06/08/16 1114 06/08/16 1701 06/09/16 0418  Weight: 73.9 kg (163 lb) 74.2 kg (163 lb 8 oz) 72.4 kg (159 lb 11.2  oz)    Examination:  General exam: Appears calm and comfortable  Respiratory system: diminished BS at b/l bases Cardiovascular system: S1 & S2 heard, RRR. No JVD, murmurs, rubs, gallops or clicks.2+ b/l LE pitting edema. Gastrointestinal system: Abdomen is nondistended, soft and nontender. No organomegaly or masses felt. Normal bowel sounds heard. Central nervous system: Alert and oriented. No focal neurological deficits. Extremities: Symmetric 5 x 5 power. Skin: No rashes, lesions or ulcers Psychiatry: Judgement and insight appear normal. Mood & affect appropriate.     Data Reviewed:   CBC:  Recent Labs Lab 06/08/16 1122  WBC 9.8  HGB 10.7*  HCT 32.5*  MCV 86.2  PLT 962   Basic  Metabolic Panel:  Recent Labs Lab 06/08/16 1122 06/09/16 0152  NA 142 141  K 4.3 3.5  CL 109 106  CO2 23 25  GLUCOSE 109* 94  BUN 37* 36*  CREATININE 1.84* 1.85*  CALCIUM 9.1 8.9   GFR: Estimated Creatinine Clearance: 18.8 mL/min (by C-G formula based on SCr of 1.85 mg/dL (H)). Liver Function Tests: No results for input(s): AST, ALT, ALKPHOS, BILITOT, PROT, ALBUMIN in the last 168 hours. No results for input(s): LIPASE, AMYLASE in the last 168 hours. No results for input(s): AMMONIA in the last 168 hours. Coagulation Profile: No results for input(s): INR, PROTIME in the last 168 hours. Cardiac Enzymes:  Recent Labs Lab 06/08/16 1446 06/08/16 2040 06/09/16 0152 06/09/16 0807 06/09/16 1352  TROPONINI 0.12* 0.14* 0.16* 0.60* 0.93*   BNP (last 3 results) No results for input(s): PROBNP in the last 8760 hours. HbA1C: No results for input(s): HGBA1C in the last 72 hours. CBG: No results for input(s): GLUCAP in the last 168 hours. Lipid Profile: No results for input(s): CHOL, HDL, LDLCALC, TRIG, CHOLHDL, LDLDIRECT in the last 72 hours. Thyroid Function Tests:  Recent Labs  06/08/16 1446  TSH 1.934   Anemia Panel:  Recent Labs  06/08/16 1446  VITAMINB12 340  FOLATE 12.9  FERRITIN 52  TIBC 283  IRON 20*  RETICCTPCT 1.6   Sepsis Labs: No results for input(s): PROCALCITON, LATICACIDVEN in the last 168 hours.  No results found for this or any previous visit (from the past 240 hour(s)).       Radiology Studies: Dg Chest 2 View  Result Date: 06/09/2016 CLINICAL DATA:  Shortness of breath . EXAM: CHEST  2 VIEW COMPARISON:  06/08/2016 . FINDINGS: Mediastinum hilar structures normal. Cardiomegaly with normal pulmonary vascularity. No focal infiltrate. Small bilateral pleural effusions. No pneumothorax. Prior cervical and lumbar spine fusion. Diffuse degenerative change. IMPRESSION: Noted on today's exam is cardiomegaly and mild pulmonary venous congestion and  interstitial prominence. Small bilateral pleural effusions. These findings are consistent with mild congestive heart failure. Electronically Signed   By: Marcello Moores  Register   On: 06/09/2016 08:02   Dg Chest 2 View  Result Date: 06/08/2016 CLINICAL DATA:  Shortness of breath, cough, congestion. EXAM: CHEST  2 VIEW COMPARISON:  04/24/2015 FINDINGS: There is hyperinflation of the lungs compatible with COPD. Small bilateral pleural effusions. Heart is borderline enlarged. No confluent airspace opacities. Degenerative changes in the thoracic spine. IMPRESSION: COPD.  Small bilateral effusions. Electronically Signed   By: Rolm Baptise M.D.   On: 06/08/2016 10:19        Scheduled Meds: . aspirin EC  81 mg Oral Daily  . carvedilol  6.25 mg Oral BID WC  . enoxaparin (LOVENOX) injection  30 mg Subcutaneous Q24H  . felodipine  10 mg Oral  Daily  . furosemide  60 mg Intravenous Q12H  . sodium chloride flush  3 mL Intravenous Q12H   Continuous Infusions:   LOS: 0 days    Time spent: 35 mins    Syleena Mchan Arsenio Loader, MD [facility] Pager [phone]   If 7PM-7AM, please contact night-coverage [email or web address] Password [facility] 06/09/2016, 6:51 PM

## 2016-06-09 NOTE — Progress Notes (Signed)
  Echocardiogram 2D Echocardiogram with Definity has been performed.  [name] 06/09/2016, 11:15 AM

## 2016-06-09 NOTE — Progress Notes (Signed)
OT Cancellation Note  Patient Details Name: PAULA BUSENBARK MRN: [id number] DOB: [birth year]   Cancelled Treatment:    Reason Eval/Treat Not Completed: Medical issues which prohibited therapy. Troponin trending up. Will hold OT pending echo, and further trop checks.  Emmit Alexanders [facility] 06/09/2016, 8:59 AM

## 2016-06-09 NOTE — Progress Notes (Signed)
Patient alert and oriented, denies pain, VSS, SR on the monitor. Will continue to monitor the patient.

## 2016-06-09 NOTE — Progress Notes (Addendum)
PT Cancellation Note  Patient Details Name: [name] MRN: [id number] DOB: 1924/01/05   Cancelled Treatment:    Reason Eval/Treat Not Completed: Medical issues which prohibited therapy. Troponin trending up. Will hold PT eval pending echo, and further trop checks.   Lorriane Shire 06/09/2016, 8:38 AM

## 2016-06-09 NOTE — Progress Notes (Signed)
Pt's family members were concerned about the result of Echocardiogram, Paged MD and made him talk with the daughter sitting in bed side, they were wandering if they could discharge today but once they talk with the doctor the plan has been changed to tomorrow, will continue to monitor the patient.

## 2016-06-09 NOTE — Progress Notes (Signed)
Pt educated about safety and importance of bed alarm during the night however pt refuses to be on bed alarm. Will continue to round on patient.   Jackelynn Hosie, RN    

## 2016-06-09 NOTE — Care Management Note (Signed)
Case Management Note  Patient Details  Name: [name] MRN: [id number] Date of Birth: [birth year]  Subjective/Objective:  Admitted with new onset CHF                  Action/Plan: Patient lives alone but her daughter lives close by; PCP: [name], MD; has private insurance with [facility] with prescription drug coverage; awaiting on PT/OT evals for disposition needs home vs SNF  Expected Discharge Date:   possibly 06/12/2016               Expected Discharge Plan:  TBD  Discharge planning Services  CM Consult  Status of Service:  In process, will continue to follow  Sherrilyn Rist [phone] 06/09/2016, 10:53 AM

## 2016-06-10 ENCOUNTER — Encounter (HOSPITAL_COMMUNITY): Payer: Self-pay | Admitting: Emergency Medicine

## 2016-06-10 DIAGNOSIS — I509 Heart failure, unspecified: Secondary | ICD-10-CM

## 2016-06-10 DIAGNOSIS — D509 Iron deficiency anemia, unspecified: Secondary | ICD-10-CM

## 2016-06-10 LAB — BASIC METABOLIC PANEL
ANION GAP: 8 (ref 5–15)
BUN: 43 mg/dL — ABNORMAL HIGH (ref 6–20)
CHLORIDE: 108 mmol/L (ref 101–111)
CO2: 26 mmol/L (ref 22–32)
Calcium: 8.6 mg/dL — ABNORMAL LOW (ref 8.9–10.3)
Creatinine, Ser: 2.22 mg/dL — ABNORMAL HIGH (ref 0.44–1.00)
GFR calc non Af Amer: 18 mL/min — ABNORMAL LOW (ref >60)
GFR, EST AFRICAN AMERICAN: 21 mL/min — AB (ref >60)
Glucose, Bld: 88 mg/dL (ref 65–99)
POTASSIUM: 4.2 mmol/L (ref 3.5–5.1)
SODIUM: 142 mmol/L (ref 135–145)

## 2016-06-10 LAB — MAGNESIUM: Magnesium: 2.2 mg/dL (ref 1.7–2.4)

## 2016-06-10 LAB — CBC
HEMATOCRIT: 29.8 % — AB (ref 36.0–46.0)
HEMOGLOBIN: 9.7 g/dL — AB (ref 12.0–15.0)
MCH: 28.2 pg (ref 26.0–34.0)
MCHC: 32.6 g/dL (ref 30.0–36.0)
MCV: 86.6 fL (ref 78.0–100.0)
PLATELETS: 201 10*3/uL (ref 150–400)
RBC: 3.44 MIL/uL — AB (ref 3.87–5.11)
RDW: 15.9 % — ABNORMAL HIGH (ref 11.5–15.5)
WBC: 8.2 10*3/uL (ref 4.0–10.5)

## 2016-06-10 MED ORDER — FERROUS SULFATE 325 (65 FE) MG PO TABS
325.0000 mg | ORAL_TABLET | Freq: Three times a day (TID) | ORAL | Status: DC
  Administered 2016-06-10 – 2016-06-11 (×2): 325 mg via ORAL
  Filled 2016-06-10 (×2): qty 1

## 2016-06-10 NOTE — Progress Notes (Signed)
Patient remains stable on 7 a to 7 p shift, no complaints of pain or shortness of breath.  Patient aware if she has a stool nursing staff needs to send to be tested for blood.

## 2016-06-10 NOTE — Progress Notes (Signed)
Patient with no complaints or concerns during 7pm - 7am shift.  Jeston Junkins, RN 

## 2016-06-10 NOTE — Progress Notes (Signed)
Pt continues to refuse bed alarm. Will continue to monitor.   Aerith Canal, RN

## 2016-06-10 NOTE — Evaluation (Signed)
Occupational Therapy Evaluation Patient Details Name: [name] MRN: [id number] DOB: 1923-05-06 Today's Date: 06/10/2016    History of Present Illness Pt adm with new onset CHF. PMH - ckd, htn   Clinical Impression   Pt overall at baseline with ADL activity. Educated pt and family regarding safety with bathtub ( grab bar and non skid mat) and having S in bathroom for tub transfer first few times at home. Pt and daughter agreed    Follow Up Recommendations  No OT follow up    Equipment Recommendations  None recommended by OT       Precautions / Restrictions Precautions Precautions: None Restrictions Weight Bearing Restrictions: No      Mobility Bed Mobility Overal bed mobility: Modified Independent                Transfers Overall transfer level: Modified independent Equipment used: Quad cane                  Balance Overall balance assessment: Modified Independent                                          ADL Overall ADL's : At baseline                                             Vision Patient Visual Report: No change from baseline              Pertinent Vitals/Pain Pain Assessment: No/denies pain     Hand Dominance     Extremity/Trunk Assessment Upper Extremity Assessment Upper Extremity Assessment: Overall WFL for tasks assessed   Lower Extremity Assessment Lower Extremity Assessment: Overall WFL for tasks assessed       Communication Communication Communication: No difficulties   Cognition Arousal/Alertness: Awake/alert Behavior During Therapy: WFL for tasks assessed/performed Overall Cognitive Status: Within Functional Limits for tasks assessed                     General Comments               Home Living Family/patient expects to be discharged to:: Private residence Living Arrangements: Alone Available Help at Discharge: Family;Available PRN/intermittently Type of  Home: House Home Access: Level entry     Home Layout: One level     Bathroom Shower/Tub: Tub/shower unit Shower/tub characteristics: Door       Home Equipment: Cane - quad          Prior Functioning/Environment Level of Independence: Independent with assistive device(s)        Comments: Uses quad cane                                  End of Session Nurse Communication: Mobility status  Activity Tolerance: Patient tolerated treatment well Patient left: in bed;with call bell/phone within reach  OT Visit Diagnosis: Unsteadiness on feet (R26.81)                ADL either performed or assessed with clinical judgement  Time: 1225-1241 OT Time Calculation (min): 16 min Charges:  OT General Charges $OT Visit: 1 Procedure OT Evaluation $OT Eval Moderate Complexity: 1 Procedure G-Codes:  Kari Baars, [place]  Payton Mccallum D 06/10/2016, 1:27 PM

## 2016-06-10 NOTE — Progress Notes (Signed)
PROGRESS NOTE    [name]  DUK:[id number] DOB: 1923-05-03 DOA: 06/08/2016 PCP: [name], MD   Brief Narrative:  Patient admitted for severe acute systolic CHF.  Assessment & Plan: Acute Resp Distress with hypoxia Acute systolic CHF. EF 20% Non-STEMI.  -Significant improvement with lasix iv, strict I/Os, daily weight  -echo done -shows EF of 20-25% with LVH as well as global hypokinesis. There is also diastolic dysfunction. Cardiology consulted. Recommend to continue current management. Would not be a candidate for any intervention due to her renal function. -BB added, no ACEI/ARB at this time    Acute on chronic kidney disease stage IV. Likely secondary to diuresis. Renal function is acutely worsened today. Concern is with the upper end. At present I have to hold the diuresis. By holding the diuresis as the patient with low EF is at high risk for volume overload again.  Need Close monitoring. We would like to monitor the renal function in the hospital to ensure downtrend, and identify appropriate diuretic dose.  B/L pleural effusions -likely related to CHF    HTN (hypertension) -Carvedilol as above -For now, no ACE inhibitor/ARB as above    Anemia TSH normal. Hemoglobin dropped 1 g. Critical site unstable. Check FOBT.  DVT prophylaxis:  discontinue Lovenox, mechanical compression. Code Status: Full Disposition Plan:  pending renal function and stabilization of H&H.   Subjective: Patient states she is feeling much better than yesterday but still gets little sob with activity. All of this had started about 2 weeks ago, she denies any chest pain and other complaints at this time.   Objective: Vitals:   06/10/16 1042 06/10/16 1320 06/10/16 1559 06/10/16 1740  BP: 117/68 (!) 95/49 102/68   Pulse: 98 75  80  Resp:  18    Temp: 98 F (36.7 C) 98.1 F (36.7 C)    TempSrc: Oral Oral    SpO2: 98% 97%    Weight:      Height:        Intake/Output  Summary (Last 24 hours) at 06/10/16 1745 Last data filed at 06/10/16 1327  Gross per 24 hour  Intake              600 ml  Output             1170 ml  Net             -570 ml   Filed Weights   06/08/16 1701 06/09/16 0418 06/10/16 0454  Weight: 74.2 kg (163 lb 8 oz) 72.4 kg (159 lb 11.2 oz) 71.8 kg (158 lb 4.8 oz)    Examination:  General exam: Appears calm and comfortable  Respiratory system: diminished BS at b/l bases Cardiovascular system: S1 & S2 heard, RRR. No JVD, murmurs, rubs, gallops or clicks.2+ b/l LE pitting edema. Gastrointestinal system: Abdomen is nondistended, soft and nontender. No organomegaly or masses felt. Normal bowel sounds heard. Central nervous system: Alert and oriented. No focal neurological deficits. Extremities: Symmetric 5 x 5 power. Skin: No rashes, lesions or ulcers Psychiatry: Judgement and insight appear normal. Mood & affect appropriate.     Data Reviewed:   CBC:  Recent Labs Lab 06/08/16 1122 06/10/16 0414  WBC 9.8 8.2  HGB 10.7* 9.7*  HCT 32.5* 29.8*  MCV 86.2 86.6  PLT 223 376   Basic Metabolic Panel:  Recent Labs Lab 06/08/16 1122 06/09/16 0152 06/10/16 0414  NA 142 141 142  K 4.3 3.5 4.2  CL 109 106 108  CO2 23 25 26   GLUCOSE 109* 94 88  BUN 37* 36* 43*  CREATININE 1.84* 1.85* 2.22*  CALCIUM 9.1 8.9 8.6*  MG  --   --  2.2   GFR: Estimated Creatinine Clearance: 15.6 mL/min (by C-G formula based on SCr of 2.22 mg/dL (H)). Liver Function Tests: No results for input(s): AST, ALT, ALKPHOS, BILITOT, PROT, ALBUMIN in the last 168 hours. No results for input(s): LIPASE, AMYLASE in the last 168 hours. No results for input(s): AMMONIA in the last 168 hours. Coagulation Profile: No results for input(s): INR, PROTIME in the last 168 hours. Cardiac Enzymes:  Recent Labs Lab 06/08/16 2040 06/09/16 0152 06/09/16 0807 06/09/16 1352 06/09/16 1916  TROPONINI 0.14* 0.16* 0.60* 0.93* 0.82*   BNP (last 3 results) No results  for input(s): PROBNP in the last 8760 hours. HbA1C: No results for input(s): HGBA1C in the last 72 hours. CBG: No results for input(s): GLUCAP in the last 168 hours. Lipid Profile: No results for input(s): CHOL, HDL, LDLCALC, TRIG, CHOLHDL, LDLDIRECT in the last 72 hours. Thyroid Function Tests:  Recent Labs  06/08/16 1446  TSH 1.934   Anemia Panel:  Recent Labs  06/08/16 1446  VITAMINB12 340  FOLATE 12.9  FERRITIN 52  TIBC 283  IRON 20*  RETICCTPCT 1.6   Sepsis Labs: No results for input(s): PROCALCITON, LATICACIDVEN in the last 168 hours.  No results found for this or any previous visit (from the past 240 hour(s)).       Radiology Studies: Dg Chest 2 View  Result Date: 06/09/2016 CLINICAL DATA:  Shortness of breath . EXAM: CHEST  2 VIEW COMPARISON:  06/08/2016 . FINDINGS: Mediastinum hilar structures normal. Cardiomegaly with normal pulmonary vascularity. No focal infiltrate. Small bilateral pleural effusions. No pneumothorax. Prior cervical and lumbar spine fusion. Diffuse degenerative change. IMPRESSION: Noted on today's exam is cardiomegaly and mild pulmonary venous congestion and interstitial prominence. Small bilateral pleural effusions. These findings are consistent with mild congestive heart failure. Electronically Signed   By: Marcello Moores  Register   On: 06/09/2016 08:02        Scheduled Meds: . aspirin EC  81 mg Oral Daily  . carvedilol  6.25 mg Oral BID WC  . ferrous sulfate  325 mg Oral TID WC  . sodium chloride flush  3 mL Intravenous Q12H   Continuous Infusions:   LOS: 1 day    Time spent: 35 mins    Oviya Ammar, Diamantina Providence, MD [facility] Pager [phone]   If 7PM-7AM, please contact night-coverage [email or web address] Password [facility] 06/10/2016, 5:45 PM

## 2016-06-10 NOTE — Evaluation (Signed)
Physical Therapy Evaluation Patient Details Name: [name] MRN: [id number] DOB: 1923/11/04 Today's Date: 06/10/2016   History of Present Illness  Pt adm with new onset CHF. PMH - ckd, htn  Clinical Impression  Pt doing well with mobility and no further PT needed.  Ready for dc from PT standpoint.      Follow Up Recommendations No PT follow up    Equipment Recommendations  None recommended by PT    Recommendations for Other Services       Precautions / Restrictions Precautions Precautions: None Restrictions Weight Bearing Restrictions: No      Mobility  Bed Mobility Overal bed mobility: Modified Independent                Transfers Overall transfer level: Modified independent Equipment used: Quad cane                Ambulation/Gait Ambulation/Gait assistance: Modified independent (Device/Increase time) Ambulation Distance (Feet): 200 Feet Assistive device: Quad cane Gait Pattern/deviations: Step-through pattern;Decreased stride length     General Gait Details: Pt with steady gait  Stairs            Wheelchair Mobility    Modified Rankin (Stroke Patients Only)       Balance Overall balance assessment: Modified Independent                                           Pertinent Vitals/Pain Pain Assessment: No/denies pain    Home Living Family/patient expects to be discharged to:: Private residence Living Arrangements: Alone Available Help at Discharge: Family;Available PRN/intermittently Type of Home: House Home Access: Level entry     Home Layout: One level Home Equipment: Cane - quad      Prior Function Level of Independence: Independent with assistive device(s)         Comments: Uses quad cane     Hand Dominance        Extremity/Trunk Assessment   Upper Extremity Assessment Upper Extremity Assessment: Defer to OT evaluation    Lower Extremity Assessment Lower Extremity Assessment:  Overall WFL for tasks assessed       Communication   Communication: No difficulties  Cognition Arousal/Alertness: Awake/alert Behavior During Therapy: WFL for tasks assessed/performed Overall Cognitive Status: Within Functional Limits for tasks assessed                      General Comments      Exercises     Assessment/Plan    PT Assessment Patent does not need any further PT services  PT Problem List         PT Treatment Interventions      PT Goals (Current goals can be found in the Care Plan section)  Acute Rehab PT Goals PT Goal Formulation: All assessment and education complete, DC therapy    Frequency     Barriers to discharge        Co-evaluation               End of Session   Activity Tolerance: Patient tolerated treatment well Patient left: in bed;with call bell/phone within reach (sitting EOB)   PT Visit Diagnosis: Difficulty in walking, not elsewhere classified (R26.2)         Time: 8938-1017 PT Time Calculation (min) (ACUTE ONLY): 8 min   Charges:   PT Evaluation $PT Eval  Low Complexity: 1 Procedure     PT G CodesShary Decamp [facility] 2016-06-23, 10:37 AM Suanne Marker PT [phone]

## 2016-06-10 NOTE — Consult Note (Addendum)
**Note Rebecca-Identified via Obfuscation** CARDIOLOGY CONSULT NOTE   Patient ID: DEVETTA Clements MRN: [id number] DOB/AGE: 1924-04-09 83 y.o.  Admit date: 06/08/2016  Requesting Physician: Dr.  Reesa Chew, [place] [facility] Primary Physician:   [name], MD Primary Cardiologist:   Dr. Claiborne Billings, T. (new) Reason for Consultation:   New onset Congestive Heart failure  HPI: [name] is a 83 y.o. female with a history of breast mass, CKD III, and hypertension presented to the emergency department on 2/27 with increasing worsening exertional SOB over the past week with lower extremity edema. Only able to walk short distances at a time. No prior history of CHF.  BNP  997.3, Trop of 0.12, and chest xray showed small bilateral effusions in the ED. ED started on IV Lasix admitted to [facility] for severe vol overload. A cardiac echo was performed yesterday and shows LVEF 20-25%, mild LVH, and severe global hypokinesis with inferior akinesis, diastolic dysfunction with elevated LV filling pressure. Cardiology was asked to consult for medication optimization of new onset CHF.   Past Medical History:  Diagnosis Date  . Breast mass   . Cervical spondylosis   . Hypertension      Past Surgical History:  Procedure Laterality Date  . APPENDECTOMY    . BACK SURGERY     x 4  . CATARACT EXTRACTION    . KNEE SURGERY     x 2  . VENTRAL HERNIA REPAIR N/A 09/14/2013   Procedure: LAPAROSCOPIC ventral HERNIA REPAIR with mesh ;  Surgeon: Shann Medal, MD;  Location: [facility];  Service: General;  Laterality: N/A;    No Known Allergies  I have reviewed the patient's current medications . aspirin EC  81 mg Oral Daily  . carvedilol  6.25 mg Oral BID WC  . sodium chloride flush  3 mL Intravenous Q12H    sodium chloride, acetaminophen, ondansetron (ZOFRAN) IV, sodium chloride flush  Prior to Admission medications   Medication Sig Start Date End Date Taking? Authorizing Provider  acetaminophen (TYLENOL) 325 MG tablet Take 650 mg by  mouth every 6 (six) hours as needed for mild pain.   Yes Historical Provider, MD  allopurinol (ZYLOPRIM) 100 MG tablet Take 100 mg by mouth daily.  09/26/13  Yes Historical Provider, MD  aspirin EC 81 MG tablet Take 81 mg by mouth every morning.   Yes Historical Provider, MD  felodipine (PLENDIL) 10 MG 24 hr tablet Take 10 mg by mouth every morning.   Yes Historical Provider, MD  furosemide (LASIX) 20 MG tablet Take 20 mg by mouth daily.   Yes Historical Provider, MD  Multiple Vitamins-Minerals (PRESERVISION AREDS PO) Take 1 capsule by mouth daily.    Yes Historical Provider, MD  NONFORMULARY OR COMPOUNDED [place] compound:  Peripheral Neuropathy cream - Bupivacaine 1%, Doxepin 3%, Gabapentin 6%, Pentoxifylline 3%, Topiramate 1%, dispense 120gram apply 102 grams to affected area 3-4 times daily, +3refills. 07/01/15  Yes Max Villa Herb, DPM     Social History   Social History  . Marital status: Widowed    Spouse name: N/A  . Number of children: N/A  . Years of education: N/A   Occupational History  . Not on file.   Social History Main Topics  . Smoking status: Former Smoker    Types: Cigarettes  . Smokeless tobacco: Never Used  . Alcohol use No  . Drug use: No  . Sexual activity: Not on file   Other Topics Concern  .  Not on file   Social History Narrative  . No narrative on file    Family Status  Relation Status  . Mother Deceased at age 13's  . Father Deceased at age 12's  . Sister Deceased at age 54  . Brother Deceased at age 14   Family History  Problem Relation Age of Onset  . Diabetes Mother   . Hypertension Mother   . Cancer Father     uknown kind of cancer  . Diabetes Sister       ROS:  Full 14 point review of systems complete and found to be negative unless listed above.  Physical Exam:  Blood pressure (!) 95/49, pulse 75, temperature 98.1 F (36.7 C), temperature source Oral, resp. rate 18, height _0  (1.575 m), weight 158 lb 4.8 oz (71.8 kg),  SpO2 97 %.  General: Well developed, well nourished, female in no acute distress Head: Eyes PERRLA, No xanthomas.   Normocephalic and atraumatic, oropharynx without edema or exudate. Dentition:  Lungs:  Heart: HRRR S1 S2, no rub/gallop, Heart irregular rate and rhythm with S1, S2  murmur. pulses are 2+ extrem.   Neck: No carotid bruits. No lymphadenopathy.  Abdomen: Bowel sounds present, abdomen soft and non-tender without masses or hernias noted. Msk:  No spine or cva tenderness. No weakness, no joint deformities or effusions. Extremities: No clubbing or cyanosis. mild edema, no pitting edema Neuro: Alert and oriented X 3. No focal deficits noted. Psych:  Good affect, responds appropriately Skin: No rashes or lesions noted.  Labs:   Lab Results  Component Value Date   WBC 8.2 06/10/2016   HGB 9.7 (L) 06/10/2016   HCT 29.8 (L) 06/10/2016   MCV 86.6 06/10/2016   PLT 201 06/10/2016    Recent Labs Lab 06/10/16 0414  NA 142  K 4.2  CL 108  CO2 26  BUN 43*  CREATININE 2.22*  CALCIUM 8.6*  GLUCOSE 88   Magnesium  Date Value Ref Range Status  06/10/2016 2.2 1.7 - 2.4 mg/dL Final    Recent Labs  06/09/16 0152 06/09/16 0807 06/09/16 1352 06/09/16 1916  TROPONINI 0.16* 0.60* 0.93* 0.82*    Recent Labs  06/08/16 1155  TROPIPOC 0.12*    TSH  Date/Time Value Ref Range Status  06/08/2016 02:46 PM 1.934 0.350 - 4.500 uIU/mL Final    Comment:    Performed by a 3rd Generation assay with a functional sensitivity of <=0.01 uIU/mL.   Vitamin B-12  Date/Time Value Ref Range Status  06/08/2016 02:46 PM 340 180 - 914 pg/mL Final    Comment:    (NOTE) This assay is not validated for testing neonatal or myeloproliferative syndrome specimens for Vitamin B12 levels.    Folate  Date/Time Value Ref Range Status  06/08/2016 02:46 PM 12.9 >5.9 ng/mL Final   Ferritin  Date/Time Value Ref Range Status  06/08/2016 02:46 PM 52 11 - 307 ng/mL Final   TIBC  Date/Time Value  Ref Range Status  06/08/2016 02:46 PM 283 250 - 450 ug/dL Final   Iron  Date/Time Value Ref Range Status  06/08/2016 02:46 PM 20 (L) 28 - 170 ug/dL Final   Retic Ct Pct  Date/Time Value Ref Range Status  06/08/2016 02:46 PM 1.6 0.4 - 3.1 % Final    Echo:  Transthoracic Echocardiography 06/09/2016  Study Conclusions  - Left ventricle: The cavity size was normal. Wall thickness was   increased in a pattern of mild LVH. Systolic function was  severely reduced. The estimated ejection fraction was in the   range of 20% to 25%. No apical thrombus with Definity contrast.   Global hypokinesis with inferior akinesis. Doppler parameters are   consistent with abnormal left ventricular relaxation (grade 1   diastolic dysfunction). The E/e&' ratio is >15, suggesting   elevated LV filling pressure. - Aortic valve: Trileaflet; mildly calcified leaflets. There was no   stenosis. There was no regurgitation. - Mitral valve: Calcified annulus. Mildly thickened leaflets .   There was moderate regurgitation. - Left atrium: Moderately dilated. - Tricuspid valve: There was mild regurgitation. - Pulmonary arteries: PA peak pressure: 31 mm Hg (S). - Inferior vena cava: The vessel was normal in size. The   respirophasic diameter changes were in the normal range (= 50%),   consistent with normal central venous pressure.  Impressions:  - LVEF 20-25%, mild LVH, severe global hypokinesis with inferior   akinesis, diastolic dysfunction with elevated LV filling   pressure, aortic valve calcification without stenosis, MAC with   moderate MR, moderate LAE, mild TR, RVSP 31 mmHg, normal IVC.  ECG: HR 84, NSR, prolonged QT- personally reviewed  Radiology:    Dg Chest 2 View Result Date: 06/09/2016  IMPRESSION: Noted on today's exam is cardiomegaly and mild pulmonary venous congestion and interstitial prominence. Small bilateral pleural effusions. These findings are consistent with mild congestive  heart failure. Electronically Signed   By: Marcello Moores  Register   On: 06/09/2016 08:02    ASSESSMENT AND PLAN:    Principal Problem:   New onset of congestive heart failure (HCC) Active Problems:   HTN (hypertension)   CKD (chronic kidney disease) stage 4, GFR 15-29 ml/min (HCC)   Anemia   Bilateral pleural effusion   Elevated troponin   Acute congestive heart failure (HCC)  Acute Congestive Heart failure: A cardiac echo was performed yesterday and shows LVEF 20-25%, mild LVH, and severe global hypokinesis with inferior akinesis, diastolic dysfunction with elevated LV filling pressure. Due to age and kidney function not a candidate for cath. May need Nuclear stress test and would treat medically for ischemia. She has had 5 lb weight loss in past 3 days. Started on BB yesterday. Does not look clinically fluid overload -continue strict I/Os, daily weight   CKD (chronic kidney disease) stage 4, GFR 15-29 ml/min: She was on Valsartan for years and taken off by Nephrology ~ November 2017, was not on any hypertensive for a few months then started developing le edema. - Renal function is worse today than yesterday  ( 37 / 1.84 ) and today  ( 43 / 2.22 ) - Continue to monitor closely with diuresis  Elevated trops/NSTEMI Troponin is starting to trend down 0.16  <<  0.60  <<  0.93  >>  0.82  likely demand ischemia  - On aspirin, order lipid panel to assess need for Statin??  B/L pleural effusions -likely related to CHF - start on 40 mg IV Lasix daily with improvement of kidney function ??  HTN (hypertension) Has been started on BB by internal medicine. Blood pressures have ranged between  95/49 and 137/72 today.   Anemia: Internal medicine to manage.  SignedLinus Mako, PA-C 06/10/2016 2:29 PM   Patient seen and examined. Agree with assessment and plan.  [name] is a very pleasant 83 year old active  African-American female who has a history of hypertension, chronic kidney  disease as well as a history of a breast mass.  She was recently told by  her primary physician to discontinue valsartan.  The patient admitted to leg and ankle swelling.  The past week she has noticed progressive lower extremity edema, as well as exertional dyspnea.  She denies chest tightness.  She was seen by her primary care doctor, Dr. Jeanann Clements who advised hospital admission due to CHF.  She was admitted on 06/08/2016.  She has had mild troponin elevation most likely due to demand ischemia.  An echo Doppler study has shown severe global LV dysfunction with an ejection fraction of 20 to 25% with global hypokinesis and inferior akinesis.  There is grade 1 diastolic dysfunction.  Tissue Doppler is abnormal, suggestive of elevation of LV filling pressures.  There was evidence for aortic valve sclerosis without stenosis, mitral annular calcification with moderate MR, moderate LA enlargement, and mildly increased.  PA pressure estimated at 31 mm.  She has been treated with IV diuresis and this has resulted in worsening of renal function with a creatinine now increased to 2.22.  She has iron deficiency with a saturation of 7%.  BNP was elevated at 997.  He denies any history of chest pressure or palpitations.  She denies presyncope or syncope.  Her BP today is low at 95/50 with a pulse in the 70s to 80s in sinus rhythm.  JVD approximated 7-8 cm.  She has decreased breath sounds at her bases.  Rhythm was regular.  There was a 1/6 systolic murmur with no S3 gallop.  Abdomen was soft with positive bowel sounds.  There was trace ankle edema bilaterally.  Neurologic exam was grossly nonfocal.  She is no longer on ARB therapy due to her renal function and her diuretics are on hold.  Ultimately, once her blood pressure improves, consider initiation of hydralazine/with possible nitrate therapy for preload reduction.  Her ECG is not consistent with an acute coronary syndrome and on independent review by me shows normal  sinus rhythm at 82 without significant ST-T changes, but QTC interval is increased at 497 ms. .  At present, would continue carvedilol at the present dose of 6.25 mg twice a day.  She denies any antecedent viral syndrome.  Would initiate iron replacement with iron saturation of 7%.  Check stool guaiacs.  We will follow patient with you.   Troy Sine, MD, [facility] 06/10/2016 3:37 PM

## 2016-06-11 LAB — BASIC METABOLIC PANEL
Anion gap: 12 (ref 5–15)
BUN: 43 mg/dL — ABNORMAL HIGH (ref 6–20)
CALCIUM: 8.8 mg/dL — AB (ref 8.9–10.3)
CO2: 24 mmol/L (ref 22–32)
CREATININE: 2.19 mg/dL — AB (ref 0.44–1.00)
Chloride: 103 mmol/L (ref 101–111)
GFR, EST AFRICAN AMERICAN: 22 mL/min — AB (ref >60)
GFR, EST NON AFRICAN AMERICAN: 19 mL/min — AB (ref >60)
GLUCOSE: 96 mg/dL (ref 65–99)
Potassium: 3.7 mmol/L (ref 3.5–5.1)
Sodium: 139 mmol/L (ref 135–145)

## 2016-06-11 LAB — CBC
HCT: 32 % — ABNORMAL LOW (ref 36.0–46.0)
Hemoglobin: 10.6 g/dL — ABNORMAL LOW (ref 12.0–15.0)
MCH: 28.7 pg (ref 26.0–34.0)
MCHC: 33.1 g/dL (ref 30.0–36.0)
MCV: 86.7 fL (ref 78.0–100.0)
PLATELETS: 197 10*3/uL (ref 150–400)
RBC: 3.69 MIL/uL — ABNORMAL LOW (ref 3.87–5.11)
RDW: 16.3 % — ABNORMAL HIGH (ref 11.5–15.5)
WBC: 8 10*3/uL (ref 4.0–10.5)

## 2016-06-11 LAB — MAGNESIUM: MAGNESIUM: 2.2 mg/dL (ref 1.7–2.4)

## 2016-06-11 MED ORDER — ISOSORBIDE MONONITRATE ER 30 MG PO TB24
15.0000 mg | ORAL_TABLET | Freq: Every day | ORAL | Status: DC
  Administered 2016-06-11: 15 mg via ORAL
  Filled 2016-06-11: qty 1

## 2016-06-11 MED ORDER — HYDRALAZINE HCL 10 MG PO TABS: 10.0000 mg | ORAL_TABLET | Freq: Three times a day (TID) | ORAL | Status: DC

## 2016-06-11 MED ORDER — HYDRALAZINE HCL 25 MG PO TABS: 12.5000 mg | ORAL_TABLET | Freq: Three times a day (TID) | ORAL | 0 refills | Status: DC

## 2016-06-11 MED ORDER — FUROSEMIDE 20 MG PO TABS
20.0000 mg | ORAL_TABLET | Freq: Every day | ORAL | Status: DC
  Administered 2016-06-11: 20 mg via ORAL
  Filled 2016-06-11: qty 1

## 2016-06-11 MED ORDER — ISOSORBIDE MONONITRATE ER 30 MG PO TB24: 15.0000 mg | ORAL_TABLET | Freq: Every day | ORAL | 0 refills | Status: DC

## 2016-06-11 MED ORDER — FERROUS SULFATE 325 (65 FE) MG PO TABS: 325.0000 mg | ORAL_TABLET | Freq: Three times a day (TID) | ORAL | 0 refills | Status: DC

## 2016-06-11 MED ORDER — CARVEDILOL 6.25 MG PO TABS: 6.2500 mg | ORAL_TABLET | Freq: Two times a day (BID) | ORAL | 0 refills | Status: DC

## 2016-06-11 MED ORDER — HYDRALAZINE HCL 25 MG PO TABS: 12.5000 mg | ORAL_TABLET | Freq: Three times a day (TID) | ORAL | Status: DC

## 2016-06-11 NOTE — Progress Notes (Signed)
Progress Note  Patient Name: [name] Date of Encounter: 06/11/2016  Primary Cardiologist: New- Dr. Claiborne Billings  Subjective   Pt is feeling very well this morning. No chest pain, shortness of breath, orthopnea, or lightheadedness. She has been up moving around the room with problems. She feels that she has more energy and now back to her normal. She is hoping to go home today.  Inpatient Medications    Scheduled Meds: . aspirin EC  81 mg Oral Daily  . carvedilol  6.25 mg Oral BID WC  . ferrous sulfate  325 mg Oral TID WC  . sodium chloride flush  3 mL Intravenous Q12H   Continuous Infusions:  PRN Meds: sodium chloride, acetaminophen, ondansetron (ZOFRAN) IV, sodium chloride flush   Vital Signs    Vitals:   06/10/16 1559 06/10/16 1740 06/10/16 2131 06/11/16 0525  BP: 102/68  122/70 131/75  Pulse:  80 77 78  Resp:   20 20  Temp:   98.1 F (36.7 C) 97.9 F (36.6 C)  TempSrc:   Oral Oral  SpO2:   96% 94%  Weight:    160 lb 1.6 oz (72.6 kg)  Height:        Intake/Output Summary (Last 24 hours) at 06/11/16 0929 Last data filed at 06/11/16 0998  Gross per 24 hour  Intake              840 ml  Output             1660 ml  Net             -820 ml   Filed Weights   06/09/16 0418 06/10/16 0454 06/11/16 0525  Weight: 159 lb 11.2 oz (72.4 kg) 158 lb 4.8 oz (71.8 kg) 160 lb 1.6 oz (72.6 kg)    Telemetry    NSR 70-80's with occ PVC's - Personally Reviewed  ECG    No new treacings  Physical Exam   GEN: No acute distress.   Neck: No JVD Cardiac: RRR, no murmurs, rubs, or gallops.  Respiratory: Clear to auscultation bilaterally. GI: Soft, nontender, non-distended  MS: No edema; No deformity. Neuro:  Nonfocal  Psych: Normal affect   Labs    Chemistry Recent Labs Lab 06/09/16 0152 06/10/16 0414 06/11/16 0334  NA 141 142 139  K 3.5 4.2 3.7  CL 106 108 103  CO2 _0 GLUCOSE 94 88 96  BUN 36* 43* 43*  CREATININE 1.85* 2.22* 2.19*  CALCIUM 8.9 8.6*  8.8*  GFRNONAA 23* 18* 19*  GFRAA 27* 21* 22*  ANIONGAP _1 Hematology Recent Labs Lab 06/08/16 1122 06/08/16 1446 06/10/16 0414 06/11/16 0334  WBC 9.8  --  8.2 8.0  RBC 3.77* 3.72* 3.44* 3.69*  HGB 10.7*  --  9.7* 10.6*  HCT 32.5*  --  29.8* 32.0*  MCV 86.2  --  86.6 86.7  MCH 28.4  --  28.2 28.7  MCHC 32.9  --  32.6 33.1  RDW 16.0*  --  15.9* 16.3*  PLT 223  --  201 197    Cardiac Enzymes Recent Labs Lab 06/09/16 0152 06/09/16 0807 06/09/16 1352 06/09/16 1916  TROPONINI 0.16* 0.60* 0.93* 0.82*    Recent Labs Lab 06/08/16 1155  TROPIPOC 0.12*     BNP Recent Labs Lab 06/08/16 1122  BNP 997.3*     DDimer No results for input(s): DDIMER in the last 168 hours.   Radiology  No results found.  Cardiac Studies   Transthoracic Echocardiography 06/09/2016  Study Conclusions  - Left ventricle: The cavity size was normal. Wall thickness was increased in a pattern of mild LVH. Systolic function was severely reduced. The estimated ejection fraction was in the range of 20% to 25%. No apical thrombus with Definity contrast. Global hypokinesis with inferior akinesis. Doppler parameters are consistent with abnormal left ventricular relaxation (grade 1 diastolic dysfunction). The E/e&' ratio is >15, suggesting elevated LV filling pressure. - Aortic valve: Trileaflet; mildly calcified leaflets. There was no stenosis. There was no regurgitation. - Mitral valve: Calcified annulus. Mildly thickened leaflets . There was moderate regurgitation. - Left atrium: Moderately dilated. - Tricuspid valve: There was mild regurgitation. - Pulmonary arteries: PA peak pressure: 31 mm Hg (S). - Inferior vena cava: The vessel was normal in size. The respirophasic diameter changes were in the normal range (= 50%), consistent with normal central venous pressure.  Impressions:  - LVEF 20-25%, mild LVH, severe global hypokinesis with  inferior akinesis, diastolic dysfunction with elevated LV filling pressure, aortic valve calcification without stenosis, MAC with moderate MR, moderate LAE, mild TR, RVSP 31 mmHg, normal IVC.   Patient Profile     83 y.o. female with a history of breast mass, CKD III, and hypertension presented to the emergency department on 2/27 with increasing worsening exertional SOB over the past week with lower extremity edema.  Echo  shows LVEF 20-25%. Cardiology was asked to consult for medication optimization of new onset CHF.  Assessment & Plan    Acute combined systolic and diastolic heart failure -EF 14-43%, Diastolic dysfunction with elevated filling pressures, Severe global hypokinesis with inferior akinesis, AV calcification without stenosis, MAC with moderate MR, moderate LAE -Given age and kidney function patient is not an ideal candidate for cath. May benefit from nuclear stress test. For now treat medically -Diuresis has been per IM. Received Lasix 60 mg IV bid. Last dose 2/28. She is has a net negative fluid balance 3.9L, weight was down 5 lbs, back up 2 lbs since yesterday with discontinuation of lasix due to renal function. -Imdur added for preload reduction -Pt is feeling much better and tolerating activity without symptoms.   CKD stage 4 -She was on valsartan for years, taken off by nephrology in 02/2016. Was not on any antihypertensives for a few months then started developing LE edema.  -Renal function worsened with diuresis SCr 1.84-->1.85-->2.22-->2.19.  -Lasix 20 mg daily per IM  Elevated troponins -Mildly elevated troponins in flat pattern. Likely demand ischemia -On aspirin and beta blocker, nitrate -EKG with no acute ischemia. No chest pain  Hypertension -BP currently well controlled on carvedilol, Imdur, lasix  Anemia -Management by IM  Signed, Daune Perch, NP  06/11/2016, 9:29 AM    Patient seen and examined. Agree with assessment and plan. I/O since  admission -3931 since admission.  Breathing better; no chest pain.  Will add hydralazine initially 12.5 mg tid to nitrates. Cr slightly improved today. Now on iron ? w/u for Fe deficiency.    Troy Sine, MD, [facility] 06/11/2016 10:51 AM

## 2016-06-11 NOTE — Progress Notes (Signed)
Patient with no complaints or concerns during 7pm - 7am shift.  Alcie Runions, RN 

## 2016-06-15 NOTE — Discharge Summary (Signed)
[facility] Discharge Summary   Patient: [name] KGM:[id number]   PCP: Rebecca Spurling, MD DOB: 10-23-1923   Date of admission: 06/08/2016   Date of discharge: 06/11/2016    Discharge Diagnoses:  Principal Problem:   New onset of congestive heart failure (HCC) Active Problems:   HTN (hypertension)   CKD (chronic kidney disease) stage 4, GFR 15-29 ml/min (HCC)   Anemia   Bilateral pleural effusion   Elevated troponin   Acute congestive heart failure ([place])   Admitted From: home Disposition:  home  Recommendations for Outpatient Follow-up:  1. Follow-up with PCP in 1-2 weeks. Will need a GI referral for iron deficiency anemia workup. Repeat CBC in 1 week as well as BMP. 2. Follow-up with cardiology as recommended.   Follow-up Information    Rebecca Spurling, MD. Schedule an appointment as soon as possible for a visit in 1 week(s).   Specialty:  Internal Medicine Why:  repeat CBC, need gi referral for iron deficiency anemia work up. need repeat BMP.  Contact information: [address] [place] [address] [phone]        [facility] [employer]. Schedule an appointment as soon as possible for a visit in 1 month(s).   Specialty:  Cardiology Why:  for follow up on CHF Contact information: [address] [place] [phone]         Diet recommendation: Cardiac diet  Activity: The patient is advised to gradually reintroduce usual activities.  Discharge Condition: good  Code Status: full code  History of present illness: As per the H and P dictated on admission, "[name] is a 83 y.o. female with medical history significant for hypertension, stage IV chronic kidney disease, history of prior ventral hernia repair and partial bowel obstruction. Patient began developing edema 3 weeks ago. PCP began Lasix 20 mg daily without any change in the patient's edema. She has been short of breath  with dyspnea on exertion 1 week. Her weight has varied between 163 and 166 pounds dry weight unknown. No prior history of CHF."  Hospital Course:   Summary of her active problems in the hospital is as following. Acute Resp Distress with hypoxia Acute systolic CHF. EF 20% Non-STEMI.  -Significant improvement with lasix iv, strict I/Os, daily weight  -echo done -shows EF of 20-25% with LVH as well as global hypokinesis. There is also diastolic dysfunction. Cardiology consulted. Recommend to continue current management. Would not be a candidate for any intervention due to her renal function. -BB added, no ACEI/ARB at this time    Acute on chronic kidney disease stage IV. Likely secondary to diuresis. Renal function is acutely worsened during hospitalization but later on stable. Follow up with PCP with BMP  B/L pleural effusions -likely related to CHF  HTN (hypertension) -Carvedilol as above -For now, no ACE inhibitor/ARB as above  Anemia TSH normal. Hemoglobin dropped 1 g. Followed by that stable, no active bleed.   All other chronic medical condition were stable during the hospitalization.  Patient was ambulatory without any assistance. Also seen by physical therapy, who recommended no further therapy requirement On the day of the discharge the patient's vitals were stable, and no other acute medical condition were reported by patient. the patient was felt safe to be discharge at home with family.  Procedures and Results:  Echocardiogram   Study Conclusions  - Left ventricle: The cavity size was normal. Wall thickness was  increased in a pattern of mild LVH. Systolic function was   severely reduced. The estimated ejection fraction was in the   range of 20% to 25%. No apical thrombus with Definity contrast.   Global hypokinesis with inferior akinesis. Doppler parameters are   consistent with abnormal left ventricular relaxation (grade 1   diastolic  dysfunction). The E/e&' ratio is >15, suggesting   elevated LV filling pressure. - Aortic valve: Trileaflet; mildly calcified leaflets. There was no   stenosis. There was no regurgitation. - Mitral valve: Calcified annulus. Mildly thickened leaflets .   There was moderate regurgitation. - Left atrium: Moderately dilated. - Tricuspid valve: There was mild regurgitation. - Pulmonary arteries: PA peak pressure: 31 mm Hg (S). - Inferior vena cava: The vessel was normal in size. The   respirophasic diameter changes were in the normal range (= 50%),   consistent with normal central venous pressure.  Impressions:  - LVEF 20-25%, mild LVH, severe global hypokinesis with inferior   akinesis, diastolic dysfunction with elevated LV filling   pressure, aortic valve calcification without stenosis, MAC with   moderate MR, moderate LAE, mild TR, RVSP 31 mmHg, normal IVC.  Consultations:  Cardiology  DISCHARGE MEDICATION: Discharge Medication List as of 06/11/2016  4:02 PM    START taking these medications   Details  carvedilol (COREG) 6.25 MG tablet Take 1 tablet (6.25 mg total) by mouth 2 (two) times daily with a meal., Starting Fri 06/11/2016, Normal    ferrous sulfate 325 (65 FE) MG tablet Take 1 tablet (325 mg total) by mouth 3 (three) times daily with meals., Starting Fri 06/11/2016, Normal    hydrALAZINE (APRESOLINE) 25 MG tablet Take 0.5 tablets (12.5 mg total) by mouth every 8 (eight) hours., Starting Fri 06/11/2016, Normal    isosorbide mononitrate (IMDUR) 30 MG 24 hr tablet Take 0.5 tablets (15 mg total) by mouth daily., Starting Sat 06/12/2016, Normal      CONTINUE these medications which have NOT CHANGED   Details  acetaminophen (TYLENOL) 325 MG tablet Take 650 mg by mouth every 6 (six) hours as needed for mild pain., Historical Med    allopurinol (ZYLOPRIM) 100 MG tablet Take 100 mg by mouth daily. , Starting Wed 09/26/2013, Historical Med    aspirin EC 81 MG tablet Take 81 mg by  mouth every morning., Until Discontinued, Historical Med    furosemide (LASIX) 20 MG tablet Take 20 mg by mouth daily., Historical Med    Multiple Vitamins-Minerals (PRESERVISION AREDS PO) Take 1 capsule by mouth daily. , Historical Med    NONFORMULARY OR COMPOUNDED [place] compound:  Peripheral Neuropathy cream - Bupivacaine 1%, Doxepin 3%, Gabapentin 6%, Pentoxifylline 3%, Topiramate 1%, dispense 120gram apply 102 grams to affected area 3-4 times daily, +3refills., Print      STOP taking these medications     felodipine (PLENDIL) 10 MG 24 hr tablet        No Known Allergies Discharge Instructions    Diet - low sodium heart healthy    Complete by:  As directed    Discharge instructions    Complete by:  As directed    It is important that you read following instructions as well as go over your medication list with RN to help you understand your care after this hospitalization.  Discharge Instructions: Please follow-up with PCP in one week  Please request your primary care physician to go over all Hospital Tests and Procedure/Radiological results at the follow up,  Please  get all Hospital records sent to your PCP by signing hospital release before you go home.   Do not take more than prescribed Pain, Sleep and Anxiety Medications. You were cared for by a hospitalist during your hospital stay. If you have any questions about your discharge medications or the care you received while you were in the hospital after you are discharged, you can call the unit and ask to speak with the hospitalist on call if the hospitalist that took care of you is not available.  Once you are discharged, your primary care physician will handle any further medical issues. Please note that NO REFILLS for any discharge medications will be authorized once you are discharged, as it is imperative that you return to your primary care physician (or establish a relationship with a primary care physician  if you do not have one) for your aftercare needs so that they can reassess your need for medications and monitor your lab values. You Must read complete instructions/literature along with all the possible adverse reactions/side effects for all the Medicines you take and that have been prescribed to you. Take any new Medicines after you have completely understood and accept all the possible adverse reactions/side effects. Wear Seat belts while driving. If you have smoked or chewed Tobacco in the last 2 yrs please stop smoking and/or stop any Recreational drug use.   Increase activity slowly    Complete by:  As directed      Discharge Exam: Filed Weights   06/09/16 0418 06/10/16 0454 06/11/16 0525  Weight: 72.4 kg (159 lb 11.2 oz) 71.8 kg (158 lb 4.8 oz) 72.6 kg (160 lb 1.6 oz)   Vitals:   06/11/16 0525 06/11/16 0952  BP: 131/75 132/80  Pulse: 78 84  Resp: 20   Temp: 97.9 F (36.6 C)    General: Appear in no distress, no Rash; Oral Mucosa moist. Cardiovascular: S1 and S2 Present, no Murmur, no JVD Respiratory: Bilateral Air entry present and Clear to Auscultation, no Crackles, no wheezes Abdomen: Bowel Sound present, Soft and no tenderness Extremities: no Pedal edema, no calf tenderness Neurology: Grossly no focal neuro deficit.  The results of significant diagnostics from this hospitalization (including imaging, microbiology, ancillary and laboratory) are listed below for reference.    Significant Diagnostic Studies: Dg Chest 2 View  Result Date: 06/09/2016 CLINICAL DATA:  Shortness of breath . EXAM: CHEST  2 VIEW COMPARISON:  06/08/2016 . FINDINGS: Mediastinum hilar structures normal. Cardiomegaly with normal pulmonary vascularity. No focal infiltrate. Small bilateral pleural effusions. No pneumothorax. Prior cervical and lumbar spine fusion. Diffuse degenerative change. IMPRESSION: Noted on today's exam is cardiomegaly and mild pulmonary venous congestion and interstitial  prominence. Small bilateral pleural effusions. These findings are consistent with mild congestive heart failure. Electronically Signed   By: Marcello Moores  Register   On: 06/09/2016 08:02   Dg Chest 2 View  Result Date: 06/08/2016 CLINICAL DATA:  Shortness of breath, cough, congestion. EXAM: CHEST  2 VIEW COMPARISON:  04/24/2015 FINDINGS: There is hyperinflation of the lungs compatible with COPD. Small bilateral pleural effusions. Heart is borderline enlarged. No confluent airspace opacities. Degenerative changes in the thoracic spine. IMPRESSION: COPD.  Small bilateral effusions. Electronically Signed   By: Rolm Baptise M.D.   On: 06/08/2016 10:19    Microbiology: No results found for this or any previous visit (from the past 240 hour(s)).   Labs: CBC:  Recent Labs Lab 06/10/16 0414 06/11/16 0334  WBC 8.2 8.0  HGB 9.7* 10.6*  HCT 29.8* 32.0*  MCV 86.6 86.7  PLT 201 657   Basic Metabolic Panel:  Recent Labs Lab 06/09/16 0152 06/10/16 0414 06/11/16 0334  NA 141 142 139  K 3.5 4.2 3.7  CL 106 108 103  CO2 _0 GLUCOSE 94 88 96  BUN 36* 43* 43*  CREATININE 1.85* 2.22* 2.19*  CALCIUM 8.9 8.6* 8.8*  MG  --  2.2 2.2   Cardiac Enzymes:  Recent Labs Lab 06/08/16 2040 06/09/16 0152 06/09/16 0807 06/09/16 1352 06/09/16 1916  TROPONINI 0.14* 0.16* 0.60* 0.93* 0.82*   BNP (last 3 results)  Recent Labs  06/08/16 1122  BNP 997.3*   CBG: No results for input(s): GLUCAP in the last 168 hours. Time spent: 30 minutes  Signed:  Berle Mull  [facility] 06/11/2016, 4:37 PM

## 2016-07-12 ENCOUNTER — Ambulatory Visit: Payer: Medicare Other | Admitting: Cardiology

## 2016-07-16 ENCOUNTER — Ambulatory Visit (INDEPENDENT_AMBULATORY_CARE_PROVIDER_SITE_OTHER): Payer: Medicare Other | Admitting: Cardiology

## 2016-07-16 ENCOUNTER — Encounter: Payer: Self-pay | Admitting: Cardiology

## 2016-07-16 ENCOUNTER — Other Ambulatory Visit: Payer: Self-pay | Admitting: *Deleted

## 2016-07-16 VITALS — BP 142/80 | HR 94 | Ht 63.0 in | Wt 176.0 lb

## 2016-07-16 DIAGNOSIS — I110 Hypertensive heart disease with heart failure: Secondary | ICD-10-CM

## 2016-07-16 DIAGNOSIS — I509 Heart failure, unspecified: Secondary | ICD-10-CM | POA: Diagnosis not present

## 2016-07-16 DIAGNOSIS — I10 Essential (primary) hypertension: Secondary | ICD-10-CM | POA: Diagnosis not present

## 2016-07-16 DIAGNOSIS — N184 Chronic kidney disease, stage 4 (severe): Secondary | ICD-10-CM | POA: Diagnosis not present

## 2016-07-16 DIAGNOSIS — I420 Dilated cardiomyopathy: Secondary | ICD-10-CM

## 2016-07-16 DIAGNOSIS — R778 Other specified abnormalities of plasma proteins: Secondary | ICD-10-CM

## 2016-07-16 DIAGNOSIS — I5040 Unspecified combined systolic (congestive) and diastolic (congestive) heart failure: Secondary | ICD-10-CM

## 2016-07-16 DIAGNOSIS — R7989 Other specified abnormal findings of blood chemistry: Secondary | ICD-10-CM

## 2016-07-16 DIAGNOSIS — R748 Abnormal levels of other serum enzymes: Secondary | ICD-10-CM

## 2016-07-16 MED ORDER — FUROSEMIDE 40 MG PO TABS: ORAL_TABLET | ORAL | 6 refills | Status: DC

## 2016-07-16 MED ORDER — HYDRALAZINE HCL 25 MG PO TABS: 25.0000 mg | ORAL_TABLET | Freq: Two times a day (BID) | ORAL | 6 refills | Status: DC

## 2016-07-16 MED ORDER — ISOSORBIDE MONONITRATE ER 30 MG PO TB24: 30.0000 mg | ORAL_TABLET | Freq: Every day | ORAL | 6 refills | Status: DC

## 2016-07-16 NOTE — Patient Instructions (Addendum)
Please have labs done on Monday July 19 2016 [address] BNP BMP  Anytime on Monday.   Weight yourself daily -  Call office on Monday t olet [place] know what your weigh If weight is not 5 lbs from Friday - You are to take 40 mg twice a day until weight is down. If weight is 5 lbs less than Friday weight- you will take 40 mg in morning and 20 mg in afternoon @ 3 pm daily    MEDICATIONS CHANGES  -imdur (isosorbide mononitrate) 30 mg once a day  -hydralazine 25 mg one tablet twice a day  -lasix ( furosemide ) 40 mg  In the morning and 20 mg in the afternoon  (depends on your weight)     Your physician recommends that you schedule a follow-up appointment in 2 weeks with dr Claiborne Billings or extender. Patient will need follow up wth Dr Claiborne Billings in the future

## 2016-07-16 NOTE — Progress Notes (Signed)
PCP: [name], MD  Clinic Note: Chief Complaint  Patient presents with  . Hospital f/u--CHF    pt c/o DOE--feels like fluid is coming back, has to sleep on 2 pillows; swelling in legs--feel heavy; no other Sx.  . Medication Problem    pt states she has run out of Carvedilol, Hydralazine, and Isosorbide--has not been taking for about 2 days; pt states Lasix is not helping with swelling    HPI: [name] is a 83 y.o. female with a PMH below who presents today for hospital follow-up after admitted for acute congestive heart failure. She has a history of hypertension, stage IV chronic kidney disease as well as a history of ventral hernia with partial bowel obstruction. She started gaining weight about early February and after 3 weeks of worsening edema her PCP started her on low-dose Lasix without any change. She then noted that a week and worsening dyspnea on exertion.  Se had not previously been given a diagnosis of coronary disease. [facility] records reviewed)  Recent Hospitalizations: 06/08/2016--06/11/2016 (RECORDS PERSONALLY REVIEWED)--> noted by echo to have EF of 20-25% with global hypokinesis. Combined systolic and diastolic heart failure. No plans for invasive evaluation due to renal insufficiency. She had worsening acute on chronic kidney disease baseline stage IV. BNP on admission was 997 with minimal troponin elevation. She was treated with IV diuresis. She was seen in consult by Dr. Shelva Majestic (yet she is scheduled see me). Time of evaluation is borderline hypotensive with blood pressures in the 00Q systolic. --> And it was noted her ARB had been stopped by her nephrologist due to renal insufficiency. Recommendations were to consider hydralazine/nitrate for afterload and preload reduction once stabilized. They recommended continuing carvedilol. So iron replacement treatment recommended. --She was not felt to be a good candidate for cardiac catheterization. Mostly because  of her age and renal function. There is questionable benefit from nuclear stress test, but recommended to simply manage him medically at that time. IV Lasix was discontinued after 5 pounds diuresis. She diuresed almost 4 L total and very low dose hydralazine initiated prior to discharge  Studies Reviewed: Personally reviewed, and PMH/PSH updated  2-D Echo 06/01/2016: Mild LVH with EF of 20-25%. Hypokinesis with inferior akinesis. GR 1 DD noted with elevated LVEDP. Moderate LA dilation. Moderate mitral regurgitation. Aortic valve aortic valve calcification/sclerosis without stenosis.  Interval History: [name] presents today as her first follow-up from the hospital and is noted that her swelling has come back and she is still short of breath. Her weight has been roughly 160 pounds on discharge, and she is up to 176 pounds today. She notes that it doesn't seem like her furosemide is working. She's got worsening exertional dyspnea and PND/orthopnea as well as edema. She's now having to use 2 or 3 pillows and is having some neck pain as result. She says that her feet are swollen and sore. She simply reiterates the chief complaints noted above. Thankfully, she is not having any chest tightness or pressure with rest or exertion. She has noted some dizziness and headache on occasion, but no significant symptoms. No palpitations, syncope/near syncope, or TIA/amaurosis fugax symptoms. No melena, hematochezia, hematuria, or epstaxis. No claudication.  ROS: A comprehensive was performed. Review of Systems  Constitutional: Positive for malaise/fatigue.  HENT: Negative for congestion.   Eyes: Negative for blurred vision.  Respiratory: Positive for cough (Has noted worsening cough over the last couple days) and shortness of breath.   Cardiovascular: Positive for  orthopnea, leg swelling and PND.  Gastrointestinal: Negative for blood in stool and melena.  Genitourinary: Negative for hematuria.    Musculoskeletal: Positive for neck pain.  Neurological: Positive for dizziness and weakness. Negative for focal weakness.  Psychiatric/Behavioral: Positive for memory loss. The patient is nervous/anxious.   All other systems reviewed and are negative.    Past Medical History:  Diagnosis Date  . Breast mass   . Cervical spondylosis   . Dilated cardiomyopathy ([place]) 05/2016   Mild LVH with EF of 20 1125%. Hypokinesis with inferior akinesis. GR 1 DD noted with elevated LVEDP. Moderate LA dilation. Moderate mitral regurgitation. Aortic valve aortic valve calcification/sclerosis without stenosis.  . Hypertension     Past Surgical History:  Procedure Laterality Date  . APPENDECTOMY    . BACK SURGERY     x 4  . CATARACT EXTRACTION    . KNEE SURGERY     x 2  . TRANSTHORACIC ECHOCARDIOGRAM  05/2016   Mild LVH with EF of 20 1125%. Hypokinesis with inferior akinesis. GR 1 DD noted with elevated LVEDP. Moderate LA dilation. Moderate mitral regurgitation. Aortic valve aortic valve calcification/sclerosis without stenosis.  Marland Kitchen VENTRAL HERNIA REPAIR N/A 09/14/2013   Procedure: LAPAROSCOPIC ventral HERNIA REPAIR with mesh ;  Surgeon: Shann Medal, MD;  Location: [facility];  Service: General;  Laterality: N/A;    No outpatient prescriptions have been marked as taking for the 07/16/16 encounter (Office Visit) with Leonie Man, MD.    No Known Allergies  Social History   Social History  . Marital status: Widowed    Spouse name: N/A  . Number of children: N/A  . Years of education: N/A   Social History Main Topics  . Smoking status: Former Smoker    Types: Cigarettes  . Smokeless tobacco: Never Used  . Alcohol use No  . Drug use: No  . Sexual activity: Not Asked   Other Topics Concern  . None   Social History Narrative  . None    family history includes Cancer in her father; Diabetes in her mother and sister; Hypertension in her mother.  Wt Readings from Last 3 Encounters:   07/16/16 176 lb (79.8 kg)  06/11/16 160 lb 1.6 oz (72.6 kg)  06/04/14 180 lb (81.6 kg)    PHYSICAL EXAM BP (!) 142/80 (BP Location: Left Arm, Patient Position: Sitting, Cuff Size: Normal)   Pulse 94   Ht 5\' 3"  (1.6 m)   Wt 176 lb (79.8 kg)   BMI 31.18 kg/m  General appearance: alert, cooperative, appears stated age, no distress and mildly obese HEENT: [place]/AT, EOMI, MMM, anicteric sclera Neck: no adenopathy, no carotid bruit and ++ JVD ~10 BM H2O Lungs: clear to auscultation bilaterally, normal percussion bilaterally and non-labored Heart: regular rate and rhythm, S1& S2 normal with blowing HSM ~2/6 @ apex.  Unable to palpate PMI Abdomen: soft, non-tender; bowel sounds normal; no masses,  no organomegaly; mild HJR Extremities: extremities normal, atraumatic, no cyanosis, and edema 3+ to knees bilaterally. Pulses: 2+ and symmetric;  Skin: mobility and turgor normal and no evidence of bleeding or bruising  Neurologic: Mental status: Alert, oriented, thought content appropriate Cranial nerves: normal (II-XII grossly intact)   Adult ECG Report  Rate: 94 ;  Rhythm: normal sinus rhythm and LVH by voltage.  ~ Septal MI, age undetermined.;   Narrative Interpretation: Stable, abnormal EKG   Other studies Reviewed: Additional studies/ records that were reviewed today include:  Recent Labs:  Lab Results  Component Value Date   CREATININE 2.19 (H) 06/11/2016   BUN 43 (H) 06/11/2016   NA 139 06/11/2016   K 3.7 06/11/2016   CL 103 06/11/2016   CO2 24 06/11/2016   No results found for: CHOL, HDL, LDLCALC, LDLDIRECT, TRIG, CHOLHDL  ASSESSMENT / PLAN: Problem List Items Addressed This Visit    CKD (chronic kidney disease) stage 4, GFR 15-29 ml/min (HCC)    As a result of her renal insufficiency, I'm reluctant to consider invasive ischemic evaluation in the absence of chest pain. We'll need to reassess how she is doing over the next several months and consider tensional need for  evaluation.  Currently we are holding ACE inhibitor/ARB until her renal function proves to be stable      Relevant Orders   EKG 12-Lead (Completed)   Basic metabolic panel   Brain natriuretic peptide   Combined systolic and diastolic congestive heart failure, NYHA class 3 (HCC)    Showing signs of exacerbation and going backwards. Discussed increasing Lasix with sliding scale Lasix dosing also increasing hydralazine and Imdur. She will need BMP and BNP checked early next week. In order to closely monitor her, I have asked that she return in roughly 2 weeks to see either Dr. Claiborne Billings or a PA/NP to reassess.      Relevant Medications   isosorbide mononitrate (IMDUR) 30 MG 24 hr tablet   hydrALAZINE (APRESOLINE) 25 MG tablet   furosemide (LASIX) 40 MG tablet   Dilated cardiomyopathy (HCC)    Newly diagnosed dilated cardiomyopathy. In the hospital, the decision was made to withhold further testing as far as an ischemic etiology based on her renal insufficiency and her lack of desire to do any invasive testing. I think we would probably want to reassess her EF after we have been able to optimize her medical management.  Unfortunately, she did appear to have regional akinesis suggestive possibly of occluded RCA which would go along with mitral regurgitation (probably related to ischemic MR). --> Would not be surprised if a Myoview stress test which show high risk findings with likely inferior infarct. Based on her age and renal insufficiency as well as anemia, I think medical management of what is her existing condition is the best option. Would not recommend pursuing full ischemic evaluation unless her symptoms continue to worsen.  She is currently on relatively stable dose of carvedilol 6.25 mg twice a day. Would not try to further titrate until she is more euvolemic. Currently we are increasing her furosemide dosing as noted elsewhere. I'm also increasing hydralazine and Imdur for more  afterload/preload reduction.      Relevant Medications   isosorbide mononitrate (IMDUR) 30 MG 24 hr tablet   hydrALAZINE (APRESOLINE) 25 MG tablet   furosemide (LASIX) 40 MG tablet   Elevated troponin    Probably related to demand ischemia in the setting of her heart failure. Continue aggressive heart failure management with beta blocker and hydralazine/nitrate. May need to consider ischemic evaluation in the future - based on regional wall motion abnormality on echo, however would be reluctant to do an aggressive evaluation without risking exacerbating her renal insufficiency.  We also need to take into consideration her desires given her age and other comorbidities.      Hypertensive heart disease with CHF (congestive heart failure) (HCC) (Chronic)    BP still somewhat elevated given her exacerbation of HF -- increase Imdur & Hydralazine dose for additional BP/afterload (preload) reduction. Imdur --> 30mg   daily Hydralalziine --> 25 mg TID      Relevant Medications   isosorbide mononitrate (IMDUR) 30 MG 24 hr tablet   hydrALAZINE (APRESOLINE) 25 MG tablet   furosemide (LASIX) 40 MG tablet   New onset of congestive heart failure ([place]) - Primary    Relatively new finding from her recent hospitalization of severe cardiomyopathy with combined systolic and diastolic heart failure. Currently she seems to be volume overloaded. Not think it probably has to do with the fact that she was discharged on low-dose furosemide because of worsening renal insufficiency at the end of her hospital stay.  Plan:   Weight yourself daily -  Call office on Monday to let us know what your weigh -- INCREASE LASIX DOSING to 40 mg BID  If weight is not 5 lbs from Friday - You are to take 40 mg twice a day until weight is down.  If weight is 5 lbs less than Friday weight- you will take 40 mg in morning and 20 mg in afternoon @ 3 pm daily   Increase Imdur to 30mg  & Hydralazine to 25 mg tid - for more afterload  reduction -- using instead of ACE-I/ARB b/c CKD      Relevant Medications   isosorbide mononitrate (IMDUR) 30 MG 24 hr tablet   hydrALAZINE (APRESOLINE) 25 MG tablet   furosemide (LASIX) 40 MG tablet      Very complicated cardiac evaluation. Extensive history reviewed involving hospital records and echocardiographic data. Consult notes and discharge summaries. She is also showing signs of heart failure exacerbation almost to the point of her previous hospitalization. Although she had been seen by one of my colleagues, she was new to me Given her comorbidities and age, medical decision-making was high complexity. Also I spent 45 minutes with the patient and family. Greater than 50% of the time was spent discussing her findings, prognosis and potential treatment plans going forward as well as the acute management of her current issue.  Also part of the discussion was the benefit of her following up with Dr. Claiborne Billings who she got to know during hospital stay. Majority of the future treatment options will be decided on by him.  Current medicines are reviewed at length with the patient today. (+/- concerns) n/a The following changes have been made: see below  Patient Instructions  Please have labs done on Monday July 19 2016 [address] BNP BMP  Anytime on Monday.   Weight yourself daily -  Call office on Monday t olet [place] know what your weigh If weight is not 5 lbs from Friday - You are to take 40 mg twice a day until weight is down. If weight is 5 lbs less than Friday weight- you will take 40 mg in morning and 20 mg in afternoon @ 3 pm daily    MEDICATIONS CHANGES  -imdur (isosorbide mononitrate) 30 mg once a day  -hydralazine 25 mg one tablet twice a day  -lasix ( furosemide ) 40 mg  In the morning and 20 mg in the afternoon  (depends on your weight)     Your physician recommends that you schedule a follow-up appointment in 2 weeks with dr Claiborne Billings or  extender. Patient will need follow up wth Dr Claiborne Billings in the future     Studies Ordered:   Orders Placed This Encounter  Procedures  . Basic metabolic panel  . Brain natriuretic peptide  . EKG 12-Lead      Shanon Brow  Ellyn Hack, M.D., M.S. Interventional Cardiologist   Pager # [phone] Phone # [phone] [address] [place], [place] [address]

## 2016-07-19 ENCOUNTER — Encounter: Payer: Self-pay | Admitting: Cardiology

## 2016-07-19 ENCOUNTER — Telehealth: Payer: Self-pay | Admitting: Physician Assistant

## 2016-07-19 ENCOUNTER — Other Ambulatory Visit: Payer: Self-pay | Admitting: Cardiology

## 2016-07-19 DIAGNOSIS — I5040 Unspecified combined systolic (congestive) and diastolic (congestive) heart failure: Secondary | ICD-10-CM | POA: Insufficient documentation

## 2016-07-19 DIAGNOSIS — I420 Dilated cardiomyopathy: Secondary | ICD-10-CM | POA: Insufficient documentation

## 2016-07-19 MED ORDER — CARVEDILOL 6.25 MG PO TABS: 6.2500 mg | ORAL_TABLET | Freq: Two times a day (BID) | ORAL | 6 refills | Status: DC

## 2016-07-19 NOTE — Assessment & Plan Note (Addendum)
Newly diagnosed dilated cardiomyopathy. In the hospital, the decision was made to withhold further testing as far as an ischemic etiology based on her renal insufficiency and her lack of desire to do any invasive testing. I think we would probably want to reassess her EF after we have been able to optimize her medical management.  Unfortunately, she did appear to have regional akinesis suggestive possibly of occluded RCA which would go along with mitral regurgitation (probably related to ischemic MR). --> Would not be surprised if a Myoview stress test which show high risk findings with likely inferior infarct. Based on her age and renal insufficiency as well as anemia, I think medical management of what is her existing condition is the best option. Would not recommend pursuing full ischemic evaluation unless her symptoms continue to worsen.  She is currently on relatively stable dose of carvedilol 6.25 mg twice a day. Would not try to further titrate until she is more euvolemic. Currently we are increasing her furosemide dosing as noted elsewhere. I'm also increasing hydralazine and Imdur for more afterload/preload reduction.

## 2016-07-19 NOTE — Assessment & Plan Note (Addendum)
As a result of her renal insufficiency, I'm reluctant to consider invasive ischemic evaluation in the absence of chest pain. We'll need to reassess how she is doing over the next several months and consider tensional need for evaluation.  Currently we are holding ACE inhibitor/ARB until her renal function proves to be stable

## 2016-07-19 NOTE — Telephone Encounter (Signed)
See changes per last OV note. Pt seen by Dr. Ellyn Hack Friday. To f/u w Almyra Deforest on 4/20.

## 2016-07-19 NOTE — Assessment & Plan Note (Signed)
BP still somewhat elevated given her exacerbation of HF -- increase Imdur & Hydralazine dose for additional BP/afterload (preload) reduction. Imdur --> 30mg  daily Hydralalziine --> 25 mg TID

## 2016-07-19 NOTE — Assessment & Plan Note (Addendum)
Probably related to demand ischemia in the setting of her heart failure. Continue aggressive heart failure management with beta blocker and hydralazine/nitrate. May need to consider ischemic evaluation in the future - based on regional wall motion abnormality on echo, however would be reluctant to do an aggressive evaluation without risking exacerbating her renal insufficiency.  We also need to take into consideration her desires given her age and other comorbidities.

## 2016-07-19 NOTE — Telephone Encounter (Signed)
I am quite happy that her weight has gone down. Labs have not yet returned.  She should be following up with the PA/NP or Dr. Claiborne Billings in a few weeks.  [place]

## 2016-07-19 NOTE — Telephone Encounter (Signed)
Follow up   Pt is returning call    

## 2016-07-19 NOTE — Telephone Encounter (Signed)
Left msg to call back if needed

## 2016-07-19 NOTE — Telephone Encounter (Signed)
Pt scheduled to see Isaac Laud on 4/20

## 2016-07-19 NOTE — Telephone Encounter (Signed)
Reviewed w patient. She's lost ~9-10 lbs as reported over weekend, so I've advised based on Dr. Allison Quarry notes that she may take the lower dose of lasix (40mg  in AM, 20mg  in PM). She's aware to resume higher dose (40mg  BID) if she is >169 lbs.  She has pending labwork - BMET and BNP - drawn today. Informed her these should result in next day and Dr. Ellyn Hack will make further recommendations at that time.  She voiced understanding and thanks for call.

## 2016-07-19 NOTE — Assessment & Plan Note (Signed)
Relatively new finding from her recent hospitalization of severe cardiomyopathy with combined systolic and diastolic heart failure. Currently she seems to be volume overloaded. Not think it probably has to do with the fact that she was discharged on low-dose furosemide because of worsening renal insufficiency at the end of her hospital stay.  Plan:   Weight yourself daily -  Call office on Monday to let us know what your weigh -- INCREASE LASIX DOSING to 40 mg BID  If weight is not 5 lbs from Friday - You are to take 40 mg twice a day until weight is down.  If weight is 5 lbs less than Friday weight- you will take 40 mg in morning and 20 mg in afternoon @ 3 pm daily   Increase Imdur to 30mg  & Hydralazine to 25 mg tid - for more afterload reduction -- using instead of ACE-I/ARB b/c CKD

## 2016-07-19 NOTE — Assessment & Plan Note (Signed)
Showing signs of exacerbation and going backwards. Discussed increasing Lasix with sliding scale Lasix dosing also increasing hydralazine and Imdur. She will need BMP and BNP checked early next week. In order to closely monitor her, I have asked that she return in roughly 2 weeks to see either Dr. Claiborne Billings or a PA/NP to reassess.

## 2016-07-19 NOTE — Telephone Encounter (Signed)
New Message     Wt is 164.0  Previously 174.4 over the weekend. She was to call and report this today

## 2016-07-20 LAB — BASIC METABOLIC PANEL
BUN / CREAT RATIO: 21 (ref 12–28)
BUN: 50 mg/dL — AB (ref 10–36)
CO2: 24 mmol/L (ref 18–29)
CREATININE: 2.33 mg/dL — AB (ref 0.57–1.00)
Calcium: 8.4 mg/dL — ABNORMAL LOW (ref 8.7–10.3)
Chloride: 96 mmol/L (ref 96–106)
GFR calc Af Amer: 20 mL/min/{1.73_m2} — ABNORMAL LOW (ref >59)
GFR, EST NON AFRICAN AMERICAN: 18 mL/min/{1.73_m2} — AB (ref >59)
GLUCOSE: 100 mg/dL — AB (ref 65–99)
Potassium: 4.4 mmol/L (ref 3.5–5.2)
Sodium: 141 mmol/L (ref 134–144)

## 2016-07-20 LAB — BRAIN NATRIURETIC PEPTIDE: BNP: 2611.6 pg/mL — AB (ref 0.0–100.0)

## 2016-07-22 NOTE — Telephone Encounter (Signed)
Pt seems real weak after taking her fluid medicine.Her daughter wants to know if Dr Ellyn Hack could call in something to take for her Potassium.

## 2016-07-22 NOTE — Telephone Encounter (Signed)
Left msg for pt to return call.

## 2016-07-26 ENCOUNTER — Telehealth: Payer: Self-pay | Admitting: Cardiology

## 2016-07-26 NOTE — Telephone Encounter (Signed)
SPOKE DAUGHTER MOVED APPOINTMENT TOMORROW 4/17 18 WITH [facility] PA-CANCELLING 4/20 APPOINTMENT WITH MENG PA  DAUGHTER AND PATIENT  AWARE. VERBALIZED UNDERSTANDING.   PRIMARY CARDIOLOGIST DR Claiborne Billings

## 2016-07-26 NOTE — Telephone Encounter (Signed)
PATIENT APPOINTMENT MOVED TO 4./17/18

## 2016-07-26 NOTE — Telephone Encounter (Signed)
New Message    Pt is very lathargic and having a lot of coughing, pt daughter is concerned with her being on the water pills

## 2016-07-27 ENCOUNTER — Encounter: Payer: Self-pay | Admitting: Cardiology

## 2016-07-27 ENCOUNTER — Ambulatory Visit (INDEPENDENT_AMBULATORY_CARE_PROVIDER_SITE_OTHER): Payer: Medicare Other | Admitting: Cardiology

## 2016-07-27 VITALS — BP 152/90 | HR 90 | Ht 63.0 in | Wt 161.6 lb

## 2016-07-27 DIAGNOSIS — I5023 Acute on chronic systolic (congestive) heart failure: Secondary | ICD-10-CM | POA: Diagnosis not present

## 2016-07-27 DIAGNOSIS — N184 Chronic kidney disease, stage 4 (severe): Secondary | ICD-10-CM | POA: Diagnosis not present

## 2016-07-27 DIAGNOSIS — I420 Dilated cardiomyopathy: Secondary | ICD-10-CM | POA: Diagnosis not present

## 2016-07-27 DIAGNOSIS — J90 Pleural effusion, not elsewhere classified: Secondary | ICD-10-CM

## 2016-07-27 MED ORDER — METOLAZONE 2.5 MG PO TABS: 2.5000 mg | ORAL_TABLET | ORAL | 3 refills | Status: DC

## 2016-07-27 NOTE — Assessment & Plan Note (Signed)
EF 20-25% ?

## 2016-07-27 NOTE — Assessment & Plan Note (Signed)
Lt pleural effusion on exam

## 2016-07-27 NOTE — Assessment & Plan Note (Signed)
Last SCr 2.33- K+ 4.4 - 07/19/16

## 2016-07-27 NOTE — Progress Notes (Signed)
07/27/2016 [name]   1923/05/19  [id number]  Primary Physician [name], MD Primary Cardiologist: Dr Claiborne Billings  HPI:  83 y/o AA female seen by Dr Claiborne Billings in consult 06/10/16 for new onset CHF and new LVD. Her EF was 20-25% by echo. The pt has HTN, and stage 4 CRI. She was not felt to be a candidate for further ischemic work up. She was diuresed down to 160 lbs and discharged 06/11/16. Dr Ellyn Hack saw her in follow up 07/19/16. Her wgt was 176 lbs. She ws SOB and had LE edema. Labs showed her BNP to > 2K. Dr Ellyn Hack increased her medications and she is here to see me today in follow up. Despite loosing several pounds the pt says she still has orthopnea and coughing when flat. She still has LE edema and dyspnea. Her wgt today is 161 lbs.    Current Outpatient Prescriptions  Medication Sig Dispense Refill  . acetaminophen (TYLENOL) 325 MG tablet Take 650 mg by mouth every 6 (six) hours as needed for mild pain.    Marland Kitchen allopurinol (ZYLOPRIM) 100 MG tablet Take 100 mg by mouth daily.     Marland Kitchen aspirin EC 81 MG tablet Take 81 mg by mouth every morning.    . carvedilol (COREG) 6.25 MG tablet Take 1 tablet (6.25 mg total) by mouth 2 (two) times daily with a meal. 60 tablet 6  . ferrous sulfate 325 (65 FE) MG tablet Take 1 tablet (325 mg total) by mouth 3 (three) times daily with meals. 90 tablet 0  . furosemide (LASIX) 40 MG tablet Take 40 mg in the morning and 20 mg in afternoon, may take extra 20 mg dose in the afternoon as needed 60 tablet 6  . hydrALAZINE (APRESOLINE) 25 MG tablet Take 1 tablet (25 mg total) by mouth 2 (two) times daily. 60 tablet 6  . isosorbide mononitrate (IMDUR) 30 MG 24 hr tablet Take 1 tablet (30 mg total) by mouth daily. 30 tablet 6  . Multiple Vitamins-Minerals (PRESERVISION AREDS PO) Take 1 capsule by mouth daily.     . NONFORMULARY OR COMPOUNDED [place] compound:  Peripheral Neuropathy cream - Bupivacaine 1%, Doxepin 3%, Gabapentin 6%, Pentoxifylline 3%,  Topiramate 1%, dispense 120gram apply 102 grams to affected area 3-4 times daily, +3refills. 120 each 3  . [START ON 07/29/2016] metolazone (ZAROXOLYN) 2.5 MG tablet Take 1 tablet (2.5 mg total) by mouth 2 (two) times a week. 30 tablet 3   No current facility-administered medications for this visit.     No Known Allergies  Past Medical History:  Diagnosis Date  . Breast mass   . Cervical spondylosis   . Dilated cardiomyopathy ([place]) 05/2016   Mild LVH with EF of 20 1125%. Hypokinesis with inferior akinesis. GR 1 DD noted with elevated LVEDP. Moderate LA dilation. Moderate mitral regurgitation. Aortic valve aortic valve calcification/sclerosis without stenosis.  . Hypertension     Social History   Social History  . Marital status: Widowed    Spouse name: N/A  . Number of children: N/A  . Years of education: N/A   Occupational History  . Not on file.   Social History Main Topics  . Smoking status: Former Smoker    Types: Cigarettes  . Smokeless tobacco: Never Used  . Alcohol use No  . Drug use: No  . Sexual activity: Not on file   Other Topics Concern  . Not on file   Social History Narrative  . No  narrative on file     Family History  Problem Relation Age of Onset  . Diabetes Mother   . Hypertension Mother   . Cancer Father     uknown kind of cancer  . Diabetes Sister      Review of Systems: General: negative for chills, fever, night sweats or weight changes.  Cardiovascular: negative for chest pain, dyspnea on exertion, edema, orthopnea, palpitations, paroxysmal nocturnal dyspnea or shortness of breath Dermatological: negative for rash Respiratory: negative for cough or wheezing Urologic: negative for hematuria Abdominal: negative for nausea, vomiting, diarrhea, bright red blood per rectum, melena, or hematemesis Neurologic: negative for visual changes, syncope, or dizziness All other systems reviewed and are otherwise negative except as noted  above.    Blood pressure (!) 152/90, pulse 90, height 5\' 3"  (1.6 m), weight 161 lb 9.6 oz (73.3 kg).  General appearance: alert, cooperative and no distress Neck: JVD noted Lungs: Lt pleural effusion on exam Heart: regular rate and rhythm Pulses: 2+ and symmetric Neurologic: Grossly normal  Extrem: 1+ bilateral ankle edema   ASSESSMENT AND PLAN:   Acute on chronic systolic (congestive) heart failure (HCC) Despite los of 15 lbs pt still has orthopnea, cough, dyspnea,  and LE edema  CKD (chronic kidney disease) stage 4, GFR 15-29 ml/min (HCC) Last SCr 2.33- K+ 4.4 - 07/19/16  Bilateral pleural effusion Lt pleural effusion on exam  Dilated cardiomyopathy ([place]) EF 20-25%   PLAN  I suspect Ms Faulconer was probably inadequately diuresed on her initial admission in March (in the hospital only 24 hrs). I have added Zaroxolyn 2.5 mg x 2 days- then 2.5 mg Monday and Thursday. She'll return for follow up in 10 days or so with a BMP and BNP then.   Kerin Ransom PA-C 07/27/2016 10:03 AM

## 2016-07-27 NOTE — Assessment & Plan Note (Signed)
Despite los of 15 lbs pt still has orthopnea, cough, dyspnea,  and LE edema

## 2016-07-27 NOTE — Patient Instructions (Addendum)
Medication Instructions:   START metolazone (Zaroxolyn). For starting dose, take 2 consecutive days (today, tomorrow). After that, take 1 tablet daily on Mondays and Thursdays (TWICE WEEKLY) only. Take this medication 30-60 mins before morning dose of furosemide (LASIX)  Labwork: none   Testing/Procedures: none   Follow-Up: April 30th 8am with Kerin Ransom PA    If you need a refill on your cardiac medications before your next appointment, please call your pharmacy.

## 2016-07-30 ENCOUNTER — Ambulatory Visit: Payer: Medicare Other | Admitting: Physician Assistant

## 2016-08-07 ENCOUNTER — Emergency Department (HOSPITAL_COMMUNITY): Payer: Medicare Other

## 2016-08-07 ENCOUNTER — Inpatient Hospital Stay (HOSPITAL_COMMUNITY)
Admission: EM | Admit: 2016-08-07 | Discharge: 2016-08-09 | DRG: 291 | Disposition: A | Discharge status: Home Health | Payer: Medicare Other | Attending: Internal Medicine | Admitting: Internal Medicine

## 2016-08-07 ENCOUNTER — Encounter (HOSPITAL_COMMUNITY): Payer: Self-pay | Admitting: Emergency Medicine

## 2016-08-07 DIAGNOSIS — Z7189 Other specified counseling: Secondary | ICD-10-CM | POA: Diagnosis not present

## 2016-08-07 DIAGNOSIS — Z87891 Personal history of nicotine dependence: Secondary | ICD-10-CM | POA: Diagnosis not present

## 2016-08-07 DIAGNOSIS — Z66 Do not resuscitate: Secondary | ICD-10-CM | POA: Diagnosis not present

## 2016-08-07 DIAGNOSIS — Z515 Encounter for palliative care: Secondary | ICD-10-CM | POA: Diagnosis not present

## 2016-08-07 DIAGNOSIS — R627 Adult failure to thrive: Secondary | ICD-10-CM | POA: Diagnosis present

## 2016-08-07 DIAGNOSIS — I420 Dilated cardiomyopathy: Secondary | ICD-10-CM | POA: Diagnosis present

## 2016-08-07 DIAGNOSIS — R630 Anorexia: Secondary | ICD-10-CM | POA: Diagnosis present

## 2016-08-07 DIAGNOSIS — N179 Acute kidney failure, unspecified: Secondary | ICD-10-CM | POA: Diagnosis present

## 2016-08-07 DIAGNOSIS — I081 Rheumatic disorders of both mitral and tricuspid valves: Secondary | ICD-10-CM | POA: Diagnosis present

## 2016-08-07 DIAGNOSIS — I5023 Acute on chronic systolic (congestive) heart failure: Secondary | ICD-10-CM | POA: Diagnosis not present

## 2016-08-07 DIAGNOSIS — D631 Anemia in chronic kidney disease: Secondary | ICD-10-CM | POA: Diagnosis present

## 2016-08-07 DIAGNOSIS — Z7982 Long term (current) use of aspirin: Secondary | ICD-10-CM | POA: Diagnosis not present

## 2016-08-07 DIAGNOSIS — I5043 Acute on chronic combined systolic (congestive) and diastolic (congestive) heart failure: Secondary | ICD-10-CM | POA: Diagnosis present

## 2016-08-07 DIAGNOSIS — N184 Chronic kidney disease, stage 4 (severe): Secondary | ICD-10-CM | POA: Diagnosis present

## 2016-08-07 DIAGNOSIS — Z8249 Family history of ischemic heart disease and other diseases of the circulatory system: Secondary | ICD-10-CM

## 2016-08-07 DIAGNOSIS — I130 Hypertensive heart and chronic kidney disease with heart failure and stage 1 through stage 4 chronic kidney disease, or unspecified chronic kidney disease: Secondary | ICD-10-CM | POA: Diagnosis present

## 2016-08-07 DIAGNOSIS — I509 Heart failure, unspecified: Secondary | ICD-10-CM

## 2016-08-07 LAB — CBC
HCT: 30 % — ABNORMAL LOW (ref 36.0–46.0)
HEMOGLOBIN: 10 g/dL — AB (ref 12.0–15.0)
MCH: 27 pg (ref 26.0–34.0)
MCHC: 33.3 g/dL (ref 30.0–36.0)
MCV: 81.1 fL (ref 78.0–100.0)
Platelets: 267 10*3/uL (ref 150–400)
RBC: 3.7 MIL/uL — AB (ref 3.87–5.11)
RDW: 15 % (ref 11.5–15.5)
WBC: 11.2 10*3/uL — AB (ref 4.0–10.5)

## 2016-08-07 LAB — URINALYSIS, ROUTINE W REFLEX MICROSCOPIC
Bilirubin Urine: NEGATIVE
GLUCOSE, UA: NEGATIVE mg/dL
Hgb urine dipstick: NEGATIVE
Ketones, ur: NEGATIVE mg/dL
LEUKOCYTES UA: NEGATIVE
Nitrite: NEGATIVE
PH: 5 (ref 5.0–8.0)
Protein, ur: NEGATIVE mg/dL
SPECIFIC GRAVITY, URINE: 1.008 (ref 1.005–1.030)

## 2016-08-07 LAB — BASIC METABOLIC PANEL
ANION GAP: 15 (ref 5–15)
BUN: 62 mg/dL — ABNORMAL HIGH (ref 6–20)
CALCIUM: 8.5 mg/dL — AB (ref 8.9–10.3)
CO2: 25 mmol/L (ref 22–32)
Chloride: 95 mmol/L — ABNORMAL LOW (ref 101–111)
Creatinine, Ser: 2.6 mg/dL — ABNORMAL HIGH (ref 0.44–1.00)
GFR, EST AFRICAN AMERICAN: 17 mL/min — AB (ref >60)
GFR, EST NON AFRICAN AMERICAN: 15 mL/min — AB (ref >60)
GLUCOSE: 141 mg/dL — AB (ref 65–99)
Potassium: 3.6 mmol/L (ref 3.5–5.1)
SODIUM: 135 mmol/L (ref 135–145)

## 2016-08-07 LAB — I-STAT TROPONIN, ED: TROPONIN I, POC: 0.02 ng/mL (ref 0.00–0.08)

## 2016-08-07 LAB — BRAIN NATRIURETIC PEPTIDE: B NATRIURETIC PEPTIDE 5: 815.2 pg/mL — AB (ref 0.0–100.0)

## 2016-08-07 LAB — MAGNESIUM: Magnesium: 2.6 mg/dL — ABNORMAL HIGH (ref 1.7–2.4)

## 2016-08-07 MED ORDER — LEVALBUTEROL HCL 1.25 MG/0.5ML IN NEBU: 1.2500 mg | INHALATION_SOLUTION | Freq: Four times a day (QID) | RESPIRATORY_TRACT | Status: DC | PRN

## 2016-08-07 MED ORDER — SODIUM CHLORIDE 0.9% FLUSH
3.0000 mL | Freq: Two times a day (BID) | INTRAVENOUS | Status: DC
  Administered 2016-08-07 – 2016-08-08 (×3): 3 mL via INTRAVENOUS

## 2016-08-07 MED ORDER — ONDANSETRON HCL 4 MG/2ML IJ SOLN: 4.0000 mg | Freq: Four times a day (QID) | INTRAMUSCULAR | Status: DC | PRN

## 2016-08-07 MED ORDER — ENOXAPARIN SODIUM 30 MG/0.3ML ~~LOC~~ SOLN
30.0000 mg | SUBCUTANEOUS | Status: DC
  Administered 2016-08-07 – 2016-08-08 (×2): 30 mg via SUBCUTANEOUS
  Filled 2016-08-07 (×3): qty 0.3

## 2016-08-07 MED ORDER — FUROSEMIDE 10 MG/ML IJ SOLN
40.0000 mg | Freq: Once | INTRAMUSCULAR | Status: AC
  Administered 2016-08-07: 40 mg via INTRAVENOUS
  Filled 2016-08-07: qty 4

## 2016-08-07 MED ORDER — FUROSEMIDE 10 MG/ML IJ SOLN: 80.0000 mg | Freq: Once | INTRAMUSCULAR | Status: DC

## 2016-08-07 MED ORDER — FERROUS SULFATE 325 (65 FE) MG PO TABS
325.0000 mg | ORAL_TABLET | Freq: Three times a day (TID) | ORAL | Status: DC
  Administered 2016-08-07 – 2016-08-09 (×6): 325 mg via ORAL
  Filled 2016-08-07 (×6): qty 1

## 2016-08-07 MED ORDER — ACETAMINOPHEN 325 MG PO TABS: 650.0000 mg | ORAL_TABLET | ORAL | Status: DC | PRN

## 2016-08-07 MED ORDER — ASPIRIN EC 81 MG PO TBEC
81.0000 mg | DELAYED_RELEASE_TABLET | Freq: Every morning | ORAL | Status: DC
  Administered 2016-08-08 – 2016-08-09 (×2): 81 mg via ORAL
  Filled 2016-08-07 (×3): qty 1

## 2016-08-07 MED ORDER — ENSURE ENLIVE PO LIQD
237.0000 mL | Freq: Two times a day (BID) | ORAL | Status: DC
  Administered 2016-08-07 – 2016-08-09 (×4): 237 mL via ORAL

## 2016-08-07 MED ORDER — SODIUM CHLORIDE 0.9% FLUSH: 3.0000 mL | INTRAVENOUS | Status: DC | PRN

## 2016-08-07 MED ORDER — SODIUM CHLORIDE 0.9 % IV SOLN: 250.0000 mL | INTRAVENOUS | Status: DC | PRN

## 2016-08-07 NOTE — ED Provider Notes (Signed)
[facility] DEPT Provider Note   CSN: [id number] Arrival date & time: 08/07/16  1057     History   Chief Complaint Chief Complaint  Patient presents with  . Shortness of Breath  . Cough    HPI [name] is a 83 y.o. female.  HPI  83 year old female with history of congestive heart failure with last EF 20-25% in March. She was hospitalized at that time and was diuresed down to approximately 160 pounds. She had some improvement at the time of discharge but has had increased shortness of breath over the past 2 weeks. She was seen by her primary care physician on Wednesday and was told that she may have bronchitis. She was started on Augmentin. She was told at that time she was forced to come to the emergency department as she may need a chest x-Umaima Scholten. She endorses coughing with some clear sputum. Symptoms are worse when lying down. She has increased dyspnea on exertion. She lives alone. Her daughter lives approximately 5 minutes away and is in the house out of the house frequently. Daughter reports that she has had decreased appetite. She denies headache, chest pain, nausea, vomiting, or diarrhea. She has had lower extremity swelling that completely resolved last month with the diuresis but has had some return. She denies any lateralized leg swelling, history of DVT, or PE.  Past Medical History:  Diagnosis Date  . Breast mass   . Cervical spondylosis   . Dilated cardiomyopathy ([place]) 05/2016   Mild LVH with EF of 20 1125%. Hypokinesis with inferior akinesis. GR 1 DD noted with elevated LVEDP. Moderate LA dilation. Moderate mitral regurgitation. Aortic valve aortic valve calcification/sclerosis without stenosis.  . Hypertension     Patient Active Problem List   Diagnosis Date Noted  . Acute on chronic systolic (congestive) heart failure ([place]) 07/27/2016  . Dilated cardiomyopathy ([place]) 07/19/2016  . Combined systolic and diastolic congestive heart failure, NYHA class 3 ([place])  07/19/2016  . New onset of congestive heart failure ([place]) 06/08/2016  . Anemia 06/08/2016  . Bilateral pleural effusion 06/08/2016  . Elevated troponin 06/08/2016  . Ventral hernia 09/15/2013  . CKD (chronic kidney disease) stage 4, GFR 15-29 ml/min (HCC) 09/15/2013  . Acute on chronic renal failure ([place]) 09/14/2013  . Hypertensive heart disease with CHF (congestive heart failure) ([place]) 09/14/2013  . Partial bowel obstruction ([place]) 09/13/2013    Past Surgical History:  Procedure Laterality Date  . APPENDECTOMY    . BACK SURGERY     x 4  . CATARACT EXTRACTION    . KNEE SURGERY     x 2  . TRANSTHORACIC ECHOCARDIOGRAM  05/2016   Mild LVH with EF of 20 1125%. Hypokinesis with inferior akinesis. GR 1 DD noted with elevated LVEDP. Moderate LA dilation. Moderate mitral regurgitation. Aortic valve aortic valve calcification/sclerosis without stenosis.  Marland Kitchen VENTRAL HERNIA REPAIR N/A 09/14/2013   Procedure: LAPAROSCOPIC ventral HERNIA REPAIR with mesh ;  Surgeon: Shann Medal, MD;  Location: [facility] ORS;  Service: General;  Laterality: N/A;    OB History    No data available       Home Medications    Prior to Admission medications   Medication Sig Start Date End Date Taking? Authorizing Provider  acetaminophen (TYLENOL) 325 MG tablet Take 650 mg by mouth every 6 (six) hours as needed for mild pain.    Historical Provider, MD  allopurinol (ZYLOPRIM) 100 MG tablet Take 100 mg by mouth daily.  09/26/13  Historical Provider, MD  aspirin EC 81 MG tablet Take 81 mg by mouth every morning.    Historical Provider, MD  carvedilol (COREG) 6.25 MG tablet Take 1 tablet (6.25 mg total) by mouth 2 (two) times daily with a meal. 07/19/16   Leonie Man, MD  ferrous sulfate 325 (65 FE) MG tablet Take 1 tablet (325 mg total) by mouth 3 (three) times daily with meals. 06/11/16   Lavina Hamman, MD  furosemide (LASIX) 40 MG tablet Take 40 mg in the morning and 20 mg in afternoon, may take extra 20 mg dose in the  afternoon as needed 07/16/16   Leonie Man, MD  hydrALAZINE (APRESOLINE) 25 MG tablet Take 1 tablet (25 mg total) by mouth 2 (two) times daily. 07/16/16 09/14/16  Leonie Man, MD  isosorbide mononitrate (IMDUR) 30 MG 24 hr tablet Take 1 tablet (30 mg total) by mouth daily. 07/16/16   Leonie Man, MD  metolazone (ZAROXOLYN) 2.5 MG tablet Take 1 tablet (2.5 mg total) by mouth 2 (two) times a week. 07/29/16 10/27/16  Erlene Quan, PA-C  Multiple Vitamins-Minerals (PRESERVISION AREDS PO) Take 1 capsule by mouth daily.     Historical Provider, MD  NONFORMULARY OR COMPOUNDED [place] compound:  Peripheral Neuropathy cream - Bupivacaine 1%, Doxepin 3%, Gabapentin 6%, Pentoxifylline 3%, Topiramate 1%, dispense 120gram apply 102 grams to affected area 3-4 times daily, +3refills. 07/01/15   Max Villa Herb, DPM    Family History Family History  Problem Relation Age of Onset  . Diabetes Mother   . Hypertension Mother   . Cancer Father     uknown kind of cancer  . Diabetes Sister     Social History Social History  Substance Use Topics  . Smoking status: Former Smoker    Types: Cigarettes  . Smokeless tobacco: Never Used  . Alcohol use No     Allergies   Patient has no known allergies.   Review of Systems Review of Systems  All other systems reviewed and are negative.    Physical Exam Updated Vital Signs BP 123/71   Pulse 81   Temp 98.3 F (36.8 C) (Oral)   Resp 18   SpO2 97%   Physical Exam  Constitutional: She is oriented to person, place, and time. She appears well-developed and well-nourished.  HENT:  Head: Normocephalic and atraumatic.  Right Ear: External ear normal.  Left Ear: External ear normal.  Eyes: EOM are normal. Pupils are equal, round, and reactive to light.  Neck: Normal range of motion. Neck supple.  Cardiovascular: Normal rate, regular rhythm, normal heart sounds and intact distal pulses.   Pulmonary/Chest: Breath sounds normal.  Some  increased work of breathing  Abdominal: Soft. Bowel sounds are normal.  Musculoskeletal: Normal range of motion.  Trace edema  Neurological: She is alert and oriented to person, place, and time.  Skin: Skin is warm and dry.  Psychiatric: She has a normal mood and affect. Her behavior is normal.  Nursing note and vitals reviewed.    ED Treatments / Results  Labs (all labs ordered are listed, but only abnormal results are displayed) Labs Reviewed  BASIC METABOLIC PANEL - Abnormal; Notable for the following:       Result Value   Chloride 95 (*)    Glucose, Bld 141 (*)    BUN 62 (*)    Creatinine, Ser 2.60 (*)    Calcium 8.5 (*)    GFR calc non Af  Amer 15 (*)    GFR calc Af Amer 17 (*)    All other components within normal limits  CBC - Abnormal; Notable for the following:    WBC 11.2 (*)    RBC 3.70 (*)    Hemoglobin 10.0 (*)    HCT 30.0 (*)    All other components within normal limits  BRAIN NATRIURETIC PEPTIDE  I-STAT TROPOININ, ED    EKG  EKG Interpretation  Date/Time:  Saturday August 07 2016 11:01:36 EDT Ventricular Rate:  92 PR Interval:  134 QRS Duration: 94 QT Interval:  382 QTC Calculation: 472 R Axis:   34 Text Interpretation:  Normal sinus rhythm Low voltage QRS Cannot rule out Anterior infarct , age undetermined ST & T wave abnormality, consider inferior ischemia Abnormal ECG Confirmed by Katriona Schmierer MD, Andee Poles [phone]) on 08/07/2016 12:00:53 PM       Radiology Dg Chest 2 View  Result Date: 08/07/2016 CLINICAL DATA:  Shortness of breath, persistent cough for several weeks EXAM: CHEST  2 VIEW COMPARISON:  06/09/2016 FINDINGS: There are bilateral small pleural effusions, left greater than right. There is bilateral mild interstitial thickening. There is no focal consolidation. There is no pneumothorax. There is mild stable cardiomegaly. The osseous structures are unremarkable. IMPRESSION: Findings concerning for mild CHF. Electronically Signed   By: Kathreen Devoid    On: 08/07/2016 12:19    Procedures Procedures (including critical care time)  Medications Ordered in ED Medications - No data to display   Initial Impression / Assessment and Plan / ED Course  I have reviewed the triage vital signs and the nursing notes.  Pertinent labs & imaging results that were available during my care of the patient were reviewed by me and considered in my medical decision making (see chart for details).   83 year old female with EF 20% and worsening congestive heart failure. She has been diuresed down to 160 pounds and is maintaining this weight. However, she continues have worsened symptoms and has bilateral pulmonary effusions. Today her EKG shows some nonspecific ST changes that appear somewhat new from prior. Plan cardiology consult to maximize medications and therapy    Final Clinical Impressions(s) / ED Diagnoses   Final diagnoses:  Acute exacerbation of CHF (congestive heart failure) ([place])    New Prescriptions New Prescriptions   No medications on file     Pattricia Boss, MD 08/09/16 [phone]

## 2016-08-07 NOTE — ED Triage Notes (Signed)
Pt/dsughter stated, SOB with cough . Pt went to see her doctor on wed and was given an Inhaler, and medicine for her cough and shes no better.

## 2016-08-07 NOTE — ED Notes (Signed)
Lunch tray ordered 

## 2016-08-07 NOTE — Progress Notes (Signed)
Admission database completed with pt and daughter at bedside. Orthostatic vital signs could not be completed, pt felt short of breath and needed to sit down. Will try again later. Pt now resting in bed.  [name]

## 2016-08-07 NOTE — ED Notes (Signed)
Pt transported to and from radiology on stretcher with tech, tolerated well.  

## 2016-08-07 NOTE — ED Notes (Signed)
Walked patient to the bathroom pt back in bed resting

## 2016-08-07 NOTE — ED Notes (Signed)
Got patient undress on the monitor waiting for provider

## 2016-08-07 NOTE — Progress Notes (Signed)
Patient became dyspneic after walking to the bathroom, slow to recover. SPO2 was 98% on room air, but placed on 2Lpm for comfort, patient reports relief after a few minutes of oxygen. Patient takes levalbuterol inhaler at home, but nothing ordered here for respiratory. Paged Dr. Myna Hidalgo requesting breathing treatments be ordered.

## 2016-08-07 NOTE — H&P (Signed)
History and Physical    [name] NAT:[id number] DOB: [birth year] DOA: 08/07/2016   PCP: [name], MD   Patient coming from/Resides with: Private residence/lives alone  Admission status: Inpatient/telemetry -medically necessary to stay a minimum 2 midnights to rule out impending and/or unexpected changes in physiologic status that may differ from initial evaluation performed in the ER and/or at time of admission. Presents with shortness of breath and cough for at least 3 days with laboratory findings consistent with acute kidney injury on stage IV chronic kidney disease. Chest x-ray consistent with mild CHF although BNP is not elevated for this patient. Blood pressure suboptimal. Inpatient cardiology consultation pending. Patient will require frequent monitoring of hemodynamic status, intake/output, frequent labs and likely readjustment of cardiac medications prior to discharge. She may require inpatient PT/OT evaluation for possibility of short-term rehabilitation therapies prior to returning to private home environment.  Chief Complaint: Shortness of breath with cough.  HPI: [name] is a 83 y.o. female with medical history significant for chronic systolic heart failure NYHA 3, stage IV chronic kidney disease, hypertension, and anemia. Patient was recently discharged in March 4270 with systolic heart failure with a estimated dry weight of 160 pounds. Prior to this patient had been quite active participate in require driving herself back and forth to the grocery store and other errands. Since diagnosis she has had gradual decline in functional status. She has not had any falls but over one year ago she fell out in the yard -according to her daughter "doing something she shouldn't have been doing". After discharge in March, patient followed up with cardiology on 4/17 and her weight was up 16 lbs so twice-weekly dosing of Zaroxolyn was added. She was seen by her primary care physician on  Wednesday for upper respiratory symptoms presumed to be bronchitic in nature and was started on inhaler and cough medications without improvement in symptoms. Of note her weight here in the ER is 157 lbs. In discussing with the patient how she has been doing since her diagnosis she reports that she felt that she has been getting weaker since diagnosis especially after she takes her medications.   ED Course:  Vital Signs: BP (!) 106/54   Pulse 78   Temp 98.3 F (36.8 C) (Oral)   Resp (!) 24   Wt 71.6 kg (157 lb 13.6 oz)   SpO2 98%   BMI 27.96 kg/m  2 view CXR: Mild CHF Lab data: Sodium 135, potassium 4.6, chloride 95, CO2 25, glucose 141, BUN 62, creatinine 2.6, calcium 8.5, anion gap 15, BNP 815, POC troponin 0.02, white count 11,200 with hemoglobin 10, platelets 267,000 Medications and treatments: Lasix 40 mg IV 1  Review of Systems:  In addition to the HPI above,  No Fever-chills, myalgias or other constitutional symptoms No Headache, changes with Vision or hearing, new weakness, tingling, numbness in any extremity, dizziness, dysarthria or word finding difficulty, gait disturbance or imbalance, tremors or seizure activity No problems swallowing food or Liquids, indigestion/reflux, choking or coughing while eating, abdominal pain with or after eating No Chest pain, palpitations, orthopnea  No Abdominal pain, N/V, melena,hematochezia, dark tarry stools, constipation No dysuria, malodorous urine, hematuria or flank pain No new skin rashes, lesions, masses or bruises, No new joint pains, aches, swelling or redness No recent unintentional weight gain  No polyuria, polydypsia or polyphagia   Past Medical History:  Diagnosis Date  . Breast mass   . Cervical spondylosis   . Dilated cardiomyopathy (  [place]) 05/2016   Mild LVH with EF of 20 1125%. Hypokinesis with inferior akinesis. GR 1 DD noted with elevated LVEDP. Moderate LA dilation. Moderate mitral regurgitation. Aortic valve aortic  valve calcification/sclerosis without stenosis.  . Hypertension     Past Surgical History:  Procedure Laterality Date  . APPENDECTOMY    . BACK SURGERY     x 4  . CATARACT EXTRACTION    . KNEE SURGERY     x 2  . TRANSTHORACIC ECHOCARDIOGRAM  05/2016   Mild LVH with EF of 20 1125%. Hypokinesis with inferior akinesis. GR 1 DD noted with elevated LVEDP. Moderate LA dilation. Moderate mitral regurgitation. Aortic valve aortic valve calcification/sclerosis without stenosis.  [facility] Kitchen VENTRAL HERNIA REPAIR N/A 09/14/2013   Procedure: LAPAROSCOPIC ventral HERNIA REPAIR with mesh ;  Surgeon: Shann Medal, MD;  Location: [facility];  Service: General;  Laterality: N/A;    Social History   Social History  . Marital status: Widowed    Spouse name: N/A  . Number of children: N/A  . Years of education: N/A   Occupational History  . Not on file.   Social History Main Topics  . Smoking status: Former Smoker    Types: Cigarettes  . Smokeless tobacco: Never Used  . Alcohol use No  . Drug use: No  . Sexual activity: Not on file   Other Topics Concern  . Not on file   Social History Narrative  . No narrative on file    Mobility: Cane or rolling walker Work history: Not obtained   No Known Allergies  Family History  Problem Relation Age of Onset  . Diabetes Mother   . Hypertension Mother   . Cancer Father     uknown kind of cancer  . Diabetes Sister      Prior to Admission medications   Medication Sig Start Date End Date Taking? Authorizing Provider  acetaminophen (TYLENOL) 325 MG tablet Take 650 mg by mouth every 6 (six) hours as needed for mild pain.   Yes Historical Provider, MD  allopurinol (ZYLOPRIM) 100 MG tablet Take 100 mg by mouth daily.  09/26/13  Yes Historical Provider, MD  amoxicillin-clavulanate (AUGMENTIN) 500-125 MG tablet Take 1 tablet by mouth 2 (two) times daily.   Yes Historical Provider, MD  aspirin EC 81 MG tablet Take 81 mg by mouth every morning.   Yes  Historical Provider, MD  carvedilol (COREG) 6.25 MG tablet Take 1 tablet (6.25 mg total) by mouth 2 (two) times daily with a meal. 07/19/16  Yes Leonie Man, MD  ferrous sulfate 325 (65 FE) MG tablet Take 1 tablet (325 mg total) by mouth 3 (three) times daily with meals. 06/11/16  Yes Lavina Hamman, MD  furosemide (LASIX) 40 MG tablet Take 40 mg in the morning and 20 mg in afternoon, may take extra 20 mg dose in the afternoon as needed 07/16/16  Yes Leonie Man, MD  hydrALAZINE (APRESOLINE) 25 MG tablet Take 1 tablet (25 mg total) by mouth 2 (two) times daily. 07/16/16 09/14/16 Yes Leonie Man, MD  isosorbide mononitrate (IMDUR) 30 MG 24 hr tablet Take 1 tablet (30 mg total) by mouth daily. 07/16/16  Yes Leonie Man, MD  levalbuterol [facility] HFA) 45 MCG/ACT inhaler Inhale 1 puff into the lungs every 4 (four) hours as needed for wheezing.   Yes Historical Provider, MD  metolazone (ZAROXOLYN) 2.5 MG tablet Take 1 tablet (2.5 mg total) by mouth 2 (two) times a  week. 07/29/16 10/27/16 Yes Erlene Quan, PA-C  Multiple Vitamins-Minerals (PRESERVISION AREDS PO) Take 1 capsule by mouth daily.    Yes Historical Provider, MD  NONFORMULARY OR COMPOUNDED [place] compound:  Peripheral Neuropathy cream - Bupivacaine 1%, Doxepin 3%, Gabapentin 6%, Pentoxifylline 3%, Topiramate 1%, dispense 120gram apply 102 grams to affected area 3-4 times daily, +3refills. 07/01/15  Yes Max T Hyatt, DPM  Probiotic Product (ALIGN PO) Take 1 capsule by mouth daily.   Yes Historical Provider, MD    Physical Exam: Vitals:   08/07/16 1241 08/07/16 1338 08/07/16 1354 08/07/16 1436  BP: 123/71 (!) 106/54  (!) 150/89  Pulse: 81 78  88  Resp: 18 (!) 24  20  Temp:      TempSrc:      SpO2: 97% 98%  97%  Weight:   71.6 kg (157 lb 13.6 oz)       Constitutional: NAD, calm, comfortable Eyes: PERRL, lids and conjunctivae normal ENMT: Mucous membranes are DRY. Posterior pharynx clear of any exudate or lesions. Fair  dentition.  Neck: normal, supple, no masses, no thyromegaly Respiratory: clear to auscultation bilaterally, no wheezing, no crackles. Normal respiratory effort. No accessory muscle use. Mildly tachypneic at rest Cardiovascular: Regular rate and rhythm, no murmurs / rubs / gallops. No extremity edema. 2+ pedal pulses. No carotid bruits.  Abdomen: no tenderness, no masses palpated. No hepatosplenomegaly. Bowel sounds positive.  Musculoskeletal: no clubbing / cyanosis. No joint deformity upper and lower extremities. Good ROM, no contractures. Normal muscle tone.  Skin: no rashes, lesions, ulcers. No induration Neurologic: CN 2-12 grossly intact. Sensation intact, DTR normal. Strength 5/5 x all 4 extremities.  Psychiatric: Normal judgment and insight. Alert and oriented x 3. Normal mood.    Labs on Admission: I have personally reviewed following labs and imaging studies  CBC:  Recent Labs Lab 08/07/16 1137  WBC 11.2*  HGB 10.0*  HCT 30.0*  MCV 81.1  PLT 203   Basic Metabolic Panel:  Recent Labs Lab 08/07/16 1137  NA 135  K 3.6  CL 95*  CO2 25  GLUCOSE 141*  BUN 62*  CREATININE 2.60*  CALCIUM 8.5*   GFR: Estimated Creatinine Clearance: 13.4 mL/min (A) (by C-G formula based on SCr of 2.6 mg/dL (H)). Liver Function Tests: No results for input(s): AST, ALT, ALKPHOS, BILITOT, PROT, ALBUMIN in the last 168 hours. No results for input(s): LIPASE, AMYLASE in the last 168 hours. No results for input(s): AMMONIA in the last 168 hours. Coagulation Profile: No results for input(s): INR, PROTIME in the last 168 hours. Cardiac Enzymes: No results for input(s): CKTOTAL, CKMB, CKMBINDEX, TROPONINI in the last 168 hours. BNP (last 3 results) No results for input(s): PROBNP in the last 8760 hours. HbA1C: No results for input(s): HGBA1C in the last 72 hours. CBG: No results for input(s): GLUCAP in the last 168 hours. Lipid Profile: No results for input(s): CHOL, HDL, LDLCALC, TRIG,  CHOLHDL, LDLDIRECT in the last 72 hours. Thyroid Function Tests: No results for input(s): TSH, T4TOTAL, FREET4, T3FREE, THYROIDAB in the last 72 hours. Anemia Panel: No results for input(s): VITAMINB12, FOLATE, FERRITIN, TIBC, IRON, RETICCTPCT in the last 72 hours. Urine analysis:    Component Value Date/Time   COLORURINE YELLOW 09/13/2013 1633   APPEARANCEUR CLEAR 09/13/2013 1633   LABSPEC 1.022 09/13/2013 1633   PHURINE 5.5 09/13/2013 [place] 09/13/2013 1633   HGBUR NEGATIVE 09/13/2013 1633   BILIRUBINUR NEGATIVE 09/13/2013 1633   KETONESUR NEGATIVE  09/13/2013 1633   PROTEINUR NEGATIVE 09/13/2013 1633   UROBILINOGEN 0.2 09/13/2013 1633   NITRITE NEGATIVE 09/13/2013 1633   LEUKOCYTESUR SMALL (A) 09/13/2013 1633   Sepsis Labs: @LABRCNTIP (procalcitonin:4,lacticidven:4) )No results found for this or any previous visit (from the past 240 hour(s)).   Radiological Exams on Admission: Dg Chest 2 View  Result Date: 08/07/2016 CLINICAL DATA:  Shortness of breath, persistent cough for several weeks EXAM: CHEST  2 VIEW COMPARISON:  06/09/2016 FINDINGS: There are bilateral small pleural effusions, left greater than right. There is bilateral mild interstitial thickening. There is no focal consolidation. There is no pneumothorax. There is mild stable cardiomegaly. The osseous structures are unremarkable. IMPRESSION: Findings concerning for mild CHF. Electronically Signed   By: Kathreen Devoid   On: 08/07/2016 12:19    EKG: (Independently reviewed) Sinus rhythm with ventricular rate 92 bpm, QTC 472 ms, incomplete right bundle branch block, delayed R-wave rotation, no acute ST segment changes, stable T-wave inversion lateral leads  Assessment/Plan Principal Problem:   Acute kidney injury on CKD (chronic kidney disease) stage 4, GFR 15-29 ml/min  -Patient presents with shortness of breath and cough and was found to have acute kidney injury with abnormal chest x-ray -Of note EDW  reported as 160 lbs with current weight 157 lbs -Twice weekly Zaroxolyn added on 4/17 -Baseline renal function: (Prior to initiation of diuretics)  37 and 1.84 -Renal function at discharge: 43 and 2.19 -Current renal function: 62 and 2.6 -Hold diuretics, Zaroxolyn, and antihypertensive medications-currently with suboptimal blood pressures which could be contributing to acute kidney injury especially w/ low output chronic heart failure -Orthostatic vital signs -(+/-) Gentle IV fluids -Follow labs  Active Problems:   Acute on 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, NYHA class 3  -Newly diagnosed March 2018 -Echocardiogram March 2018: EF 20-25% with mild LVH, global hypokinesis with inferior akinesis, grade 1 diastolic dysfunction, mild tricuspid regurgitation with normal pulmonary artery pressure -BNP during previous exacerbation > 2000 -Current BNP 815 -EDW 160 lbs -Current weight 157 lbs -Exam, weight, clinical data does not support acute heart failure exacerbation at this juncture -Holding carvedilol, Imdur and hydralazine due to suboptimal blood pressures -Daily weights and strict I/O -No ACE inhibitor secondary to CKD -Await cardiology formal recommendations    Hypertensive heart disease with CHF (congestive heart failure)  -Blood pressures have been labile since arrival-initial blood pressure was 96/53 -May be reflective of volume depletion state -Holding antihypertensive medications and diuretics (she did receive IV Lasix in the ER)    FTT (failure to thrive) in adult -Patient reports issues with generalized weakness and decline in function since diagnosis -Possibly related to overmedication/lower blood pressure readings 2/2 cardiac medications and diuretics -Prior to discharge recommend checking room air ambulatory pulse oximetry to determine if patient has exertional hypoxemia that would require home oxygen -PT/OT evaluation      DVT  prophylaxis: Lovenox dose adjusted for low GFR Code Status: Full Family Communication: Daughter at bedside Disposition Plan: Home Consults called: Cardiology/[facility] on call    Yoltzin Barg L. ANP-BC [facility] Pager [phone]   If 7PM-7AM, please contact night-coverage [email or web address] Password [facility]  08/07/2016, 2:41 PM

## 2016-08-08 ENCOUNTER — Inpatient Hospital Stay (HOSPITAL_COMMUNITY): Payer: Medicare Other

## 2016-08-08 DIAGNOSIS — N184 Chronic kidney disease, stage 4 (severe): Secondary | ICD-10-CM

## 2016-08-08 DIAGNOSIS — Z515 Encounter for palliative care: Secondary | ICD-10-CM

## 2016-08-08 DIAGNOSIS — N179 Acute kidney failure, unspecified: Secondary | ICD-10-CM

## 2016-08-08 DIAGNOSIS — I5023 Acute on chronic systolic (congestive) heart failure: Secondary | ICD-10-CM

## 2016-08-08 LAB — URINE CULTURE

## 2016-08-08 LAB — BASIC METABOLIC PANEL
ANION GAP: 11 (ref 5–15)
BUN: 63 mg/dL — ABNORMAL HIGH (ref 6–20)
CALCIUM: 8.3 mg/dL — AB (ref 8.9–10.3)
CO2: 28 mmol/L (ref 22–32)
CREATININE: 2.44 mg/dL — AB (ref 0.44–1.00)
Chloride: 98 mmol/L — ABNORMAL LOW (ref 101–111)
GFR, EST AFRICAN AMERICAN: 19 mL/min — AB (ref >60)
GFR, EST NON AFRICAN AMERICAN: 16 mL/min — AB (ref >60)
GLUCOSE: 95 mg/dL (ref 65–99)
Potassium: 3 mmol/L — ABNORMAL LOW (ref 3.5–5.1)
Sodium: 137 mmol/L (ref 135–145)

## 2016-08-08 MED ORDER — FUROSEMIDE 10 MG/ML IJ SOLN
40.0000 mg | Freq: Two times a day (BID) | INTRAMUSCULAR | Status: DC
  Administered 2016-08-08 – 2016-08-09 (×3): 40 mg via INTRAVENOUS
  Filled 2016-08-08 (×5): qty 4

## 2016-08-08 MED ORDER — POTASSIUM CHLORIDE CRYS ER 20 MEQ PO TBCR
40.0000 meq | EXTENDED_RELEASE_TABLET | Freq: Four times a day (QID) | ORAL | Status: AC
  Administered 2016-08-08 (×2): 40 meq via ORAL
  Filled 2016-08-08 (×2): qty 2

## 2016-08-08 MED ORDER — ALLOPURINOL 100 MG PO TABS
100.0000 mg | ORAL_TABLET | Freq: Every day | ORAL | Status: DC
  Administered 2016-08-08 – 2016-08-09 (×2): 100 mg via ORAL
  Filled 2016-08-08 (×3): qty 1

## 2016-08-08 MED ORDER — METOLAZONE 2.5 MG PO TABS
2.5000 mg | ORAL_TABLET | Freq: Once | ORAL | Status: AC
  Administered 2016-08-08: 2.5 mg via ORAL
  Filled 2016-08-08: qty 1

## 2016-08-08 MED ORDER — CARVEDILOL 3.125 MG PO TABS
3.1250 mg | ORAL_TABLET | Freq: Two times a day (BID) | ORAL | Status: DC
  Administered 2016-08-08 – 2016-08-09 (×2): 3.125 mg via ORAL
  Filled 2016-08-08 (×2): qty 1

## 2016-08-08 NOTE — Consult Note (Signed)
Consultation Note Date: 08/08/2016   Patient Name: [name]  DOB: 1924/02/05  MRN: [id number]  Age / Sex: 83 y.o., female  PCP: [name], MD Referring Physician: Thurnell Lose, MD  Reason for Consultation: Establishing goals of care and Psychosocial/spiritual support  HPI/Patient Profile: 83 y.o. female  with past medical history of New diagnosis of systolic heart failure (diagnosed March 2018 with an EF of 20-25%), stage IV kidney disease, dilated cardiomyopathy admitted on 08/07/2016 with shortness of breath. She was admitted with diagnosis of CHF, BNP was 8:15. Her creatinine is 2.6 on admission. Until 2 months ago patient has been living alone and taking care of all of her IADLs as well as ADLs, driving..   Clinical Assessment and Goals of Care: Patient shares with me this new diagnosis is been very difficult as it feels to her  it happened so suddenly. Patient has been a very independent woman, living alone, caring for her own needs, driving as well as active in her church. She did follow up with heart failure provider but states she has seen 2 different providers since her discharge from the hospital in February and is concerned about her ongoing outpatient follow-up. Her goal is to medically maximize her care, continue to be as functional and independent as possible. She is willing to live with her daughter who has offered her this option, rehabilitation in the home, a skilled nursing facility for short-term rehabilitation to likely return to her daughter's house. Discussed disease progression related to congestive heart failure and, chronic kidney disease; CODE STATUS, hemodialysis.  Patient at this point is decisional and has an advanced directive. She has 2 living children, a daughter and son who both act as her healthcare proxy.    SUMMARY OF RECOMMENDATIONS   DO NOT RESUSCITATE DO NOT  INTUBATE. She states "when the [facility] is ready for me I am ready". No hemodialysis Still deciding between skilled nursing facility for rehabilitation, or rehabilitation in the home (her daughter's home) Patient would benefit from ongoing palliative care, goals of care discussions in the home or at the facility. After utilizing skilled days either in skilled facility or at home, patient would be eligible for her hospice benefit to assist patient and family in the home setting based on her chronic kidney disease as well as systolic heart failure with an EF of 20-25%.  Community palliative care services can be accessed through [facility] or [facility]  Code Status/Advance Care Planning:  DNR    Symptom Management:   Dyspnea: Continue with targeted pulmonary treatments, diuretics as tolerated in the setting of worsening creatinine kidney function, oxygen. Patient would benefit from low-dose opioids at some point in the near future to manage acute dyspnea. That was not addressed today as patient is still coming to terms with this new diagnosis of heart failure with associated kidney disease  Palliative Prophylaxis:   Aspiration, Eye Care, Frequent Pain Assessment, Oral Care and Turn Reposition  Additional Recommendations (Limitations,  Scope, Preferences):  Avoid Hospitalization, Initiate Comfort Feeding, No Artificial Feeding, No Chemotherapy, No Hemodialysis, No Surgical Procedures and No Tracheostomy  Psycho-social/Spiritual:   Desire for further Chaplaincy support:no  Additional Recommendations: Referral to Community Resources   Prognosis:   < 6 months in the setting of systolic heart failure with an ejection fraction of 20-25%, stage IV kidney disease with a creatinine of 2.44  Discharge Planning: To Be Determined      Primary Diagnoses: Present on Admission: . Acute on chronic systolic heart failure, NYHA class 3 ([place]) . CKD (chronic  kidney disease) stage 4, GFR 15-29 ml/min ([facility]) . Hypertensive heart disease with CHF (congestive heart failure) ([place]) . Acute kidney injury ([place]) . FTT (failure to thrive) in adult   I have reviewed the medical record, interviewed the patient and family, and examined the patient. The following aspects are pertinent.  Past Medical History:  Diagnosis Date  . Breast mass   . Cervical spondylosis   . Dilated cardiomyopathy ([place]) 05/2016   Mild LVH with EF of 20 1125%. Hypokinesis with inferior akinesis. GR 1 DD noted with elevated LVEDP. Moderate LA dilation. Moderate mitral regurgitation. Aortic valve aortic valve calcification/sclerosis without stenosis.  . Hypertension    Social History   Social History  . Marital status: Widowed    Spouse name: N/A  . Number of children: N/A  . Years of education: N/A   Social History Main Topics  . Smoking status: Former Smoker    Types: Cigarettes  . Smokeless tobacco: Never Used  . Alcohol use No  . Drug use: No  . Sexual activity: Not Asked   Other Topics Concern  . None   Social History Narrative  . None   Family History  Problem Relation Age of Onset  . Diabetes Mother   . Hypertension Mother   . Cancer Father     uknown kind of cancer  . Diabetes Sister    Scheduled Meds: . aspirin EC  81 mg Oral q morning - 10a  . carvedilol  3.125 mg Oral BID WC  . enoxaparin (LOVENOX) injection  30 mg Subcutaneous Q24H  . feeding supplement (ENSURE ENLIVE)  237 mL Oral BID BM  . ferrous sulfate  325 mg Oral TID WC  . furosemide  40 mg Intravenous BID  . potassium chloride  40 mEq Oral Q6H   Continuous Infusions: PRN Meds:.acetaminophen, levalbuterol, ondansetron (ZOFRAN) IV, sodium chloride flush Medications Prior to Admission:  Prior to Admission medications   Medication Sig Start Date End Date Taking? Authorizing Provider  acetaminophen (TYLENOL) 325 MG tablet Take 650 mg by mouth every 6 (six) hours as needed for mild pain.    Yes Historical Provider, MD  allopurinol (ZYLOPRIM) 100 MG tablet Take 100 mg by mouth daily.  09/26/13  Yes Historical Provider, MD  amoxicillin-clavulanate (AUGMENTIN) 500-125 MG tablet Take 1 tablet by mouth 2 (two) times daily.   Yes Historical Provider, MD  aspirin EC 81 MG tablet Take 81 mg by mouth every morning.   Yes Historical Provider, MD  carvedilol (COREG) 6.25 MG tablet Take 1 tablet (6.25 mg total) by mouth 2 (two) times daily with a meal. 07/19/16  Yes Leonie Man, MD  ferrous sulfate 325 (65 FE) MG tablet Take 1 tablet (325 mg total) by mouth 3 (three) times daily with meals. 06/11/16  Yes Lavina Hamman, MD  furosemide (LASIX) 40 MG tablet Take 40 mg in the morning and 20 mg in afternoon, may  take extra 20 mg dose in the afternoon as needed 07/16/16  Yes Leonie Man, MD  hydrALAZINE (APRESOLINE) 25 MG tablet Take 1 tablet (25 mg total) by mouth 2 (two) times daily. 07/16/16 09/14/16 Yes Leonie Man, MD  isosorbide mononitrate (IMDUR) 30 MG 24 hr tablet Take 1 tablet (30 mg total) by mouth daily. 07/16/16  Yes Leonie Man, MD  levalbuterol [facility] HFA) 45 MCG/ACT inhaler Inhale 1 puff into the lungs every 4 (four) hours as needed for wheezing.   Yes Historical Provider, MD  metolazone (ZAROXOLYN) 2.5 MG tablet Take 1 tablet (2.5 mg total) by mouth 2 (two) times a week. 07/29/16 10/27/16 Yes Luke K Kilroy, PA-C  Multiple Vitamins-Minerals (PRESERVISION AREDS PO) Take 1 capsule by mouth daily.    Yes Historical Provider, MD  NONFORMULARY OR COMPOUNDED [place] compound:  Peripheral Neuropathy cream - Bupivacaine 1%, Doxepin 3%, Gabapentin 6%, Pentoxifylline 3%, Topiramate 1%, dispense 120gram apply 102 grams to affected area 3-4 times daily, +3refills. 07/01/15  Yes Max T Hyatt, DPM  Probiotic Product (ALIGN PO) Take 1 capsule by mouth daily.   Yes Historical Provider, MD   No Known Allergies Review of Systems  Constitutional: Positive for activity change, fatigue and  unexpected weight change.  HENT: Negative.   Eyes: Negative.   Respiratory: Positive for cough and shortness of breath.   Cardiovascular: Positive for leg swelling.  Gastrointestinal: Negative.   Endocrine: Negative.   Genitourinary: Negative.   Musculoskeletal: Negative.   Skin: Negative.   Allergic/Immunologic: Negative.   Neurological: Positive for weakness.  Hematological: Negative.   Psychiatric/Behavioral: Negative.     Physical Exam  Constitutional: She is oriented to person, place, and time. She appears well-developed and well-nourished.  Eyes: EOM are normal. Pupils are equal, round, and reactive to light.  Neck: Normal range of motion.  Cardiovascular: Normal rate.   Pulmonary/Chest:  Short of breath with minimal exertion  Musculoskeletal: Normal range of motion.  Neurological: She is alert and oriented to person, place, and time.  Skin: Skin is warm and dry.  Psychiatric: She has a normal mood and affect. Her behavior is normal. Judgment and thought content normal.  Nursing note and vitals reviewed.   Vital Signs: BP 113/66 (BP Location: Right Arm)   Pulse 80   Temp 98 F (36.7 C) (Oral)   Resp 18   Ht 5\' 3"  (1.6 m)   Wt 71 kg (156 lb 9.6 oz) Comment: scale a  SpO2 98%   BMI 27.74 kg/m  Pain Assessment: No/denies pain   Pain Score: 0-No pain   SpO2: SpO2: 98 % O2 Device:SpO2: 98 % O2 Flow Rate: .O2 Flow Rate (L/min): 2 L/min  IO: Intake/output summary:  Intake/Output Summary (Last 24 hours) at 08/08/16 1114 Last data filed at 08/08/16 0912  Gross per 24 hour  Intake              620 ml  Output             1000 ml  Net             -380 ml    LBM: Last BM Date: 08/06/16 Baseline Weight: Weight: 71.6 kg (157 lb 13.6 oz) Most recent weight: Weight: 71 kg (156 lb 9.6 oz) (scale a)     Palliative Assessment/Data:   Flowsheet Rows     Most Recent Value  Intake Tab  Referral Department  Hospitalist  Unit at Time of Referral  Cardiac/Telemetry  Unit  Palliative Care Primary Diagnosis  Cardiac  Date Notified  08/07/16  Palliative Care Type  New Palliative care  Reason for referral  Clarify Goals of Care, Psychosocial or Spiritual support  Date of Admission  08/07/16  Date first seen by Palliative Care  08/08/16  # of days Palliative referral response time  1 Day(s)  # of days IP prior to Palliative referral  0  Clinical Assessment  Palliative Performance Scale Score  40%  Pain Max last 24 hours  0  Pain Min Last 24 hours  0  Dyspnea Max Last 24 Hours  6  Dyspnea Min Last 24 hours  3  Nausea Max Last 24 Hours  0  Nausea Min Last 24 Hours  0  Anxiety Max Last 24 Hours  0  Anxiety Min Last 24 Hours  0  Psychosocial & Spiritual Assessment  Palliative Care Outcomes  Patient/Family meeting held?  Yes  Who was at the meeting?  pt  Palliative Care Outcomes  Clarified goals of care, Changed CPR status, Provided psychosocial or spiritual support, Completed durable DNR  Patient/Family wishes: Interventions discontinued/not started   Mechanical Ventilation, Hemodialysis, Trach  Palliative Care follow-up planned  No      Time In: 1020 Time Out: 1125 Time Total: 65 min Greater than 50%  of this time was spent counseling and coordinating care related to the above assessment and plan. Staffed with Dr. Candiss Norse  Signed by: Dory Horn, NP   Please contact Palliative Medicine Team phone at [phone] for questions and concerns.  For individual provider: See Shea Evans

## 2016-08-08 NOTE — Progress Notes (Signed)
PROGRESS NOTE                                                                                                                                                                                                             Patient Demographics:    [name], is a 83 y.o. female, DOB - 02-16-1924, HAL:[id number]  Admit date - 08/07/2016   Admitting Physician Elwin Mocha, MD  Outpatient Primary MD for the patient is [name], MD  LOS - 1  Chief Complaint  Patient presents with  . Shortness of Breath  . Cough       Brief Narrative  [name] is a 83 y.o. female with medical history significant for chronic systolic heart failure NYHA 3 EF 20%, stage IV chronic kidney disease, hypertension, and anemia. Patient was recently discharged in March 7353 with systolic heart failure was admitted back for shortness of breath caused by CHF.   Subjective:    [name] today has, No headache, No chest pain, No abdominal pain - No Nausea, No new weakness tingling or numbness, No Cough - Improved SOB.     Assessment  & Plan :     1.Acute on chronic combined systolic and end-diastolic heart failure EF 20% on recent echocardiogram done few months ago - improved with IV Lasix which will be continued, blood pressure is low hence we will add low-dose Coreg only at this time, continue supportive care with oxygen and nebulizer treatments, renal function precludes addition of ACE/ARB. If blood pressure improves will add hydralazine and Imdur. Continue salt and fluid restriction. Due to her advanced age and severe systolic dysfunction will involve palliative care as well to address goals of care. On her recent hospitalization she was seen by cardiology and was considered only a medical management candidate.  2. CK D stage IV. Mild ARF due to CHF decompensation. With diuresis creatinine improving and close to her baseline of  2.4.  3. Hypertension. Blood pressure currently soft continue diuresis with Lasix along with low-dose Coreg as tolerated.  4. Anemia of chronic disease. Stable no acute issues.    Diet : Diet Heart Room service appropriate? Yes; Fluid consistency: Thin; Fluid restriction: 1200 mL Fluid    Family Communication  : None  Code Status :  Full  Disposition Plan  :  Home  1-2 days  Consults  :  Pall Care  Procedures  :    DVT Prophylaxis  :  Lovenox    Lab Results  Component Value Date   PLT 267 08/07/2016    Inpatient Medications  Scheduled Meds: . aspirin EC  81 mg Oral q morning - 10a  . enoxaparin (LOVENOX) injection  30 mg Subcutaneous Q24H  . feeding supplement (ENSURE ENLIVE)  237 mL Oral BID BM  . ferrous sulfate  325 mg Oral TID WC  . furosemide  40 mg Intravenous BID  . potassium chloride  40 mEq Oral Q6H  . sodium chloride flush  3 mL Intravenous Q12H   Continuous Infusions: . sodium chloride     PRN Meds:.sodium chloride, acetaminophen, levalbuterol, ondansetron (ZOFRAN) IV, sodium chloride flush  Antibiotics  :    Anti-infectives    None         Objective:   Vitals:   08/07/16 2006 08/07/16 2109 08/08/16 0011 08/08/16 0521  BP:  (!) 124/59 (!) 121/58 113/66  Pulse:  90 89 80  Resp:  18 18 18   Temp:  98.7 F (37.1 C) 98 F (36.7 C) 98 F (36.7 C)  TempSrc:  Oral Oral Oral  SpO2: 99% 97% 98% 98%  Weight:    71 kg (156 lb 9.6 oz)  Height:        Wt Readings from Last 3 Encounters:  08/08/16 71 kg (156 lb 9.6 oz)  07/27/16 73.3 kg (161 lb 9.6 oz)  07/16/16 79.8 kg (176 lb)     Intake/Output Summary (Last 24 hours) at 08/08/16 1013 Last data filed at 08/08/16 0912  Gross per 24 hour  Intake              620 ml  Output             1000 ml  Net             -380 ml     Physical Exam  Awake Alert, Oriented X 3, No new F.N deficits, Normal affect [place].AT,PERRAL Supple Neck,No JVD, No cervical lymphadenopathy appriciated.  Symmetrical  Chest wall movement, Good air movement bilaterally, few rales RRR,No Gallops,Rubs or new Murmurs, No Parasternal Heave +ve B.Sounds, Abd Soft, No tenderness, No organomegaly appriciated, No rebound - guarding or rigidity. No Cyanosis, Clubbing or edema, No new Rash or bruise       Data Review:    CBC  Recent Labs Lab 08/07/16 1137  WBC 11.2*  HGB 10.0*  HCT 30.0*  PLT 267  MCV 81.1  MCH 27.0  MCHC 33.3  RDW 15.0    Chemistries   Recent Labs Lab 08/07/16 1137 08/07/16 1544 08/08/16 0301  NA 135  --  137  K 3.6  --  3.0*  CL 95*  --  98*  CO2 25  --  28  GLUCOSE 141*  --  95  BUN 62*  --  63*  CREATININE 2.60*  --  2.44*  CALCIUM 8.5*  --  8.3*  MG  --  2.6*  --    ------------------------------------------------------------------------------------------------------------------ No results for input(s): CHOL, HDL, LDLCALC, TRIG, CHOLHDL, LDLDIRECT in the last 72 hours.  No results found for: HGBA1C ------------------------------------------------------------------------------------------------------------------ No results for input(s): TSH, T4TOTAL, T3FREE, THYROIDAB in the last 72 hours.  Invalid input(s): FREET3 ------------------------------------------------------------------------------------------------------------------ No results for input(s): VITAMINB12, FOLATE, FERRITIN, TIBC, IRON, RETICCTPCT in the last 72 hours.  Coagulation profile No results for input(s): INR, PROTIME in the last  168 hours.  No results for input(s): DDIMER in the last 72 hours.  Cardiac Enzymes No results for input(s): CKMB, TROPONINI, MYOGLOBIN in the last 168 hours.  Invalid input(s): CK ------------------------------------------------------------------------------------------------------------------    Component Value Date/Time   BNP 815.2 (H) 08/07/2016 1137    Micro Results No results found for this or any previous visit (from the past 240 hour(s)).  Radiology  Reports Dg Chest 2 View  Result Date: 08/08/2016 CLINICAL DATA:  Shortness of breath and cough for 2 weeks. Hypertension. EXAM: CHEST  2 VIEW COMPARISON:  August 07, 2016 FINDINGS: There are bilateral pleural effusions with bibasilar atelectasis. There is no frank edema or consolidation. There is cardiomegaly with pulmonary vascularity within normal limits. There is aortic atherosclerosis. No adenopathy. There is degenerative change in each shoulder and in the thoracic spine. There is postoperative change in the cervical spine. IMPRESSION: Cardiomegaly with persistent pleural effusions. Suspect a degree of congestive heart failure. Bibasilar atelectasis. No frank edema or consolidation. Aortic atherosclerosis. Electronically Signed   By: Lowella Grip III M.D.   On: 08/08/2016 08:20   Dg Chest 2 View  Result Date: 08/07/2016 CLINICAL DATA:  Shortness of breath, persistent cough for several weeks EXAM: CHEST  2 VIEW COMPARISON:  06/09/2016 FINDINGS: There are bilateral small pleural effusions, left greater than right. There is bilateral mild interstitial thickening. There is no focal consolidation. There is no pneumothorax. There is mild stable cardiomegaly. The osseous structures are unremarkable. IMPRESSION: Findings concerning for mild CHF. Electronically Signed   By: Kathreen Devoid   On: 08/07/2016 12:19    Time Spent in minutes  30   Lala Lund M.D on 08/08/2016 at 10:13 AM  Between 7am to 7pm - Pager - [phone] ( page via [email or web address], text pages only, please mention full 10 digit call back number). After 7pm go to [email or web address] - password [email or web address]

## 2016-08-08 NOTE — Evaluation (Signed)
Physical Therapy Evaluation Patient Details Name: [name] MRN: [id number] DOB: 01-May-1923 Today's Date: 08/08/2016   History of Present Illness  Pt admitted on 08/07/16 with worsening dyspnea. Pt diagnosed with CHF exacerbation. PMh significant for CKD, HTN.   Clinical Impression  Pt presents with the above diagnosis and below deficits for therapy evaluation. Prior to admission, pt was living alone in a single level home and receiving occasional assistance from her daughter. Pt was able to perform all adls and was driving. Pt is able to perform at a supervision to min guard assistance this session with dyspnea noted following. O2 sats on RA 90% and quickly increased to 98% with 2L O2 in place. Pt will benefit from SNF for short term rehab in order to maximize her functional outcomes for a safe return home.     Follow Up Recommendations SNF    Equipment Recommendations  None recommended by PT    Recommendations for Other Services       Precautions / Restrictions Precautions Precautions: Fall Restrictions Weight Bearing Restrictions: No      Mobility  Bed Mobility Overal bed mobility: Modified Independent             General bed mobility comments: increased time with HOB elevated, no hands on assist from clinician.   Transfers Overall transfer level: Needs assistance Equipment used: Rolling walker (2 wheeled) Transfers: Sit to/from Stand Sit to Stand: Min guard         General transfer comment: Min guard for safety from EOB  Ambulation/Gait Ambulation/Gait assistance: Min guard;Supervision Ambulation Distance (Feet): 20 Feet Assistive device: Rolling walker (2 wheeled) Gait Pattern/deviations: Step-through pattern;Decreased step length - right;Decreased step length - left Gait velocity: decreased Gait velocity interpretation: Below normal speed for age/gender General Gait Details: decreased step length bilaterally, cues for staying within RW during gait.    Stairs            Wheelchair Mobility    Modified Rankin (Stroke Patients Only)       Balance Overall balance assessment: Needs assistance Sitting-balance support: No upper extremity supported;Feet supported Sitting balance-Leahy Scale: Good     Standing balance support: No upper extremity supported Standing balance-Leahy Scale: Fair                               Pertinent Vitals/Pain Pain Assessment: 0-10 Pain Score: 8  Pain Location: bilateral LE's in great toe Pain Descriptors / Indicators: Aching Pain Intervention(s): Monitored during session;Repositioned    Home Living Family/patient expects to be discharged to:: Private residence Living Arrangements: Alone Available Help at Discharge: Family;Available PRN/intermittently Type of Home: House Home Access: Level entry     Home Layout: One level Home Equipment: Walker - 2 wheels;Cane - quad      Prior Function Level of Independence: Independent with assistive device(s)         Comments: was using cane for mobility     Hand Dominance   Dominant Hand: Right    Extremity/Trunk Assessment   Upper Extremity Assessment Upper Extremity Assessment: Defer to OT evaluation    Lower Extremity Assessment Lower Extremity Assessment: Generalized weakness    Cervical / Trunk Assessment Cervical / Trunk Assessment: Normal  Communication   Communication: No difficulties  Cognition Arousal/Alertness: Awake/alert Behavior During Therapy: WFL for tasks assessed/performed Overall Cognitive Status: Within Functional Limits for tasks assessed  General Comments      Exercises     Assessment/Plan    PT Assessment Patient needs continued PT services  PT Problem List Decreased activity tolerance;Decreased balance;Decreased mobility;Pain       PT Treatment Interventions DME instruction;Gait training;Functional mobility  training;Therapeutic activities;Therapeutic exercise;Balance training;Patient/family education    PT Goals (Current goals can be found in the Care Plan section)  Acute Rehab PT Goals Patient Stated Goal: to feel better PT Goal Formulation: With patient Time For Goal Achievement: 08/15/16 Potential to Achieve Goals: Good    Frequency Min 2X/week   Barriers to discharge        Co-evaluation               End of Session Equipment Utilized During Treatment: Gait belt;Oxygen Activity Tolerance: Patient tolerated treatment well Patient left: in chair;with call bell/phone within reach Nurse Communication: Mobility status PT Visit Diagnosis: Muscle weakness (generalized) (M62.81);Difficulty in walking, not elsewhere classified (R26.2);Pain Pain - Right/Left: Right Pain - part of body: Ankle and joints of foot    Time: 3748-2707 PT Time Calculation (min) (ACUTE ONLY): 18 min   Charges:   PT Evaluation $PT Eval Low Complexity: 1 Procedure     PT G Codes:        Scheryl Marten PT, DPT  [phone]   Jacqulyn Liner Sloan Leiter 08/08/2016, 8:35 AM

## 2016-08-08 NOTE — Progress Notes (Signed)
Occupational Therapy Evaluation Patient Details Name: [name] MRN: [id number] DOB: [birth year] Today's Date: 08/08/2016    History of Present Illness Pt admitted on 08/07/16 with worsening dyspnea. Pt diagnosed with CHF exacerbation. PMh significant for CKD, HTN.    Clinical Impression   Pt admitted with the above diagnoses and presents with below problem list. Pt will benefit from continued acute OT to address the below listed deficits and maximize independence with basic ADLs prior to d/c to venue below. PTA pt was mod I with ADLs, lives alone, drives, and is active in her church. Pt currently is setup to min guard with ADLs. Pt's HR while standing at sink to brush teeth was 98-99. Some SOB throughout session. Encouraged seated rest break prior to ambulating back to recliner. Pt plans to discuss d/c plan with daughter. She did mention that her daughter would like to care for her at home but she is also currently providing some level of care-giving assistance to her own spouse (pt's son-in-law). Will follow.      Follow Up Recommendations  SNF;Supervision/Assistance - 24 hour    Equipment Recommendations  Other (comment) (defer to next venue, 3n1 to use as shower seat if d/c home)    Recommendations for Other Services       Precautions / Restrictions Precautions Precautions: Fall Restrictions Weight Bearing Restrictions: No      Mobility Bed Mobility Overal bed mobility: Modified Independent             General bed mobility comments: up in chair  Transfers Overall transfer level: Needs assistance Equipment used: Rolling walker (2 wheeled) Transfers: Sit to/from Stand Sit to Stand: Min guard         General transfer comment: Min guard for safety from EOB    Balance Overall balance assessment: Needs assistance Sitting-balance support: No upper extremity supported;Feet supported Sitting balance-Leahy Scale: Good     Standing balance support: No upper  extremity supported Standing balance-Leahy Scale: Fair Standing balance comment: stood at sink for oral care with external suport at times of sink                           ADL either performed or assessed with clinical judgement   ADL Overall ADL's : Needs assistance/impaired Eating/Feeding: Set up;Sitting   Grooming: Min guard;Oral care;Standing Grooming Details (indicate cue type and reason): HR elevated to 99 at end of oral care in standing. Min guard for safety. Seat rest break encouraged at end of grooming tasks due to HR.  Upper Body Bathing: Set up;Supervision/ safety;Sitting   Lower Body Bathing: Min guard;Sit to/from stand   Upper Body Dressing : Set up;Sitting   Lower Body Dressing: Min guard;Sit to/from stand   Toilet Transfer: Min guard;Ambulation;RW   Toileting- Water quality scientist and Hygiene: Min guard;Sit to/from stand   Tub/ Shower Transfer: Tub transfer;Min guard;Minimal assistance;Ambulation;Rolling walker Tub/Shower Transfer Details (indicate cue type and reason): clinical judgement Functional mobility during ADLs: Min guard;Rolling walker General ADL Comments: Pt ambulated to sink and completed oral care in standing. Pt self-reporting some SOB throughtout session. HR elevated to 99 and SOB at end of task. Encouraged seat rest break.       Vision         Perception     Praxis      Pertinent Vitals/Pain Pain Assessment: Faces Faces Pain Scale: Hurts even more Pain Location: bilateral LE's in great toe Pain Descriptors / Indicators: Aching  Pain Intervention(s): Monitored during session;Repositioned     Hand Dominance Right   Extremity/Trunk Assessment Upper Extremity Assessment Upper Extremity Assessment: Overall WFL for tasks assessed;Generalized weakness   Lower Extremity Assessment Lower Extremity Assessment: Defer to PT evaluation   Cervical / Trunk Assessment Cervical / Trunk Assessment: Normal   Communication  Communication Communication: No difficulties   Cognition Arousal/Alertness: Awake/alert Behavior During Therapy: WFL for tasks assessed/performed Overall Cognitive Status: Within Functional Limits for tasks assessed                                     General Comments       Exercises     Shoulder Instructions      Home Living Family/patient expects to be discharged to:: Private residence Living Arrangements: Alone Available Help at Discharge: Family;Available PRN/intermittently Type of Home: House Home Access: Level entry     Home Layout: One level     Bathroom Shower/Tub: Teacher, early years/pre: Handicapped height     Home Equipment: Environmental consultant - 2 wheels;Cane - quad;Grab bars - tub/shower          Prior Functioning/Environment Level of Independence: Independent with assistive device(s)        Comments: was using cane for mobility; walker in community        OT Problem List: Decreased activity tolerance;Impaired balance (sitting and/or standing);Decreased knowledge of use of DME or AE;Decreased knowledge of precautions;Cardiopulmonary status limiting activity      OT Treatment/Interventions: Self-care/ADL training;Energy conservation;DME and/or AE instruction;Therapeutic activities;Patient/family education;Balance training    OT Goals(Current goals can be found in the care plan section) Acute Rehab OT Goals Patient Stated Goal: to feel better OT Goal Formulation: With patient Time For Goal Achievement: 08/22/16 Potential to Achieve Goals: Good ADL Goals Pt Will Perform Grooming: with supervision;standing;sitting (rest breaks as needed) Pt Will Perform Upper Body Bathing: with modified independence;sitting Pt Will Perform Lower Body Bathing: with modified independence;sit to/from stand Pt Will Perform Upper Body Dressing: with modified independence;sitting Pt Will Perform Lower Body Dressing: with modified independence;sit to/from  stand Pt Will Perform Tub/Shower Transfer: Tub transfer;with supervision;ambulating;rolling walker;3 in 1  OT Frequency: Min 2X/week   Barriers to D/C:    pt reports daughter could possibly assist at home but is also currently assisting spouse(pt's son-in-law) who has cancer. She plans to discuss d/c plan with daughter.       Co-evaluation              End of Session Equipment Utilized During Treatment: Gait belt;Rolling walker;Oxygen  Activity Tolerance: Other (comment);Patient tolerated treatment well (some SOB, HR elevated to 99) Patient left: in chair;with call bell/phone within reach  OT Visit Diagnosis: Unsteadiness on feet (R26.81);Other (comment) (cardiopulmonary)                Time: 4401-0272 OT Time Calculation (min): 22 min Charges:  OT General Charges $OT Visit: 1 Procedure OT Evaluation $OT Eval Low Complexity: 1 Procedure G-Codes:       Hortencia Pilar 08/08/2016, 12:12 PM

## 2016-08-08 NOTE — Progress Notes (Signed)
Patient on room air now O2 Sats 98-100%. No SOB/,no resp distress noted. Daughter at bedside.

## 2016-08-09 ENCOUNTER — Ambulatory Visit: Payer: Medicare Other | Admitting: Cardiology

## 2016-08-09 DIAGNOSIS — I509 Heart failure, unspecified: Secondary | ICD-10-CM

## 2016-08-09 DIAGNOSIS — Z7189 Other specified counseling: Secondary | ICD-10-CM

## 2016-08-09 LAB — BASIC METABOLIC PANEL
Anion gap: 9 (ref 5–15)
BUN: 67 mg/dL — AB (ref 6–20)
CHLORIDE: 99 mmol/L — AB (ref 101–111)
CO2: 30 mmol/L (ref 22–32)
CREATININE: 2.31 mg/dL — AB (ref 0.44–1.00)
Calcium: 8.5 mg/dL — ABNORMAL LOW (ref 8.9–10.3)
GFR, EST AFRICAN AMERICAN: 20 mL/min — AB (ref >60)
GFR, EST NON AFRICAN AMERICAN: 17 mL/min — AB (ref >60)
Glucose, Bld: 97 mg/dL (ref 65–99)
POTASSIUM: 3.7 mmol/L (ref 3.5–5.1)
SODIUM: 138 mmol/L (ref 135–145)

## 2016-08-09 LAB — MAGNESIUM: MAGNESIUM: 2.4 mg/dL (ref 1.7–2.4)

## 2016-08-09 MED ORDER — FUROSEMIDE 40 MG PO TABS: 40.0000 mg | ORAL_TABLET | Freq: Two times a day (BID) | ORAL | 0 refills | Status: DC

## 2016-08-09 MED ORDER — POTASSIUM CHLORIDE ER 20 MEQ PO TBCR: 20.0000 meq | EXTENDED_RELEASE_TABLET | Freq: Every day | ORAL | 0 refills | Status: DC

## 2016-08-09 NOTE — Progress Notes (Signed)
Daily Progress Note   Patient Name: [name]       Date: 08/09/2016 DOB: 1924/04/03  Age: 83 y.o. MRN#: [id number] Attending Physician: Thurnell Lose, MD Primary Care Physician: [name], MD Admit Date: 08/07/2016  Reason for Consultation/Follow-up: Disposition and Psychosocial/spiritual support. Follow-up to discuss disposition as requested by pt and her daughter.   Subjective: [name] is feeling quite good this morning and relates that she was told about discharging home today. She has no acute complaints and is comfortable with the plan to reside with her daughter after discharge.   Length of Stay: 2  Current Medications: Scheduled Meds:  . allopurinol  100 mg Oral Daily  . aspirin EC  81 mg Oral q morning - 10a  . carvedilol  3.125 mg Oral BID WC  . enoxaparin (LOVENOX) injection  30 mg Subcutaneous Q24H  . feeding supplement (ENSURE ENLIVE)  237 mL Oral BID BM  . ferrous sulfate  325 mg Oral TID WC  . furosemide  40 mg Intravenous BID    Continuous Infusions:   PRN Meds: acetaminophen, levalbuterol, ondansetron (ZOFRAN) IV, sodium chloride flush  Physical Exam  Constitutional: She is oriented to person, place, and time. No distress.  Frail older woman, appears younger than age  HENT:  Head: Normocephalic and atraumatic.  Mouth/Throat: Oropharynx is clear and moist. No oropharyngeal exudate.  Eyes: EOM are normal.  Neck: Normal range of motion. Neck supple.  Cardiovascular: Normal rate and regular rhythm.   Pulmonary/Chest: Effort normal. No respiratory distress.  Musculoskeletal: Normal range of motion.  Generalized weakness  Neurological: She is alert and oriented to person, place, and time.  Skin: Skin is warm and dry. There is pallor.  Psychiatric: She has a normal mood and affect. Her behavior is  normal. Judgment and thought content normal.           Vital Signs: BP (!) 114/56 (BP Location: Right Arm)   Pulse 81   Temp 98.5 F (36.9 C) (Oral)   Resp 20   Ht 5' 3" (1.6 m)   Wt 69.6 kg (153 lb 8 oz) Comment: scale a  SpO2 100%   BMI 27.19 kg/m  SpO2: SpO2: 100 % O2 Device: O2 Device: Not Delivered O2 Flow Rate: O2 Flow Rate (L/min): 2 L/min  Intake/output summary:  Intake/Output Summary (Last 24 hours) at 08/09/16 4496 Last data filed at 08/09/16 0900  Gross per 24 hour  Intake             1054 ml  Output             1900 ml  Net             -846 ml   LBM: Last BM Date: 08/08/16 Baseline Weight: Weight: 71.6 kg (157 lb 13.6 oz) Most recent weight: Weight: 69.6 kg (153 lb 8 oz) (scale a)       Palliative Assessment/Data: PPS 60%   Flowsheet Rows     Most Recent Value  Intake Tab  Referral Department  Hospitalist  Unit at Time of Referral  Cardiac/Telemetry Unit  Palliative Care Primary Diagnosis  Cardiac  Date Notified  08/07/16  Palliative Care Type  New Palliative care  Reason for referral  Clarify Goals of Care, Psychosocial or Spiritual support  Date of Admission  08/07/16  Date first seen by Palliative Care  08/08/16  # of days Palliative referral response time  1 Day(s)  # of days IP prior to Palliative referral  0  Clinical Assessment  Palliative Performance Scale Score  40%  Pain Max last 24 hours  0  Pain Min Last 24 hours  0  Dyspnea Max Last 24 Hours  6  Dyspnea Min Last 24 hours  3  Nausea Max Last 24 Hours  0  Nausea Min Last 24 Hours  0  Anxiety Max Last 24 Hours  0  Anxiety Min Last 24 Hours  0  Psychosocial & Spiritual Assessment  Palliative Care Outcomes  Patient/Family meeting held?  Yes  Who was at the meeting?  pt  Palliative Care Outcomes  Clarified goals of care, Changed CPR status, Provided psychosocial or spiritual support, Completed durable DNR  Patient/Family wishes: Interventions discontinued/not started   Mechanical  Ventilation, Hemodialysis, Trach  Palliative Care follow-up planned  No      Patient Active Problem List   Diagnosis Date Noted  . Palliative care by specialist   . Acute on chronic systolic heart failure, NYHA class 3 ([place]) 08/07/2016  . Acute kidney injury ([place]) 08/07/2016  . FTT (failure to thrive) in adult 08/07/2016  . Dilated cardiomyopathy ([place]) 07/19/2016  . Combined systolic and diastolic congestive heart failure, NYHA class 3 ([place]) 07/19/2016  . Anemia 06/08/2016  . Ventral hernia 09/15/2013  . CKD (chronic kidney disease) stage 4, GFR 15-29 ml/min ([facility]) 09/15/2013  . Acute exacerbation of CHF (congestive heart failure) ([place]) 09/14/2013  . Partial bowel obstruction ([place]) 09/13/2013    Palliative Care Assessment & Plan   HPI: 83 y.o. female  with past medical history of new diagnosis of systolic heart failure (diagnosed March 2018 with an EF of 20-25%), stage IV kidney disease, dilated cardiomyopathy admitted on 08/07/2016 with shortness of breath. She was admitted with diagnosis of CHF, BNP was 8.15. Her creatinine is 2.6 on admission. Until 2 months ago patient has been living alone and taking care of all of her IADLs as well as ADLs, driving.   Assessment: My partner, NP Romona Curls, met with [name] over the weekend. Please see her consult note for full details of their discussion. I followed-up today per the patient and daughter's request; they had questions regarding the discharge plan and long term plan.   I met with [name] at her bedside and she related the plan for discharge today. She had decided that going home with her daughter would be the best option, and wanted to ensure outpatient services would be available. We discussed the role of Case Management in helping set these services up, which may include a broad range of services including PT/OT, RN, aid, and home based Palliative Care.   We also talked about her understanding of her health state, which she  was able to explain was "difficult to believe," but understood her issues were not fixable and would likely worsen. Her goal is to maintain as much independence as possible for as long as possible. She does accept she will need increasing support, which she is already discussing with her family. She wants to continue receiving supportive care, including returning to the hospital as needed, for as long as it is helpful. She describes helpful as care that enables her to be at home  and enjoying her normal activities.   With permission, I then called her daughter. We discussed the plan for Mrs. Seubert to go home with her, and I reiterated the plan to utilize home health services for ongoing support. I explained the goal of home health services would be to optimize her health and independence. CM was contacted to ensure HH services are set up.   Recommendations/Plan:  DNR, confirmed at bedside again with her today  Plan for d/c to daughters home with HH services and home Palliative Care; CM contacted to assist   Her desire for ongoing supportive care, to include home PT/OT and hospitalization as needed would make her not appropriate for Hospice. If her goals change this can be re-addressed  Goals of Care and Additional Recommendations:  Limitations on Scope of Treatment: No Hemodialysis  Code Status:  DNR  Prognosis:   < 6 months  Discharge Planning:  Home with Home Health and Palliative Care  Care plan was discussed with pt and daughter.  Thank you for allowing the Palliative Medicine Team to assist in the care of this patient.  Total time: 35 minutes    Greater than 50%  of this time was spent counseling and coordinating care related to the above assessment and plan.   , NP Palliative Medicine Team [phone] pager (7a-5p) Team Phone # [phone]  

## 2016-08-09 NOTE — Progress Notes (Signed)
Discharged to home. Patient and daughter verbalized understanding of written and discharge orders to include f/u appointments, new medications and CHF information. Removed IV, catheter intact. Prescriptions given for new medications. Left unit via wheelchair, accompanied by volunteer transportation services.

## 2016-08-09 NOTE — Care Management Note (Addendum)
Case Management Note  Patient Details  Name: [name] MRN: [id number] Date of Birth: Dec 14, 1923  Subjective/Objective:   Admitted with Acute Kidney Injury                Action/Plan: Patient lives alone; PCP [name], MD; has private insurance with [facility] with prescription drug coverage; DME - she has a walker, cane and elevated commode chair/ shower chair at home; CM talked to patient to offer [place] and Palliative care choice; patient chose [facility] for [facility] services; Mary with [facility] called for arrangements; Patient also chose Hospice and Palliative care at [facility] for [place] services - referral made as requested; CM talked to her daughter with patient's permission; TCT [name] to verify address, pt will be staying at  [address] [place] [address] Tele # [phone] Patient may possibly need home 02; awaiting for 02 qualifications. 11:00- patient's nurse Benjamine Mola stated that her 02 saturations were high and she does not need any oxygen at this time.  Expected Discharge Date:    08/09/2016              Expected Discharge Plan:  [place]  In-House Referral:   Palliative Care  Discharge planning Services  CM Consult  Choice offered to:  Patient, Adult Children  HH Arranged:  RN, PT, OT, Nurse's Aide, Social Work, Refused SNF [place] Agency:  [employer] (now [facility])  Status of Service:  In process, will continue to follow  Sherrilyn Rist  08/09/2016, 10:38 AM

## 2016-08-09 NOTE — Discharge Instructions (Signed)
Follow with Primary MD [name], MD in 7 days   Get CBC, CMP, 2 view Chest X ray checked  by Primary MD or SNF MD in 5-7 days ( we routinely change or add medications that can affect your baseline labs and fluid status, therefore we recommend that you get the mentioned basic workup next visit with your PCP, your PCP may decide not to get them or add new tests based on their clinical decision)  Activity: As tolerated with Full fall precautions use walker/cane & assistance as needed  Disposition Home    Diet: Heart Healthy with Fluid restriction: 1500 mL Fluid.  Check your Weight same time everyday, if you gain over 2 pounds, or you develop in leg swelling, experience more shortness of breath or chest pain, call your Primary MD immediately. Follow Cardiac Low Salt Diet and 1.5 lit/day fluid restriction.  On your next visit with your primary care physician please Get Medicines reviewed and adjusted.  Please request your Prim.MD to go over all Hospital Tests and Procedure/Radiological results at the follow up, please get all Hospital records sent to your Prim MD by signing hospital release before you go home.  If you experience worsening of your admission symptoms, develop shortness of breath, life threatening emergency, suicidal or homicidal thoughts you must seek medical attention immediately by calling 911 or calling your MD immediately  if symptoms less severe.  You Must read complete instructions/literature along with all the possible adverse reactions/side effects for all the Medicines you take and that have been prescribed to you. Take any new Medicines after you have completely understood and accpet all the possible adverse reactions/side effects.   Do not drive, operate heavy machinery, perform activities at heights, swimming or participation in water activities or provide baby sitting services if your were admitted for syncope or siezures until you have seen by Primary MD or a  Neurologist and advised to do so again.  Do not drive when taking Pain medications.    Do not take more than prescribed Pain, Sleep and Anxiety Medications  Special Instructions: If you have smoked or chewed Tobacco  in the last 2 yrs please stop smoking, stop any regular Alcohol  and or any Recreational drug use.  Wear Seat belts while driving.   Please note  You were cared for by a hospitalist during your hospital stay. If you have any questions about your discharge medications or the care you received while you were in the hospital after you are discharged, you can call the unit and asked to speak with the hospitalist on call if the hospitalist that took care of you is not available. Once you are discharged, your primary care physician will handle any further medical issues. Please note that NO REFILLS for any discharge medications will be authorized once you are discharged, as it is imperative that you return to your primary care physician (or establish a relationship with a primary care physician if you do not have one) for your aftercare needs so that they can reassess your need for medications and monitor your lab values.

## 2016-08-09 NOTE — Clinical Social Work Note (Signed)
CSW met with patient and her daughter at bedside and confirmed plan for discharge home with daughter today instead of SNF.   CSW signing off.  Dayton Scrape, [place]

## 2016-08-09 NOTE — Progress Notes (Signed)
SATURATION QUALIFICATIONS: (This note is used to comply with regulatory documentation for home oxygen)  Patient Saturations on Room Air at Rest = 97%  Patient Saturations on Room Air while Ambulating = 95%  Patient Saturations on 2 Liters of oxygen while Ambulating = 96%  Please briefly explain why patient needs home oxygen:

## 2016-08-09 NOTE — Discharge Summary (Signed)
[name] DTO:[id number] DOB: [birth year] DOA: 08/07/2016  PCP: [name], MD  Admit date: 08/07/2016  Discharge date: 08/09/2016  Admitted From: Home   Disposition:  Home   Recommendations for Outpatient Follow-up:   Follow up with PCP in 1-2 weeks  PCP Please obtain BMP/CBC, 2 view CXR in 1week,  (see Discharge instructions)   PCP Please follow up on the following pending results: Monitor BMP in diuretic dose closely   Home Health: PT, RN, S Work   Equipment/Devices: None  Consultations: [place] Discharge Condition: Fair   CODE STATUS: DNR   Diet Recommendation: Are healthy with 1.5 L daily fluid restriction   Chief Complaint  Patient presents with  . Shortness of Breath  . Cough     Brief history of present illness from the day of admission and additional interim summary    [name] a 83 y.o.femalewith medical history significant for chronic systolic heart failure NYHA 3 EF 20%, stage IV chronic kidney disease, hypertension, and anemia. Patient was recently discharged in March 9833 with systolic heart failure was admitted back for shortness of breath caused by CHF.                                                                 Hospital Course    1.Acute on chronic combined systolic and diastolic heart failure EF 20% on recent echocardiogram done few months ago - improved with IV Lasix And low-dose Coreg, now symptom-free and off oxygen, renal function precludes addition of ACE/ARB. Blood pressure is improved so commence hydralazine and Imdur. Continue salt and fluid restriction upon discharge.   Have increased her home diuretic dose, ordered home RN to monitor her weight and diuresis closely, will also request PCP and primary cardiologist to monitor her BMP in diuretic dose  closely. Due to her advanced age and severe systolic dysfunction will involve palliative care as well to address goals of care. On her recent hospitalization she was seen by cardiology and was considered only a medical management candidate.   2. CK D stage IV. Mild ARF due to CHF decompensation. With diuresis creatinine improving and close to her baseline of 2.4.  3. Hypertension. Blood pressure stable now continue home regimen, PCP to monitor blood pressure and adjust as needed.  4. Anemia of chronic disease. Stable no acute issues.   Discharge diagnosis     Principal Problem:   Acute kidney injury ([place]) Active Problems:   Acute exacerbation of CHF (congestive heart failure) (HCC)   CKD (chronic kidney disease) stage 4, GFR 15-29 ml/min (HCC)   Acute on chronic systolic heart failure, NYHA class 3 (HCC)   FTT (failure to thrive) in adult   Palliative care by specialist   Goals of care, counseling/discussion    Discharge instructions  Discharge Instructions    Discharge instructions    Complete by:  As directed    Follow with Primary MD [name], MD in 7 days   Get CBC, CMP, 2 view Chest X ray checked  by Primary MD or SNF MD in 5-7 days ( we routinely change or add medications that can affect your baseline labs and fluid status, therefore we recommend that you get the mentioned basic workup next visit with your PCP, your PCP may decide not to get them or add new tests based on their clinical decision)  Activity: As tolerated with Full fall precautions use walker/cane & assistance as needed  Disposition Home    Diet: Heart Healthy with Fluid restriction: 1500 mL Fluid.  Check your Weight same time everyday, if you gain over 2 pounds, or you develop in leg swelling, experience more shortness of breath or chest pain, call your Primary MD immediately. Follow Cardiac Low Salt Diet and 1.5 lit/day fluid restriction.  On your next visit with your primary care physician  please Get Medicines reviewed and adjusted.  Please request your Prim.MD to go over all Hospital Tests and Procedure/Radiological results at the follow up, please get all Hospital records sent to your Prim MD by signing hospital release before you go home.  If you experience worsening of your admission symptoms, develop shortness of breath, life threatening emergency, suicidal or homicidal thoughts you must seek medical attention immediately by calling 911 or calling your MD immediately  if symptoms less severe.  You Must read complete instructions/literature along with all the possible adverse reactions/side effects for all the Medicines you take and that have been prescribed to you. Take any new Medicines after you have completely understood and accpet all the possible adverse reactions/side effects.   Do not drive, operate heavy machinery, perform activities at heights, swimming or participation in water activities or provide baby sitting services if your were admitted for syncope or siezures until you have seen by Primary MD or a Neurologist and advised to do so again.  Do not drive when taking Pain medications.    Do not take more than prescribed Pain, Sleep and Anxiety Medications  Special Instructions: If you have smoked or chewed Tobacco  in the last 2 yrs please stop smoking, stop any regular Alcohol  and or any Recreational drug use.  Wear Seat belts while driving.   Please note  You were cared for by a hospitalist during your hospital stay. If you have any questions about your discharge medications or the care you received while you were in the hospital after you are discharged, you can call the unit and asked to speak with the hospitalist on call if the hospitalist that took care of you is not available. Once you are discharged, your primary care physician will handle any further medical issues. Please note that NO REFILLS for any discharge medications will be authorized once you  are discharged, as it is imperative that you return to your primary care physician (or establish a relationship with a primary care physician if you do not have one) for your aftercare needs so that they can reassess your need for medications and monitor your lab values.   Increase activity slowly    Complete by:  As directed       Discharge Medications   Allergies as of 08/09/2016   No Known Allergies     Medication List    STOP taking these medications   amoxicillin-clavulanate 500-125 MG  tablet Commonly known as:  AUGMENTIN     TAKE these medications   acetaminophen 325 MG tablet Commonly known as:  TYLENOL Take 650 mg by mouth every 6 (six) hours as needed for mild pain.   ALIGN PO Take 1 capsule by mouth daily.   allopurinol 100 MG tablet Commonly known as:  ZYLOPRIM Take 100 mg by mouth daily.   aspirin EC 81 MG tablet Take 81 mg by mouth every morning.   carvedilol 6.25 MG tablet Commonly known as:  COREG Take 1 tablet (6.25 mg total) by mouth 2 (two) times daily with a meal.   ferrous sulfate 325 (65 FE) MG tablet Take 1 tablet (325 mg total) by mouth 3 (three) times daily with meals.   furosemide 40 MG tablet Commonly known as:  LASIX Take 1 tablet (40 mg total) by mouth 2 (two) times daily. Take 40 mg in the morning and 20 mg in afternoon, may take extra 20 mg dose in the afternoon as needed What changed:  how much to take  how to take this  when to take this   hydrALAZINE 25 MG tablet Commonly known as:  APRESOLINE Take 1 tablet (25 mg total) by mouth 2 (two) times daily.   isosorbide mononitrate 30 MG 24 hr tablet Commonly known as:  IMDUR Take 1 tablet (30 mg total) by mouth daily.   levalbuterol 45 MCG/ACT inhaler Commonly known as:  XOPENEX HFA Inhale 1 puff into the lungs every 4 (four) hours as needed for wheezing.   metolazone 2.5 MG tablet Commonly known as:  ZAROXOLYN Take 1 tablet (2.5 mg total) by mouth 2 (two) times a week.     NONFORMULARY OR COMPOUNDED [facility] compound:  Peripheral Neuropathy cream - Bupivacaine 1%, Doxepin 3%, Gabapentin 6%, Pentoxifylline 3%, Topiramate 1%, dispense 120gram apply 102 grams to affected area 3-4 times daily, +3refills.   Potassium Chloride ER 20 MEQ Tbcr Take 20 mEq by mouth daily.   PRESERVISION AREDS PO Take 1 capsule by mouth daily.       Follow-up Information    [facility] Follow up.   Specialty:  [place] Why:  They will do your home health care at your daughters home Contact information: [place] [address] [phone]        [facility] Follow up.   Specialty:  Hospice and Palliative Medicine Why:  Palliative Care will be following your at your daughters home Contact information: [place] [address] [place], [place]. Schedule an appointment as soon as possible for a visit in 1 week(s).   Specialty:  Internal Medicine Contact information: [address] [place] [address] [phone]           Major procedures and Radiology Reports - PLEASE review detailed and final reports thoroughly  -         Dg Chest 2 View  Result Date: 08/08/2016 CLINICAL DATA:  Shortness of breath and cough for 2 weeks. Hypertension. EXAM: CHEST  2 VIEW COMPARISON:  August 07, 2016 FINDINGS: There are bilateral pleural effusions with bibasilar atelectasis. There is no frank edema or consolidation. There is cardiomegaly with pulmonary vascularity within normal limits. There is aortic atherosclerosis. No adenopathy. There is degenerative change in each shoulder and in the thoracic spine. There is postoperative change in the cervical spine. IMPRESSION: Cardiomegaly with persistent pleural effusions. Suspect a  degree of congestive heart failure. Bibasilar atelectasis. No frank edema or consolidation. Aortic atherosclerosis. Electronically  Signed   By: Lowella Grip III M.D.   On: 08/08/2016 08:20   Dg Chest 2 View  Result Date: 08/07/2016 CLINICAL DATA:  Shortness of breath, persistent cough for several weeks EXAM: CHEST  2 VIEW COMPARISON:  06/09/2016 FINDINGS: There are bilateral small pleural effusions, left greater than right. There is bilateral mild interstitial thickening. There is no focal consolidation. There is no pneumothorax. There is mild stable cardiomegaly. The osseous structures are unremarkable. IMPRESSION: Findings concerning for mild CHF. Electronically Signed   By: Kathreen Devoid   On: 08/07/2016 12:19    Micro Results     Recent Results (from the past 240 hour(s))  Urine culture     Status: Abnormal   Collection Time: 08/07/16  7:32 PM  Result Value Ref Range Status   Specimen Description URINE, RANDOM  Final   Special Requests NONE  Final   Culture <10,000 COLONIES/mL INSIGNIFICANT GROWTH (A)  Final   Report Status 08/08/2016 FINAL  Final    Today   Subjective    [name] today has no headache,no chest abdominal pain,no new weakness tingling or numbness, feels much better wants to go home today.     Objective   Blood pressure (!) 114/56, pulse 95, temperature 98.5 F (36.9 C), temperature source Oral, resp. rate 20, height 5\' 3"  (1.6 m), weight 69.6 kg (153 lb 8 oz), SpO2 96 %.   Intake/Output Summary (Last 24 hours) at 08/09/16 1116 Last data filed at 08/09/16 0900  Gross per 24 hour  Intake              817 ml  Output             1900 ml  Net            -1083 ml    Exam Awake Alert, Oriented x 3, No new F.N deficits, Normal affect [place].AT,PERRAL Supple Neck,No JVD, No cervical lymphadenopathy appriciated.  Symmetrical Chest wall movement, Good air movement bilaterally, CTAB RRR,No Gallops,Rubs or new Murmurs, No Parasternal Heave +ve B.Sounds, Abd Soft, Non tender, No organomegaly appriciated, No rebound -guarding or rigidity. No Cyanosis, Clubbing or edema, No new Rash or  bruise   Data Review   CBC w Diff: Lab Results  Component Value Date   WBC 11.2 (H) 08/07/2016   HGB 10.0 (L) 08/07/2016   HCT 30.0 (L) 08/07/2016   PLT 267 08/07/2016   LYMPHOPCT 18 09/13/2013   MONOPCT 6 09/13/2013   EOSPCT 2 09/13/2013   BASOPCT 0 09/13/2013    CMP: Lab Results  Component Value Date   NA 138 08/09/2016   NA 141 07/19/2016   K 3.7 08/09/2016   CL 99 (L) 08/09/2016   CO2 30 08/09/2016   BUN 67 (H) 08/09/2016   BUN 50 (H) 07/19/2016   CREATININE 2.31 (H) 08/09/2016   PROT 8.1 09/13/2013   ALBUMIN 4.1 09/13/2013   BILITOT <0.2 (L) 09/13/2013   ALKPHOS 142 (H) 09/13/2013   AST 18 09/13/2013   ALT 12 09/13/2013  .   Total Time in preparing paper work, data evaluation and todays exam - 55 minutes  Lala Lund M.D on 08/09/2016 at 11:16 AM  [facility]   Office  [phone]

## 2016-08-09 NOTE — Progress Notes (Signed)
PT Cancellation Note  Patient Details Name: [name] MRN: [id number] DOB: August 06, 1923   Cancelled Treatment:    Reason Eval/Treat Not Completed: Other (comment).  Leaving imminently with all family just waiting for transport to car.   Ramond Dial 08/09/2016, 1:51 PM   Mee Hives, PT MS Acute Rehab Dept. Number: [place] [facility] [place]

## 2016-08-09 NOTE — Progress Notes (Signed)
Occupational Therapy Treatment Patient Details Name: [name] MRN: [id number] DOB: 06/19/1923 Today's Date: 08/09/2016    History of present illness Pt admitted on 08/07/16 with worsening dyspnea. Pt diagnosed with CHF exacerbation. PMh significant for CKD, HTN.    OT comments  Pt in chair talking with her daughter upon arrival. Provided pt and daughter with education on energy conservation (EC) and issued handout. Pt and daughter verbalized understanding and discussed ways to implement EC ideas at home. Pt performed dressing with Min guard A for sit<>stand; pt demonstrates safe technique. Pt going home to daughter's house. Pt demonstrating safe functional performance and confirmed to have support and needed DME/AE at dc.    Follow Up Recommendations  SNF;Supervision/Assistance - 24 hour    Equipment Recommendations  Other (comment) (Pt daughter confirms they have DME and AE for dc home)    Recommendations for Other Services      Precautions / Restrictions Precautions Precautions: Fall Restrictions Weight Bearing Restrictions: No       Mobility Bed Mobility               General bed mobility comments: up in chair  Transfers Overall transfer level: Needs assistance Equipment used: None Transfers: Sit to/from Stand Sit to Stand: Min guard         General transfer comment: Min guard for safety     Balance Overall balance assessment: Needs assistance Sitting-balance support: No upper extremity supported;Feet supported Sitting balance-Leahy Scale: Good     Standing balance support: No upper extremity supported Standing balance-Leahy Scale: Good Standing balance comment: Stood to perform LB dressing without UE support to maintain balance                           ADL either performed or assessed with clinical judgement   ADL Overall ADL's : Needs assistance/impaired                 Upper Body Dressing : Set up;Sitting Upper Body  Dressing Details (indicate cue type and reason): Pt donned shirt Lower Body Dressing: Min guard;Sit to/from stand;Supervision/safety Lower Body Dressing Details (indicate cue type and reason): Pt required Min guard for inital sit<>stand but demosntrated steady balance for LB dressing               General ADL Comments: Pt demonstrated safe LB ADL techniques to dress for dc. Educated pt and daughter on energy conservation and provided [facility] handout     Vision       Perception     Praxis      Cognition Arousal/Alertness: Awake/alert Behavior During Therapy: WFL for tasks assessed/performed Overall Cognitive Status: Within Functional Limits for tasks assessed                                          Exercises     Shoulder Instructions       General Comments      Pertinent Vitals/ Pain       Pain Assessment: Faces Faces Pain Scale: Hurts a little bit Pain Location: bilateral LE Pain Descriptors / Indicators: Aching Pain Intervention(s): Monitored during session  Home Living  Prior Functioning/Environment              Frequency  Min 2X/week        Progress Toward Goals  OT Goals(current goals can now be found in the care plan section)  Progress towards OT goals: Progressing toward goals  Acute Rehab OT Goals Patient Stated Goal: to feel better OT Goal Formulation: With patient Time For Goal Achievement: 08/22/16 Potential to Achieve Goals: Good ADL Goals Pt Will Perform Grooming: with supervision;standing;sitting (rest breaks as needed) Pt Will Perform Upper Body Bathing: with modified independence;sitting Pt Will Perform Lower Body Bathing: with modified independence;sit to/from stand Pt Will Perform Upper Body Dressing: with modified independence;sitting Pt Will Perform Lower Body Dressing: with modified independence;sit to/from stand Pt Will Perform Tub/Shower Transfer: Tub  transfer;with supervision;ambulating;rolling walker;3 in 1  Plan Discharge plan remains appropriate    Co-evaluation                 AM-PAC PT "6 Clicks" Daily Activity     Outcome Measure   Help from another person eating meals?: None Help from another person taking care of personal grooming?: None Help from another person toileting, which includes using toliet, bedpan, or urinal?: A Little Help from another person bathing (including washing, rinsing, drying)?: A Little Help from another person to put on and taking off regular upper body clothing?: None Help from another person to put on and taking off regular lower body clothing?: A Little 6 Click Score: 21    End of Session Equipment Utilized During Treatment: Gait belt  OT Visit Diagnosis: Unsteadiness on feet (R26.81);Other (comment) (cardiopulmonary)   Activity Tolerance Patient tolerated treatment well   Patient Left in chair;with call bell/phone within reach;with family/visitor present   Nurse Communication Mobility status        Time: 1212-1229 OT Time Calculation (min): 17 min  Charges: OT General Charges $OT Visit: 1 Procedure OT Treatments $Self Care/Home Management : 8-22 mins  [place], OTR/L [facility] 08/09/2016, 12:45 PM

## 2016-10-07 ENCOUNTER — Other Ambulatory Visit (HOSPITAL_COMMUNITY): Payer: Self-pay

## 2016-10-11 ENCOUNTER — Encounter (HOSPITAL_COMMUNITY)
Admission: RE | Admit: 2016-10-11 | Discharge: 2016-10-11 | Disposition: A | Discharge status: Home / Self Care | Payer: Medicare Other | Source: Ambulatory Visit | Attending: Nephrology | Admitting: Nephrology

## 2016-10-11 DIAGNOSIS — D631 Anemia in chronic kidney disease: Secondary | ICD-10-CM | POA: Insufficient documentation

## 2016-10-11 MED ORDER — SODIUM CHLORIDE 0.9 % IV SOLN
510.0000 mg | INTRAVENOUS | Status: DC
  Administered 2016-10-11: 510 mg via INTRAVENOUS
  Filled 2016-10-11: qty 17

## 2016-10-11 NOTE — Discharge Instructions (Signed)

## 2016-10-18 ENCOUNTER — Encounter (HOSPITAL_COMMUNITY): Payer: Medicare Other

## 2016-10-18 ENCOUNTER — Encounter (HOSPITAL_COMMUNITY)
Admission: RE | Admit: 2016-10-18 | Discharge: 2016-10-18 | Disposition: A | Discharge status: Home / Self Care | Payer: Medicare Other | Source: Ambulatory Visit | Attending: Nephrology | Admitting: Nephrology

## 2016-10-18 DIAGNOSIS — D631 Anemia in chronic kidney disease: Secondary | ICD-10-CM | POA: Diagnosis not present

## 2016-10-18 MED ORDER — SODIUM CHLORIDE 0.9 % IV SOLN
510.0000 mg | INTRAVENOUS | Status: DC
  Administered 2016-10-18: 510 mg via INTRAVENOUS
  Filled 2016-10-18: qty 17

## 2016-10-19 ENCOUNTER — Encounter (HOSPITAL_COMMUNITY): Payer: Medicare Other

## 2016-11-15 ENCOUNTER — Other Ambulatory Visit: Payer: Self-pay | Admitting: *Deleted

## 2016-11-15 MED ORDER — METOLAZONE 2.5 MG PO TABS: 2.5000 mg | ORAL_TABLET | ORAL | 3 refills | Status: DC

## 2016-11-15 MED ORDER — ISOSORBIDE MONONITRATE ER 30 MG PO TB24: 30.0000 mg | ORAL_TABLET | Freq: Every day | ORAL | 2 refills | Status: DC

## 2016-11-25 ENCOUNTER — Other Ambulatory Visit: Payer: Self-pay | Admitting: Cardiology

## 2016-12-06 ENCOUNTER — Other Ambulatory Visit: Payer: Self-pay | Admitting: Cardiology

## 2016-12-06 NOTE — Telephone Encounter (Signed)
Rx request sent to pharmacy.  

## 2016-12-13 ENCOUNTER — Other Ambulatory Visit: Payer: Self-pay | Admitting: Cardiology

## 2016-12-14 NOTE — Telephone Encounter (Signed)
Rx(s) sent to pharmacy electronically.  

## 2017-02-06 ENCOUNTER — Other Ambulatory Visit: Payer: Self-pay | Admitting: Cardiology

## 2017-04-17 ENCOUNTER — Other Ambulatory Visit: Payer: Self-pay | Admitting: Cardiology

## 2017-07-10 ENCOUNTER — Other Ambulatory Visit: Payer: Self-pay | Admitting: Cardiology

## 2017-07-11 NOTE — Telephone Encounter (Signed)
REFILL 

## 2017-08-18 ENCOUNTER — Other Ambulatory Visit: Payer: Self-pay | Admitting: Cardiology

## 2017-09-09 ENCOUNTER — Other Ambulatory Visit: Payer: Self-pay | Admitting: Cardiology

## 2017-09-09 ENCOUNTER — Other Ambulatory Visit: Payer: Self-pay

## 2017-09-09 MED ORDER — FUROSEMIDE 40 MG PO TABS: ORAL_TABLET | ORAL | 0 refills | Status: DC

## 2017-09-09 NOTE — Telephone Encounter (Signed)
Rx sent to pharmacy   

## 2017-09-23 ENCOUNTER — Other Ambulatory Visit: Payer: Self-pay | Admitting: Cardiology

## 2017-10-02 ENCOUNTER — Other Ambulatory Visit: Payer: Self-pay | Admitting: Cardiology

## 2017-10-03 NOTE — Telephone Encounter (Signed)
Rx request sent to pharmacy.  

## 2017-10-19 ENCOUNTER — Other Ambulatory Visit: Payer: Self-pay | Admitting: Cardiology

## 2017-11-02 ENCOUNTER — Other Ambulatory Visit: Payer: Self-pay | Admitting: Internal Medicine

## 2017-11-02 DIAGNOSIS — R269 Unspecified abnormalities of gait and mobility: Secondary | ICD-10-CM

## 2017-11-02 DIAGNOSIS — R2689 Other abnormalities of gait and mobility: Secondary | ICD-10-CM

## 2017-11-10 ENCOUNTER — Other Ambulatory Visit: Payer: Self-pay | Admitting: Cardiology

## 2017-11-11 ENCOUNTER — Ambulatory Visit
Admission: RE | Admit: 2017-11-11 | Discharge: 2017-11-11 | Disposition: A | Discharge status: Home / Self Care | Payer: Medicare Other | Source: Ambulatory Visit | Attending: Internal Medicine | Admitting: Internal Medicine

## 2017-11-11 DIAGNOSIS — R2689 Other abnormalities of gait and mobility: Secondary | ICD-10-CM

## 2017-11-11 DIAGNOSIS — R269 Unspecified abnormalities of gait and mobility: Secondary | ICD-10-CM

## 2017-11-14 DIAGNOSIS — I10 Essential (primary) hypertension: Secondary | ICD-10-CM | POA: Insufficient documentation

## 2017-11-17 ENCOUNTER — Other Ambulatory Visit: Payer: Self-pay | Admitting: Cardiology

## 2017-11-17 NOTE — Telephone Encounter (Signed)
Rx sent to pharmacy   

## 2017-12-18 ENCOUNTER — Other Ambulatory Visit: Payer: Self-pay | Admitting: Cardiology

## 2018-01-02 ENCOUNTER — Other Ambulatory Visit: Payer: Self-pay | Admitting: Cardiology

## 2018-01-04 ENCOUNTER — Other Ambulatory Visit: Payer: Self-pay | Admitting: Cardiology

## 2018-01-29 ENCOUNTER — Other Ambulatory Visit: Payer: Self-pay | Admitting: Cardiology

## 2018-01-31 NOTE — Telephone Encounter (Signed)
Rx request sent to pharmacy.  

## 2018-02-14 ENCOUNTER — Other Ambulatory Visit: Payer: Self-pay | Admitting: Cardiology

## 2019-09-16 ENCOUNTER — Other Ambulatory Visit: Payer: Self-pay

## 2019-09-16 ENCOUNTER — Encounter (HOSPITAL_COMMUNITY): Payer: Self-pay | Admitting: Emergency Medicine

## 2019-09-16 ENCOUNTER — Emergency Department (HOSPITAL_COMMUNITY)
Admission: EM | Admit: 2019-09-16 | Discharge: 2019-09-16 | Disposition: A | Discharge status: Home / Self Care | Payer: Medicare PPO | Attending: Emergency Medicine | Admitting: Emergency Medicine

## 2019-09-16 ENCOUNTER — Emergency Department (HOSPITAL_COMMUNITY): Payer: Medicare PPO

## 2019-09-16 DIAGNOSIS — Z79899 Other long term (current) drug therapy: Secondary | ICD-10-CM | POA: Diagnosis not present

## 2019-09-16 DIAGNOSIS — N184 Chronic kidney disease, stage 4 (severe): Secondary | ICD-10-CM | POA: Diagnosis not present

## 2019-09-16 DIAGNOSIS — I5042 Chronic combined systolic (congestive) and diastolic (congestive) heart failure: Secondary | ICD-10-CM | POA: Insufficient documentation

## 2019-09-16 DIAGNOSIS — R531 Weakness: Secondary | ICD-10-CM | POA: Insufficient documentation

## 2019-09-16 DIAGNOSIS — I130 Hypertensive heart and chronic kidney disease with heart failure and stage 1 through stage 4 chronic kidney disease, or unspecified chronic kidney disease: Secondary | ICD-10-CM | POA: Insufficient documentation

## 2019-09-16 DIAGNOSIS — Z7982 Long term (current) use of aspirin: Secondary | ICD-10-CM | POA: Insufficient documentation

## 2019-09-16 DIAGNOSIS — R200 Anesthesia of skin: Secondary | ICD-10-CM | POA: Diagnosis not present

## 2019-09-16 DIAGNOSIS — Z87891 Personal history of nicotine dependence: Secondary | ICD-10-CM | POA: Diagnosis not present

## 2019-09-16 DIAGNOSIS — R519 Headache, unspecified: Secondary | ICD-10-CM | POA: Insufficient documentation

## 2019-09-16 LAB — PROTIME-INR
INR: 1.2 (ref 0.8–1.2)
Prothrombin Time: 14.3 seconds (ref 11.4–15.2)

## 2019-09-16 LAB — DIFFERENTIAL
Abs Immature Granulocytes: 0.03 10*3/uL (ref 0.00–0.07)
Basophils Absolute: 0 10*3/uL (ref 0.0–0.1)
Basophils Relative: 0 %
Eosinophils Absolute: 0.1 10*3/uL (ref 0.0–0.5)
Eosinophils Relative: 2 %
Immature Granulocytes: 0 %
Lymphocytes Relative: 16 %
Lymphs Abs: 1.4 10*3/uL (ref 0.7–4.0)
Monocytes Absolute: 0.5 10*3/uL (ref 0.1–1.0)
Monocytes Relative: 6 %
Neutro Abs: 6.7 10*3/uL (ref 1.7–7.7)
Neutrophils Relative %: 76 %

## 2019-09-16 LAB — RAPID URINE DRUG SCREEN, HOSP PERFORMED
Amphetamines: NOT DETECTED
Barbiturates: NOT DETECTED
Benzodiazepines: NOT DETECTED
Cocaine: NOT DETECTED
Opiates: NOT DETECTED
Tetrahydrocannabinol: NOT DETECTED

## 2019-09-16 LAB — COMPREHENSIVE METABOLIC PANEL
ALT: 11 U/L (ref 0–44)
AST: 23 U/L (ref 15–41)
Albumin: 3.5 g/dL (ref 3.5–5.0)
Alkaline Phosphatase: 96 U/L (ref 38–126)
Anion gap: 16 — ABNORMAL HIGH (ref 5–15)
BUN: 60 mg/dL — ABNORMAL HIGH (ref 8–23)
CO2: 23 mmol/L (ref 22–32)
Calcium: 9.1 mg/dL (ref 8.9–10.3)
Chloride: 99 mmol/L (ref 98–111)
Creatinine, Ser: 2.54 mg/dL — ABNORMAL HIGH (ref 0.44–1.00)
GFR calc Af Amer: 18 mL/min — ABNORMAL LOW (ref >60)
GFR calc non Af Amer: 16 mL/min — ABNORMAL LOW (ref >60)
Glucose, Bld: 131 mg/dL — ABNORMAL HIGH (ref 70–99)
Potassium: 4.4 mmol/L (ref 3.5–5.1)
Sodium: 138 mmol/L (ref 135–145)
Total Bilirubin: 0.6 mg/dL (ref 0.3–1.2)
Total Protein: 7.7 g/dL (ref 6.5–8.1)

## 2019-09-16 LAB — I-STAT CHEM 8, ED
BUN: 60 mg/dL — ABNORMAL HIGH (ref 8–23)
Calcium, Ion: 1.05 mmol/L — ABNORMAL LOW (ref 1.15–1.40)
Chloride: 101 mmol/L (ref 98–111)
Creatinine, Ser: 2.8 mg/dL — ABNORMAL HIGH (ref 0.44–1.00)
Glucose, Bld: 128 mg/dL — ABNORMAL HIGH (ref 70–99)
HCT: 37 % (ref 36.0–46.0)
Hemoglobin: 12.6 g/dL (ref 12.0–15.0)
Potassium: 4 mmol/L (ref 3.5–5.1)
Sodium: 138 mmol/L (ref 135–145)
TCO2: 28 mmol/L (ref 22–32)

## 2019-09-16 LAB — CBC
HCT: 35.4 % — ABNORMAL LOW (ref 36.0–46.0)
Hemoglobin: 11.6 g/dL — ABNORMAL LOW (ref 12.0–15.0)
MCH: 30.2 pg (ref 26.0–34.0)
MCHC: 32.8 g/dL (ref 30.0–36.0)
MCV: 92.2 fL (ref 80.0–100.0)
Platelets: 186 10*3/uL (ref 150–400)
RBC: 3.84 MIL/uL — ABNORMAL LOW (ref 3.87–5.11)
RDW: 14.7 % (ref 11.5–15.5)
WBC: 8.8 10*3/uL (ref 4.0–10.5)
nRBC: 0 % (ref 0.0–0.2)

## 2019-09-16 LAB — APTT: aPTT: 26 seconds (ref 24–36)

## 2019-09-16 MED ORDER — SODIUM CHLORIDE 0.9 % IV SOLN: 100.0000 mL/h | INTRAVENOUS | Status: DC

## 2019-09-16 MED ORDER — SODIUM CHLORIDE 0.9 % IV BOLUS
500.0000 mL | Freq: Once | INTRAVENOUS | Status: AC
  Administered 2019-09-16: 500 mL via INTRAVENOUS

## 2019-09-16 NOTE — ED Provider Notes (Signed)
[facility] EMERGENCY DEPARTMENT Provider Note   CSN: [id number] Arrival date & time: 09/16/19  1227     History Chief Complaint  Patient presents with   facial pain   Weakness    [name] is a 86 y.o. female.  HPI     Patient presents with her daughter who assists with the HPI. Patient presents today with concern for right facial weakness, sensation, and right arm weakness as well. Initial the patient states that she was in her usual state of health until about 2 or 3 days ago when she developed a sensation of weakness in her right upper face.  This was persistent, unchanged until today, when she noticed sensation of weakness in her right arm as well.  This latter change prompted calling EMS. Daughter notes that the patient had an episode of right arm weakness about 2 weeks ago and was seen and evaluated by her primary care physician.  No report of that evaluation. Patient is elderly, but generally functional, walks with a cane most of the time.  She has no history of prior stroke, does have a history of congestive heart failure. No recent medication change, diet change, activity change.  Past Medical History:  Diagnosis Date   Breast mass    Cervical spondylosis    Dilated cardiomyopathy ([place]) 05/2016   Mild LVH with EF of 20 1125%. Hypokinesis with inferior akinesis. GR 1 DD noted with elevated LVEDP. Moderate LA dilation. Moderate mitral regurgitation. Aortic valve aortic valve calcification/sclerosis without stenosis.   Hypertension     Patient Active Problem List   Diagnosis Date Noted   Goals of care, counseling/discussion    Palliative care by specialist    Acute on chronic systolic heart failure, NYHA class 3 ([place]) 08/07/2016   Acute kidney injury ([place]) 08/07/2016   FTT (failure to thrive) in adult 08/07/2016   Dilated cardiomyopathy ([place]) 07/19/2016   Combined systolic and diastolic congestive heart failure, NYHA class 3 ([place])  07/19/2016   Anemia 06/08/2016   Ventral hernia 09/15/2013   CKD (chronic kidney disease) stage 4, GFR 15-29 ml/min (HCC) 09/15/2013   Acute exacerbation of CHF (congestive heart failure) ([place]) 09/14/2013   Partial bowel obstruction ([place]) 09/13/2013    Past Surgical History:  Procedure Laterality Date   APPENDECTOMY     BACK SURGERY     x 4   CATARACT EXTRACTION     KNEE SURGERY     x 2   TRANSTHORACIC ECHOCARDIOGRAM  05/2016   Mild LVH with EF of 20 1125%. Hypokinesis with inferior akinesis. GR 1 DD noted with elevated LVEDP. Moderate LA dilation. Moderate mitral regurgitation. Aortic valve aortic valve calcification/sclerosis without stenosis.   VENTRAL HERNIA REPAIR N/A 09/14/2013   Procedure: LAPAROSCOPIC ventral HERNIA REPAIR with mesh ;  Surgeon: Shann Medal, MD;  Location: [facility] ORS;  Service: General;  Laterality: N/A;     OB History   No obstetric history on file.     Family History  Problem Relation Age of Onset   Diabetes Mother    Hypertension Mother    Cancer Father        uknown kind of cancer   Diabetes Sister     Social History   Tobacco Use   Smoking status: Former Smoker    Types: Cigarettes   Smokeless tobacco: Never Used  Substance Use Topics   Alcohol use: No   Drug use: No    Home Medications Prior to Admission  medications   Medication Sig Start Date End Date Taking? Authorizing Provider  acetaminophen (TYLENOL) 325 MG tablet Take 650 mg by mouth every 6 (six) hours as needed for mild pain.    [provider]  allopurinol (ZYLOPRIM) 100 MG tablet Take 100 mg by mouth daily.  09/26/13   [provider]  aspirin EC 81 MG tablet Take 81 mg by mouth every morning.    [provider]  carvedilol (COREG) 6.25 MG tablet Take 1 tablet (6.25 mg total) by mouth 2 (two) times daily with a meal. Please schedule an appointment for further refills 12/19/17   Troy Sine, MD  ferrous sulfate 325 (65 FE) MG  tablet Take 1 tablet (325 mg total) by mouth 3 (three) times daily with meals. 06/11/16   Lavina Hamman, MD  furosemide (LASIX) 40 MG tablet Take 1 tablet (40 mg total) by mouth 2 (two) times daily. Take 40 mg in the morning and 20 mg in afternoon, may take extra 20 mg dose in the afternoon as needed 08/09/16   Thurnell Lose, MD  furosemide (LASIX) 40 MG tablet PLEASE SEE ATTACHED FOR DETAILED DIRECTIONS. Please schedule appointment for refills. 10/03/17   Leonie Man, MD  hydrALAZINE (APRESOLINE) 25 MG tablet TAKE 1 TABLET (25 MG TOTAL) BY MOUTH 2 (TWO) TIMES DAILY. NEED OV. 02/14/18   Leonie Man, MD  isosorbide mononitrate (IMDUR) 30 MG 24 hr tablet TAKE 1 TABLET BY MOUTH EVERY DAY 01/04/18   Leonie Man, MD  levalbuterol [facility] HFA) 45 MCG/ACT inhaler Inhale 1 puff into the lungs every 4 (four) hours as needed for wheezing.    [provider]  metolazone (ZAROXOLYN) 2.5 MG tablet Take 1 tablet (2.5 mg total) by mouth 2 (two) times a week. 11/15/16 02/13/17  Leonie Man, MD  metolazone (ZAROXOLYN) 2.5 MG tablet Take 1 tablet (2.5 mg total) by mouth 2 (two) times a week. <PLEASE MAKE APPOINTMENT FOR REFILLS> 12/16/16   Kilroy, Doreene Burke, PA-C  Multiple Vitamins-Minerals (PRESERVISION AREDS PO) Take 1 capsule by mouth daily.     [provider]  NONFORMULARY OR COMPOUNDED [place] compound:  Peripheral Neuropathy cream - Bupivacaine 1%, Doxepin 3%, Gabapentin 6%, Pentoxifylline 3%, Topiramate 1%, dispense 120gram apply 102 grams to affected area 3-4 times daily, +3refills. 07/01/15   Hyatt, Max T, DPM  potassium chloride 20 MEQ TBCR Take 20 mEq by mouth daily. 08/09/16   Thurnell Lose, MD  Probiotic Product (ALIGN PO) Take 1 capsule by mouth daily.    [provider]    Allergies    Patient has no known allergies.  Review of Systems   Review of Systems  Constitutional:       Per HPI, otherwise negative  HENT:       Per HPI, otherwise  negative  Respiratory:       Per HPI, otherwise negative  Cardiovascular:       Per HPI, otherwise negative  Gastrointestinal: Negative for vomiting.  Endocrine:       Negative aside from HPI  Genitourinary:       Neg aside from HPI   Musculoskeletal:       Per HPI, otherwise negative  Skin: Negative.   Neurological: Positive for weakness and numbness. Negative for syncope.    Physical Exam Updated Vital Signs BP (!) 169/82 (BP Location: Right Arm)    Pulse 75    Temp 97.7 F (36.5 C) (Oral)  Resp (!) 22    SpO2 100%   Physical Exam Vitals and nursing note reviewed.  Constitutional:      General: She is not in acute distress.    Appearance: She is well-developed.  HENT:     Head: Normocephalic and atraumatic.  Eyes:     Conjunctiva/sclera: Conjunctivae normal.  Cardiovascular:     Rate and Rhythm: Normal rate and regular rhythm.  Pulmonary:     Effort: Pulmonary effort is normal. No respiratory distress.     Breath sounds: Normal breath sounds. No stridor.  Abdominal:     General: There is no distension.  Skin:    General: Skin is warm and dry.  Neurological:     Mental Status: She is alert and oriented to person, place, and time.     Cranial Nerves: No cranial nerve deficit.     Motor: Tremor and atrophy present. No pronator drift.     Coordination: Coordination normal.     Comments: No facial asymmetry, patient elevates both brows symmetrically, no extraocular motion abnormalities, no gross dysphagia.  There is atrophy that is age-appropriate, but no asymmetry of strength, proximal, distal, upper, lower.     ED Results / Procedures / Treatments   Labs (all labs ordered are listed, but only abnormal results are displayed) Labs Reviewed  CBC - Abnormal; Notable for the following components:      Result Value   RBC 3.84 (*)    Hemoglobin 11.6 (*)    HCT 35.4 (*)    All other components within normal limits  COMPREHENSIVE METABOLIC PANEL - Abnormal; Notable  for the following components:   Glucose, Bld 131 (*)    BUN 60 (*)    Creatinine, Ser 2.54 (*)    GFR calc non Af Amer 16 (*)    GFR calc Af Amer 18 (*)    Anion gap 16 (*)    All other components within normal limits  I-STAT CHEM 8, ED - Abnormal; Notable for the following components:   BUN 60 (*)    Creatinine, Ser 2.80 (*)    Glucose, Bld 128 (*)    Calcium, Ion 1.05 (*)    All other components within normal limits  PROTIME-INR  APTT  DIFFERENTIAL  RAPID URINE DRUG SCREEN, HOSP PERFORMED    EKG EKG Interpretation  Date/Time:  Sunday September 16 2019 13:18:53 EDT Ventricular Rate:  69 PR Interval:    QRS Duration: 120 QT Interval:  440 QTC Calculation: 472 R Axis:   -32 Text Interpretation: Sinus rhythm Nonspecific intraventricular conduction delay Nonspecific T abnormalities, lateral leads Artifact Abnormal ECG Confirmed by Carmin Muskrat [phone]) on 09/16/2019 1:22:14 PM   Radiology MR BRAIN WO CONTRAST  Result Date: 09/16/2019 CLINICAL DATA:  Gait abnormality. Recent fall. Balance disturbance. EXAM: MRI HEAD WITHOUT CONTRAST TECHNIQUE: Multiplanar, multiecho pulse sequences of the brain and surrounding structures were obtained without intravenous contrast. COMPARISON:  Head CT same day.  MRI 09/18/2010. FINDINGS: Brain: Diffusion imaging does not show any acute or subacute infarction. Chronic small-vessel ischemic changes affect the pons. Old small vessel cerebellar infarction on the right. Cerebral hemispheres show chronic small-vessel ischemic changes throughout the deep and subcortical white matter. Old small vessel infarction left thalamus. Old cortical infarctions in the right frontal lobe and left posterior parietal lobe. No mass, recent hemorrhage, hydrocephalus or extra-axial collection. Some hemosiderin deposition in the region of the old cortical infarctions. Vascular: Major vessels at the base of the brain show flow. Skull  and upper cervical spine: Negative  Sinuses/Orbits: Clear/normal Other: None IMPRESSION: No acute or subacute finding. Extensive chronic small-vessel ischemic changes throughout the brain, progressive since 2012. Old right frontal and left posterior parietal cortical and subcortical infarctions, not present in 2012. Electronically Signed   By: Nelson Chimes M.D.   On: 09/16/2019 14:44    Procedures Procedures (including critical care time)  Medications Ordered in ED Medications  sodium chloride 0.9 % bolus 500 mL (500 mLs Intravenous New Bag/Given 09/16/19 1344)    Followed by  0.9 %  sodium chloride infusion (has no administration in time range)    ED Course  I have reviewed the triage vital signs and the nursing notes.  Pertinent labs & imaging results that were available during my care of the patient were reviewed by me and considered in my medical decision making (see chart for details).     With consideration of stroke versus infection versus electrolyte abnormalities versus dehydration patient had labs, MRI, fluids ordered after initial evaluation. MDM Rules/Calculators/A&P                      4:28 PM Patient awake, alert, in no distress, speaking clearly.  With her daughter present we discussed all findings with an MRI which is reassuring. Patient does have chronic small vessel disease, but otherwise no evidence for stroke, and on repeat exam is speaking clearly, hemodynamically is unremarkable. Reviewing the patient's chart prior to MRI results, is clear the patient has had prior MRI with findings concerning for possible 3rd nerve palsy, suggesting possible similar weakness several years ago.  She has also been evaluated for episodic right sided weakness with her physician. Given today's reassuring results, aside from some demonstration for persistent dehydration, CKD, the patient is amenable to, appropriate for discharge with follow-up, as scheduled for tomorrow with her nephrologist and with primary care later in the  week.   MDM Number of Diagnoses or Management Options Weakness: new, needed workup   Amount and/or Complexity of Data Reviewed Clinical lab tests: reviewed Tests in the radiology section of CPT: reviewed Tests in the medicine section of CPT: reviewed Discussion of test results with the performing providers: yes Decide to obtain previous medical records or to obtain history from someone other than the patient: yes Obtain history from someone other than the patient: yes Review and summarize past medical records: yes Independent visualization of images, tracings, or specimens: yes  Risk of Complications, Morbidity, and/or Mortality Presenting problems: high Diagnostic procedures: high Management options: high   Final Clinical Impression(s) / ED Diagnoses Final diagnoses:  Weakness     Carmin Muskrat, MD 09/16/19 1630

## 2019-09-16 NOTE — ED Triage Notes (Signed)
Pt here from home for R side facial pain x2 days, per pt it feels like a headache. Pt has sensation and no facial droop, R grip strength feels weaker than L. Pt says she's noticed feeling weaker on the R side. AOx4, vss

## 2019-09-16 NOTE — Discharge Instructions (Addendum)
As discussed, your evaluation today has been largely reassuring.  But, it is important that you monitor your condition carefully, and do not hesitate to return to the ED if you develop new, or concerning changes in your condition. ? ?Otherwise, please follow-up with your physician for appropriate ongoing care. ? ?

## 2020-07-02 ENCOUNTER — Other Ambulatory Visit: Payer: Self-pay | Admitting: Internal Medicine

## 2020-07-04 ENCOUNTER — Other Ambulatory Visit: Payer: Self-pay | Admitting: Internal Medicine

## 2020-07-04 DIAGNOSIS — I10 Essential (primary) hypertension: Secondary | ICD-10-CM

## 2020-07-04 DIAGNOSIS — N183 Chronic kidney disease, stage 3 unspecified: Secondary | ICD-10-CM

## 2020-07-04 DIAGNOSIS — I739 Peripheral vascular disease, unspecified: Secondary | ICD-10-CM

## 2020-07-08 ENCOUNTER — Other Ambulatory Visit: Payer: Self-pay | Admitting: Internal Medicine

## 2020-07-08 ENCOUNTER — Ambulatory Visit
Admission: RE | Admit: 2020-07-08 | Discharge: 2020-07-08 | Disposition: A | Discharge status: Home / Self Care | Payer: Medicare PPO | Source: Ambulatory Visit | Attending: Internal Medicine | Admitting: Internal Medicine

## 2020-07-08 DIAGNOSIS — I10 Essential (primary) hypertension: Secondary | ICD-10-CM

## 2020-07-08 DIAGNOSIS — N183 Chronic kidney disease, stage 3 unspecified: Secondary | ICD-10-CM

## 2020-07-08 DIAGNOSIS — I739 Peripheral vascular disease, unspecified: Secondary | ICD-10-CM

## 2020-09-23 ENCOUNTER — Observation Stay (HOSPITAL_COMMUNITY)
Admission: EM | Admit: 2020-09-23 | Discharge: 2020-09-25 | Disposition: A | Discharge status: Home / Self Care | Payer: Medicare PPO | Attending: Internal Medicine | Admitting: Internal Medicine

## 2020-09-23 ENCOUNTER — Encounter (HOSPITAL_COMMUNITY): Payer: Self-pay | Admitting: Emergency Medicine

## 2020-09-23 ENCOUNTER — Emergency Department (HOSPITAL_COMMUNITY): Payer: Medicare PPO

## 2020-09-23 ENCOUNTER — Other Ambulatory Visit: Payer: Self-pay

## 2020-09-23 DIAGNOSIS — Z20822 Contact with and (suspected) exposure to covid-19: Secondary | ICD-10-CM | POA: Insufficient documentation

## 2020-09-23 DIAGNOSIS — Z7982 Long term (current) use of aspirin: Secondary | ICD-10-CM | POA: Insufficient documentation

## 2020-09-23 DIAGNOSIS — I5040 Unspecified combined systolic (congestive) and diastolic (congestive) heart failure: Secondary | ICD-10-CM | POA: Diagnosis present

## 2020-09-23 DIAGNOSIS — Z87891 Personal history of nicotine dependence: Secondary | ICD-10-CM | POA: Diagnosis not present

## 2020-09-23 DIAGNOSIS — I130 Hypertensive heart and chronic kidney disease with heart failure and stage 1 through stage 4 chronic kidney disease, or unspecified chronic kidney disease: Secondary | ICD-10-CM | POA: Diagnosis not present

## 2020-09-23 DIAGNOSIS — R4781 Slurred speech: Secondary | ICD-10-CM | POA: Diagnosis present

## 2020-09-23 DIAGNOSIS — E875 Hyperkalemia: Secondary | ICD-10-CM | POA: Diagnosis not present

## 2020-09-23 DIAGNOSIS — N184 Chronic kidney disease, stage 4 (severe): Secondary | ICD-10-CM | POA: Diagnosis not present

## 2020-09-23 DIAGNOSIS — M6281 Muscle weakness (generalized): Secondary | ICD-10-CM | POA: Insufficient documentation

## 2020-09-23 DIAGNOSIS — I5042 Chronic combined systolic (congestive) and diastolic (congestive) heart failure: Secondary | ICD-10-CM | POA: Insufficient documentation

## 2020-09-23 DIAGNOSIS — Z79899 Other long term (current) drug therapy: Secondary | ICD-10-CM | POA: Insufficient documentation

## 2020-09-23 DIAGNOSIS — R627 Adult failure to thrive: Secondary | ICD-10-CM | POA: Diagnosis present

## 2020-09-23 DIAGNOSIS — R2681 Unsteadiness on feet: Secondary | ICD-10-CM | POA: Diagnosis not present

## 2020-09-23 DIAGNOSIS — I639 Cerebral infarction, unspecified: Principal | ICD-10-CM | POA: Insufficient documentation

## 2020-09-23 DIAGNOSIS — R471 Dysarthria and anarthria: Secondary | ICD-10-CM

## 2020-09-23 DIAGNOSIS — I10 Essential (primary) hypertension: Secondary | ICD-10-CM | POA: Diagnosis not present

## 2020-09-23 LAB — DIFFERENTIAL
Abs Immature Granulocytes: 0.04 10*3/uL (ref 0.00–0.07)
Basophils Absolute: 0 10*3/uL (ref 0.0–0.1)
Basophils Relative: 0 %
Eosinophils Absolute: 0.2 10*3/uL (ref 0.0–0.5)
Eosinophils Relative: 3 %
Immature Granulocytes: 1 %
Lymphocytes Relative: 20 %
Lymphs Abs: 1.5 10*3/uL (ref 0.7–4.0)
Monocytes Absolute: 0.6 10*3/uL (ref 0.1–1.0)
Monocytes Relative: 9 %
Neutro Abs: 5 10*3/uL (ref 1.7–7.7)
Neutrophils Relative %: 67 %

## 2020-09-23 LAB — CBC
HCT: 31 % — ABNORMAL LOW (ref 36.0–46.0)
Hemoglobin: 9.9 g/dL — ABNORMAL LOW (ref 12.0–15.0)
MCH: 30.6 pg (ref 26.0–34.0)
MCHC: 31.9 g/dL (ref 30.0–36.0)
MCV: 95.7 fL (ref 80.0–100.0)
Platelets: 155 10*3/uL (ref 150–400)
RBC: 3.24 MIL/uL — ABNORMAL LOW (ref 3.87–5.11)
RDW: 15.9 % — ABNORMAL HIGH (ref 11.5–15.5)
WBC: 7.3 10*3/uL (ref 4.0–10.5)
nRBC: 0 % (ref 0.0–0.2)

## 2020-09-23 LAB — I-STAT CHEM 8, ED
BUN: 72 mg/dL — ABNORMAL HIGH (ref 8–23)
Calcium, Ion: 1.07 mmol/L — ABNORMAL LOW (ref 1.15–1.40)
Chloride: 102 mmol/L (ref 98–111)
Creatinine, Ser: 3.4 mg/dL — ABNORMAL HIGH (ref 0.44–1.00)
Glucose, Bld: 159 mg/dL — ABNORMAL HIGH (ref 70–99)
HCT: 33 % — ABNORMAL LOW (ref 36.0–46.0)
Hemoglobin: 11.2 g/dL — ABNORMAL LOW (ref 12.0–15.0)
Potassium: 5.7 mmol/L — ABNORMAL HIGH (ref 3.5–5.1)
Sodium: 136 mmol/L (ref 135–145)
TCO2: 27 mmol/L (ref 22–32)

## 2020-09-23 LAB — COMPREHENSIVE METABOLIC PANEL
ALT: 10 U/L (ref 0–44)
AST: 18 U/L (ref 15–41)
Albumin: 3.4 g/dL — ABNORMAL LOW (ref 3.5–5.0)
Alkaline Phosphatase: 82 U/L (ref 38–126)
Anion gap: 9 (ref 5–15)
BUN: 78 mg/dL — ABNORMAL HIGH (ref 8–23)
CO2: 27 mmol/L (ref 22–32)
Calcium: 8.3 mg/dL — ABNORMAL LOW (ref 8.9–10.3)
Chloride: 100 mmol/L (ref 98–111)
Creatinine, Ser: 3.38 mg/dL — ABNORMAL HIGH (ref 0.44–1.00)
GFR, Estimated: 12 mL/min — ABNORMAL LOW (ref >60)
Glucose, Bld: 163 mg/dL — ABNORMAL HIGH (ref 70–99)
Potassium: 5.9 mmol/L — ABNORMAL HIGH (ref 3.5–5.1)
Sodium: 136 mmol/L (ref 135–145)
Total Bilirubin: 0.3 mg/dL (ref 0.3–1.2)
Total Protein: 7 g/dL (ref 6.5–8.1)

## 2020-09-23 LAB — PROTIME-INR
INR: 1.1 (ref 0.8–1.2)
Prothrombin Time: 14.6 seconds (ref 11.4–15.2)

## 2020-09-23 LAB — BRAIN NATRIURETIC PEPTIDE: B Natriuretic Peptide: 195.9 pg/mL — ABNORMAL HIGH (ref 0.0–100.0)

## 2020-09-23 LAB — RESP PANEL BY RT-PCR (FLU A&B, COVID) ARPGX2
Influenza A by PCR: NEGATIVE
Influenza B by PCR: NEGATIVE
SARS Coronavirus 2 by RT PCR: NEGATIVE

## 2020-09-23 LAB — CBG MONITORING, ED: Glucose-Capillary: 139 mg/dL — ABNORMAL HIGH (ref 70–99)

## 2020-09-23 LAB — TROPONIN I (HIGH SENSITIVITY)
Troponin I (High Sensitivity): 11 ng/L (ref <18)
Troponin I (High Sensitivity): 13 ng/L (ref <18)

## 2020-09-23 LAB — APTT: aPTT: 27 seconds (ref 24–36)

## 2020-09-23 MED ORDER — FUROSEMIDE 10 MG/ML IJ SOLN
40.0000 mg | Freq: Once | INTRAMUSCULAR | Status: AC
  Administered 2020-09-23: 40 mg via INTRAVENOUS
  Filled 2020-09-23: qty 4

## 2020-09-23 MED ORDER — SENNOSIDES-DOCUSATE SODIUM 8.6-50 MG PO TABS: 1.0000 | ORAL_TABLET | Freq: Every evening | ORAL | Status: DC | PRN

## 2020-09-23 MED ORDER — STROKE: EARLY STAGES OF RECOVERY BOOK
Freq: Once | Status: AC
  Filled 2020-09-23: qty 1

## 2020-09-23 MED ORDER — HEPARIN SODIUM (PORCINE) 5000 UNIT/ML IJ SOLN
5000.0000 [IU] | Freq: Three times a day (TID) | INTRAMUSCULAR | Status: DC
  Administered 2020-09-23 – 2020-09-25 (×6): 5000 [IU] via SUBCUTANEOUS
  Filled 2020-09-23 (×6): qty 1

## 2020-09-23 MED ORDER — ASPIRIN 325 MG PO TABS
325.0000 mg | ORAL_TABLET | Freq: Every day | ORAL | Status: DC
  Administered 2020-09-24 – 2020-09-25 (×2): 325 mg via ORAL
  Filled 2020-09-23 (×2): qty 1

## 2020-09-23 MED ORDER — ACETAMINOPHEN 650 MG RE SUPP: 650.0000 mg | RECTAL | Status: DC | PRN

## 2020-09-23 MED ORDER — ASPIRIN 300 MG RE SUPP: 300.0000 mg | Freq: Every day | RECTAL | Status: DC

## 2020-09-23 MED ORDER — ACETAMINOPHEN 325 MG PO TABS
650.0000 mg | ORAL_TABLET | ORAL | Status: DC | PRN
  Administered 2020-09-24 (×3): 650 mg via ORAL
  Filled 2020-09-23 (×3): qty 2

## 2020-09-23 MED ORDER — ATORVASTATIN CALCIUM 40 MG PO TABS
40.0000 mg | ORAL_TABLET | Freq: Every day | ORAL | Status: DC
  Administered 2020-09-24 – 2020-09-25 (×2): 40 mg via ORAL
  Filled 2020-09-23 (×2): qty 1

## 2020-09-23 MED ORDER — SODIUM CHLORIDE 0.9% FLUSH
3.0000 mL | Freq: Once | INTRAVENOUS | Status: AC
  Administered 2020-09-23: 3 mL via INTRAVENOUS

## 2020-09-23 MED ORDER — ACETAMINOPHEN 160 MG/5ML PO SOLN: 650.0000 mg | ORAL | Status: DC | PRN

## 2020-09-23 NOTE — ED Notes (Signed)
Attempted to give report 

## 2020-09-23 NOTE — ED Notes (Signed)
Contacted [facility]lab for update on Covid swab. Lab tech was unable to find swab. Re-swab pt and walked swab to lab.

## 2020-09-23 NOTE — H&P (Signed)
History and Physical   [name] [id number] DOB: 23-Feb-1924 DOA: 09/23/2020  Referring MD/NP/PA: Dr. Nanda Quinton  PCP: Troy Sine, MD   Outpatient Specialists: None  Patient coming from: Home  Chief Complaint: Dysarthria  HPI: [name] is a 87 y.o. female with medical history significant of systolic dysfunction CHF with unknown EF of 20 to 25% previously, hypertension, chronic kidney disease stage III, dilated cardiomyopathy, cervical spondylosis, history of breast mass, chronic back pain and ventral hernia with previous repair who presents to the ER with acute onset of dysarthria.  Patient was on the phone with her son and started having slurred speech.  This is new for her.  Most recent illness.  No headache no chest pain.  Denied any focal weakness.  Patient was brought to the ER.  She came in outside the window for tPA administration.  Symptoms are improved.  Initial head CT without contrast is negative.  Following the code stroke she is being admitted to the hospital for further evaluation.  She denied any prior strokes.  She has risk factors for CVA as indicated.  She will be admitted for CVA work-up per neurology..  ED Course: Temperature is 98.3 blood pressure 191/82, pulse 79, respirate of 20 oxygen sat 99% on room air.  COVID-19 screen is negative.  BNP of 195.  EKG showed no significant findings.  Chest x-ray showed no significant findings.  Head CT without contrast showed no significant findings.  Lab work showed sodium of 136 potassium 5.9 chloride 100 CO2 27 glucose is 163.  BUN 78 creatinine 3.38 calcium 8.3 and gap of 9.  CBC within normal.  Neurology has seen patient with a code stroke and recommends admission for work-up.  Review of Systems: As per HPI otherwise 10 point review of systems negative.    Past Medical History:  Diagnosis Date   Breast mass    Cervical spondylosis    Dilated cardiomyopathy ([place]) 05/2016   Mild LVH with EF of 20 1125%.  Hypokinesis with inferior akinesis. GR 1 DD noted with elevated LVEDP. Moderate LA dilation. Moderate mitral regurgitation. Aortic valve aortic valve calcification/sclerosis without stenosis.   Hypertension     Past Surgical History:  Procedure Laterality Date   APPENDECTOMY     BACK SURGERY     x 4   CATARACT EXTRACTION     KNEE SURGERY     x 2   TRANSTHORACIC ECHOCARDIOGRAM  05/2016   Mild LVH with EF of 20 1125%. Hypokinesis with inferior akinesis. GR 1 DD noted with elevated LVEDP. Moderate LA dilation. Moderate mitral regurgitation. Aortic valve aortic valve calcification/sclerosis without stenosis.   VENTRAL HERNIA REPAIR N/A 09/14/2013   Procedure: LAPAROSCOPIC ventral HERNIA REPAIR with mesh ;  Surgeon: Shann Medal, MD;  Location: [facility];  Service: General;  Laterality: N/A;     reports that she has quit smoking. Her smoking use included cigarettes. She has never used smokeless tobacco. She reports that she does not drink alcohol and does not use drugs.  No Known Allergies  Family History  Problem Relation Age of Onset   Diabetes Mother    Hypertension Mother    Cancer Father        uknown kind of cancer   Diabetes Sister      Prior to Admission medications   Medication Sig Start Date End Date Taking? Authorizing Provider  acetaminophen (TYLENOL) 325 MG tablet Take 650 mg by mouth every 6 (six) hours as  needed for mild pain.    [provider]  allopurinol (ZYLOPRIM) 100 MG tablet Take 100 mg by mouth daily.  09/26/13   [provider]  aspirin EC 81 MG tablet Take 81 mg by mouth every morning.    [provider]  carvedilol (COREG) 6.25 MG tablet Take 1 tablet (6.25 mg total) by mouth 2 (two) times daily with a meal. Please schedule an appointment for further refills 12/19/17   Troy Sine, MD  ferrous sulfate 325 (65 FE) MG tablet Take 1 tablet (325 mg total) by mouth 3 (three) times daily with meals. 06/11/16   Lavina Hamman, MD   furosemide (LASIX) 40 MG tablet Take 1 tablet (40 mg total) by mouth 2 (two) times daily. Take 40 mg in the morning and 20 mg in afternoon, may take extra 20 mg dose in the afternoon as needed 08/09/16   Thurnell Lose, MD  furosemide (LASIX) 40 MG tablet PLEASE SEE ATTACHED FOR DETAILED DIRECTIONS. Please schedule appointment for refills. 10/03/17   Leonie Man, MD  hydrALAZINE (APRESOLINE) 25 MG tablet TAKE 1 TABLET (25 MG TOTAL) BY MOUTH 2 (TWO) TIMES DAILY. NEED OV. 02/14/18   Leonie Man, MD  isosorbide mononitrate (IMDUR) 30 MG 24 hr tablet TAKE 1 TABLET BY MOUTH EVERY DAY 01/04/18   Leonie Man, MD  levalbuterol [facility] HFA) 45 MCG/ACT inhaler Inhale 1 puff into the lungs every 4 (four) hours as needed for wheezing.    [provider]  metolazone (ZAROXOLYN) 2.5 MG tablet Take 1 tablet (2.5 mg total) by mouth 2 (two) times a week. 11/15/16 02/13/17  Leonie Man, MD  metolazone (ZAROXOLYN) 2.5 MG tablet Take 1 tablet (2.5 mg total) by mouth 2 (two) times a week. <PLEASE MAKE APPOINTMENT FOR REFILLS> 12/16/16   Kilroy, Doreene Burke, PA-C  Multiple Vitamins-Minerals (PRESERVISION AREDS PO) Take 1 capsule by mouth daily.     [provider]  NONFORMULARY OR COMPOUNDED Dovray compound:  Peripheral Neuropathy cream - Bupivacaine 1%, Doxepin 3%, Gabapentin 6%, Pentoxifylline 3%, Topiramate 1%, dispense 120gram apply 102 grams to affected area 3-4 times daily, +3refills. 07/01/15   Hyatt, Max T, DPM  potassium chloride 20 MEQ TBCR Take 20 mEq by mouth daily. 08/09/16   Thurnell Lose, MD  Probiotic Product (ALIGN PO) Take 1 capsule by mouth daily.    [provider]    Physical Exam: Vitals:   09/23/20 1900 09/23/20 1915 09/23/20 1935 09/23/20 2000  BP: (!) 147/83 (!) 144/132 (!) 162/85 (!) 175/89  Pulse: 64 62 64 63  Resp: '19 18 12 18  '$ Temp:      TempSrc:      SpO2: 100% 100% 100% 100%  Weight:          Constitutional: Chronically ill  looking weak debilitated Vitals:   09/23/20 1900 09/23/20 1915 09/23/20 1935 09/23/20 2000  BP: (!) 147/83 (!) 144/132 (!) 162/85 (!) 175/89  Pulse: 64 62 64 63  Resp: '19 18 12 18  '$ Temp:      TempSrc:      SpO2: 100% 100% 100% 100%  Weight:       Eyes: PERRL, lids and conjunctivae normal ENMT: Mucous membranes are moist. Posterior pharynx clear of any exudate or lesions.Normal dentition.  Neck: normal, supple, no masses, no thyromegaly Respiratory: clear to auscultation bilaterally, no wheezing, no crackles. Normal respiratory effort. No accessory muscle use.  Cardiovascular: Regular rate and rhythm, systolic ejection  murmur/ rubs / gallops. No extremity edema. 2+ pedal pulses. No carotid bruits.  Abdomen: no tenderness, no masses palpated. No hepatosplenomegaly. Bowel sounds positive.  Musculoskeletal: no clubbing / cyanosis. No joint deformity upper and lower extremities. Good ROM, no contractures. Normal muscle tone.  Skin: no rashes, lesions, ulcers. No induration Neurologic: CN 2-12 grossly intact. Sensation intact, DTR normal. Strength 5/5 in all 4.  Mildly dysarthric Psychiatric: Normal judgment and insight. Alert and oriented x 3. Normal mood.     Labs on Admission: I have personally reviewed following labs and imaging studies  CBC: Recent Labs  Lab 09/23/20 1540 09/23/20 1546  WBC 7.3  --   NEUTROABS 5.0  --   HGB 9.9* 11.2*  HCT 31.0* 33.0*  MCV 95.7  --   PLT 155  --    Basic Metabolic Panel: Recent Labs  Lab 09/23/20 1540 09/23/20 1546  NA 136 136  K 5.9* 5.7*  CL 100 102  CO2 27  --   GLUCOSE 163* 159*  BUN 78* 72*  CREATININE 3.38* 3.40*  CALCIUM 8.3*  --    GFR: CrCl cannot be calculated (Unknown ideal weight.). Liver Function Tests: Recent Labs  Lab 09/23/20 1540  AST 18  ALT 10  ALKPHOS 82  BILITOT 0.3  PROT 7.0  ALBUMIN 3.4*   No results for input(s): LIPASE, AMYLASE in the last 168 hours. No results for input(s): AMMONIA in the last  168 hours. Coagulation Profile: Recent Labs  Lab 09/23/20 1540  INR 1.1   Cardiac Enzymes: No results for input(s): CKTOTAL, CKMB, CKMBINDEX, TROPONINI in the last 168 hours. BNP (last 3 results) No results for input(s): PROBNP in the last 8760 hours. HbA1C: No results for input(s): HGBA1C in the last 72 hours. CBG: Recent Labs  Lab 09/23/20 1539  GLUCAP 139*   Lipid Profile: No results for input(s): CHOL, HDL, LDLCALC, TRIG, CHOLHDL, LDLDIRECT in the last 72 hours. Thyroid Function Tests: No results for input(s): TSH, T4TOTAL, FREET4, T3FREE, THYROIDAB in the last 72 hours. Anemia Panel: No results for input(s): VITAMINB12, FOLATE, FERRITIN, TIBC, IRON, RETICCTPCT in the last 72 hours. Urine analysis:    Component Value Date/Time   COLORURINE STRAW (A) 08/07/2016 1932   APPEARANCEUR CLEAR 08/07/2016 1932   LABSPEC 1.008 08/07/2016 1932   PHURINE 5.0 08/07/2016 1932   GLUCOSEU NEGATIVE 08/07/2016 1932   HGBUR NEGATIVE 08/07/2016 1932   BILIRUBINUR NEGATIVE 08/07/2016 1932   KETONESUR NEGATIVE 08/07/2016 1932   PROTEINUR NEGATIVE 08/07/2016 1932   UROBILINOGEN 0.2 09/13/2013 1633   NITRITE NEGATIVE 08/07/2016 1932   LEUKOCYTESUR NEGATIVE 08/07/2016 1932   Sepsis Labs: '@LABRCNTIP'$ (procalcitonin:4,lacticidven:4) )No results found for this or any previous visit (from the past 240 hour(s)).   Radiological Exams on Admission: DG Chest Portable 1 View  Result Date: 09/23/2020 CLINICAL DATA:  Code stroke.  New slurred speech. EXAM: PORTABLE CHEST 1 VIEW COMPARISON:  08/08/2016 FINDINGS: Heart size and pulmonary vascularity are normal for technique. Slight infiltration or atelectasis in the left lung base. No pleural effusions. No pneumothorax. Calcified and tortuous aorta. Degenerative changes in the spine and shoulders. IMPRESSION: Slight infiltration or atelectasis in the left base. Electronically Signed   By: Lucienne Capers M.D.   On: 09/23/2020 17:23   CT HEAD CODE  STROKE WO CONTRAST  Result Date: 09/23/2020 CLINICAL DATA:  Code stroke.  Right-sided weakness.  Slurred speech. EXAM: CT HEAD WITHOUT CONTRAST TECHNIQUE: Contiguous axial images were obtained from the base of the skull through the vertex  without intravenous contrast. COMPARISON:  Head MRI 09/16/2019 FINDINGS: Brain: There is no evidence of an acute infarct, intracranial hemorrhage, mass, midline shift, or extra-axial fluid collection. Cerebral atrophy is within normal limits for age. Chronic cortical infarcts are again noted in the right frontal and left occipital lobes, and there is also an unchanged chronic lacunar infarct in the left thalamus. Patchy hypodensities in the cerebral white matter bilaterally are nonspecific but compatible with moderate chronic small vessel ischemic disease. Vascular: Calcified atherosclerosis at the skull base. No hyperdense vessel. Skull: No fracture or suspicious osseous lesion. Sinuses/Orbits: Visualized paranasal sinuses and mastoid air cells are clear. Bilateral cataract extraction. Other: None. ASPECTS [facility] Stroke Program Early CT Score) - Ganglionic level infarction (caudate, lentiform nuclei, internal capsule, insula, M1-M3 cortex): 7 - Supraganglionic infarction (M4-M6 cortex): 3 Total score (0-10 with 10 being normal): 10 IMPRESSION: 1. No evidence of acute intracranial abnormality. 2. ASPECTS is 10. 3. Moderate chronic small vessel ischemic disease with chronic infarcts as above. These results were communicated to Dr. Cheral Marker at 3:55 pm on 09/23/2020 by text page via the [facility] messaging system. Electronically Signed   By: Logan Bores M.D.   On: 09/23/2020 15:55    EKG: Independently reviewed.  Shows sinus rhythm with a rate of 67.  Normal intervals.  No significant ST changes.  Left bundle branch block but unknown duration  Assessment/Plan Principal Problem:   Acute CVA (cerebrovascular accident) ([place]) Active Problems:   CKD (chronic kidney disease) stage 4,  GFR 15-29 ml/min (HCC)   Combined systolic and diastolic congestive heart failure, NYHA class 3 (HCC)   FTT (failure to thrive) in adult   Hyperkalemia     #1 acute CVA: Suspected acute CVA.  Head CT without contrast negative.  Patient still slightly dysarthric.  We will admit the patient for work-up.  We will get MRI of the brain, carotid Dopplers and echocardiogram.  Depending on the results further treatment will be planned.  #2 hyperkalemia: Mild.  Monitoring.  Hold potassium supplementation.  Treated initially in the ER.  We will continue to treat.  #3 chronic kidney disease stage IV: Appears to be at baseline.  Continue to monitor  #4 failure to thrive in adult: Coming from home.  Has heart issues with failure to thrive.  We will continue to monitor.  #5 systolic dysfunction CHF: EF 25 to 30% last known.  Compensated at the moment.  Continue Zaroxolyn, Imdur, hydralazine, Lasix, carvedilol as well as supportive care.   DVT prophylaxis: Heparin Code Status: DNR Family Communication: No family at bedside Disposition Plan: Home Consults called: Dr. Cheral Marker, neurology Admission status: Inpatient  Severity of Illness: The appropriate patient status for this patient is INPATIENT. Inpatient status is judged to be reasonable and necessary in order to provide the required intensity of service to ensure the patient's safety. The patient's presenting symptoms, physical exam findings, and initial radiographic and laboratory data in the context of their chronic comorbidities is felt to place them at high risk for further clinical deterioration. Furthermore, it is not anticipated that the patient will be medically stable for discharge from the hospital within 2 midnights of admission. The following factors support the patient status of inpatient.   " The patient's presenting symptoms include dysarthria. " The worrisome physical exam findings include speech changes. " The initial radiographic  and laboratory data are worrisome because of no acute radiology changes. " The chronic co-morbidities include systolic dysfunction CHF.   * I certify that at  the point of admission it is my clinical judgment that the patient will require inpatient hospital care spanning beyond 2 midnights from the point of admission due to high intensity of service, high risk for further deterioration and high frequency of surveillance required.Barbette Merino MD [facility] Pager [phone]  If 7PM-7AM, please contact night-coverage [email or web address] Password [facility]  09/23/2020, 8:29 PM

## 2020-09-23 NOTE — ED Provider Notes (Signed)
[facility] EMERGENCY DEPARTMENT Provider Note   CSN: [id number] Arrival date & time: 09/23/20  1536  An emergency department physician performed an initial assessment on this suspected stroke patient at 1540.  History Chief Complaint  Patient presents with   Code Stroke    [name] is a 87 y.o. female with a past medical history of CHF, CKD, hypertension, who presents today for evaluation of slurred speech. History is primarily obtained from chart review, discussions with patient and family at bedside. Patient was on the phone with her son today at 1430 and started slurring her speech.  She was well prior to that and denies any recent sickness. Patient denies any pain in her head, chest, neck abdomen.  She does have chronic knee and back pain that is unchanged.  Level 5 caveat for acuity of condition, dysarthria.   When I first attempted to examine patient she was in the CT scanner in care of stroke team.   HPI     Past Medical History:  Diagnosis Date   Breast mass    Cervical spondylosis    Dilated cardiomyopathy ([place]) 05/2016   Mild LVH with EF of 20 1125%. Hypokinesis with inferior akinesis. GR 1 DD noted with elevated LVEDP. Moderate LA dilation. Moderate mitral regurgitation. Aortic valve aortic valve calcification/sclerosis without stenosis.   Hypertension     Patient Active Problem List   Diagnosis Date Noted   Acute CVA (cerebrovascular accident) ([place]) 09/23/2020   Hyperkalemia 09/23/2020   Hypertensive disorder 11/14/2017   Goals of care, counseling/discussion    Palliative care by specialist    Acute on chronic systolic heart failure, NYHA class 3 ([place]) 08/07/2016   Acute kidney injury ([place]) 08/07/2016   FTT (failure to thrive) in adult 08/07/2016   Dilated cardiomyopathy ([place]) 07/19/2016   Combined systolic and diastolic congestive heart failure, NYHA class 3 ([place]) 07/19/2016   Anemia 06/08/2016   Ventral hernia 09/15/2013   CKD  (chronic kidney disease) stage 4, GFR 15-29 ml/min (HCC) 09/15/2013   Acute exacerbation of CHF (congestive heart failure) ([place]) 09/14/2013   Partial bowel obstruction ([place]) 09/13/2013    Past Surgical History:  Procedure Laterality Date   APPENDECTOMY     BACK SURGERY     x 4   CATARACT EXTRACTION     KNEE SURGERY     x 2   TRANSTHORACIC ECHOCARDIOGRAM  05/2016   Mild LVH with EF of 20 1125%. Hypokinesis with inferior akinesis. GR 1 DD noted with elevated LVEDP. Moderate LA dilation. Moderate mitral regurgitation. Aortic valve aortic valve calcification/sclerosis without stenosis.   VENTRAL HERNIA REPAIR N/A 09/14/2013   Procedure: LAPAROSCOPIC ventral HERNIA REPAIR with mesh ;  Surgeon: Shann Medal, MD;  Location: [facility];  Service: General;  Laterality: N/A;     OB History   No obstetric history on file.     Family History  Problem Relation Age of Onset   Diabetes Mother    Hypertension Mother    Cancer Father        uknown kind of cancer   Diabetes Sister     Social History   Tobacco Use   Smoking status: Former    Pack years: 0.00    Types: Cigarettes   Smokeless tobacco: Never  Substance Use Topics   Alcohol use: No   Drug use: No    Home Medications Prior to Admission medications   Medication Sig Start Date End Date Taking? Authorizing Provider  acetaminophen (TYLENOL) 325 MG tablet Take 650 mg by mouth every 6 (six) hours as needed for mild pain.    [provider]  allopurinol (ZYLOPRIM) 100 MG tablet Take 100 mg by mouth daily.  09/26/13   [provider]  aspirin EC 81 MG tablet Take 81 mg by mouth every morning.    [provider]  carvedilol (COREG) 6.25 MG tablet Take 1 tablet (6.25 mg total) by mouth 2 (two) times daily with a meal. Please schedule an appointment for further refills 12/19/17   Troy Sine, MD  ferrous sulfate 325 (65 FE) MG tablet Take 1 tablet (325 mg total) by mouth 3 (three) times daily with meals.  06/11/16   Lavina Hamman, MD  furosemide (LASIX) 40 MG tablet Take 1 tablet (40 mg total) by mouth 2 (two) times daily. Take 40 mg in the morning and 20 mg in afternoon, may take extra 20 mg dose in the afternoon as needed 08/09/16   Thurnell Lose, MD  furosemide (LASIX) 40 MG tablet PLEASE SEE ATTACHED FOR DETAILED DIRECTIONS. Please schedule appointment for refills. 10/03/17   Leonie Man, MD  hydrALAZINE (APRESOLINE) 25 MG tablet TAKE 1 TABLET (25 MG TOTAL) BY MOUTH 2 (TWO) TIMES DAILY. NEED OV. 02/14/18   Leonie Man, MD  isosorbide mononitrate (IMDUR) 30 MG 24 hr tablet TAKE 1 TABLET BY MOUTH EVERY DAY 01/04/18   Leonie Man, MD  levalbuterol [facility] HFA) 45 MCG/ACT inhaler Inhale 1 puff into the lungs every 4 (four) hours as needed for wheezing.    [provider]  metolazone (ZAROXOLYN) 2.5 MG tablet Take 1 tablet (2.5 mg total) by mouth 2 (two) times a week. 11/15/16 02/13/17  Leonie Man, MD  metolazone (ZAROXOLYN) 2.5 MG tablet Take 1 tablet (2.5 mg total) by mouth 2 (two) times a week. <PLEASE MAKE APPOINTMENT FOR REFILLS> 12/16/16   Kilroy, Doreene Burke, PA-C  Multiple Vitamins-Minerals (PRESERVISION AREDS PO) Take 1 capsule by mouth daily.     [provider]  NONFORMULARY OR COMPOUNDED [place] compound:  Peripheral Neuropathy cream - Bupivacaine 1%, Doxepin 3%, Gabapentin 6%, Pentoxifylline 3%, Topiramate 1%, dispense 120gram apply 102 grams to affected area 3-4 times daily, +3refills. 07/01/15   Hyatt, Max T, DPM  potassium chloride 20 MEQ TBCR Take 20 mEq by mouth daily. 08/09/16   Thurnell Lose, MD  Probiotic Product (ALIGN PO) Take 1 capsule by mouth daily.    [provider]    Allergies    Patient has no known allergies.  Review of Systems   Review of Systems  Unable to perform ROS: Acuity of condition   Physical Exam Updated Vital Signs BP (!) 175/89 (BP Location: Left Arm)   Pulse 63   Temp (!) 97.3 F (36.3 C)  (Oral)   Resp 18   Wt 73.2 kg   SpO2 100%   BMI 28.59 kg/m   Physical Exam Vitals and nursing note reviewed.  Constitutional:      General: She is not in acute distress.    Appearance: She is not ill-appearing or diaphoretic.  HENT:     Head: Normocephalic and atraumatic.  Eyes:     General: No scleral icterus.       Right eye: No discharge.        Left eye: No discharge.     Conjunctiva/sclera: Conjunctivae normal.  Cardiovascular:     Rate and Rhythm: Normal rate and regular rhythm.  Heart sounds: Murmur heard.  Pulmonary:     Effort: Pulmonary effort is normal. No respiratory distress.     Breath sounds: Normal breath sounds. No stridor.  Abdominal:     General: There is no distension.     Tenderness: There is no abdominal tenderness. There is no guarding.  Musculoskeletal:        General: No deformity.     Cervical back: Normal range of motion and neck supple.     Right lower leg: Edema present.     Left lower leg: Edema present.     Comments: 1+ pitting edema bilaterally  Skin:    General: Skin is warm and dry.  Neurological:     Mental Status: She is alert.     Motor: No abnormal muscle tone.     Comments: Patient is awake and alert.  She is oriented to person, place, and time. Speech is dysarthric and she has difficulty repeating phrases however is able to answer yes/no questions.  5/5 strength bilateral grip strength.  5/5 strength bilateral ankle dorsiflexion and plantar flexion.  There is no obvious facial droop.  Psychiatric:        Mood and Affect: Mood normal.        Behavior: Behavior normal.    ED Results / Procedures / Treatments   Labs (all labs ordered are listed, but only abnormal results are displayed) Labs Reviewed  CBC - Abnormal; Notable for the following components:      Result Value   RBC 3.24 (*)    Hemoglobin 9.9 (*)    HCT 31.0 (*)    RDW 15.9 (*)    All other components within normal limits  COMPREHENSIVE METABOLIC PANEL -  Abnormal; Notable for the following components:   Potassium 5.9 (*)    Glucose, Bld 163 (*)    BUN 78 (*)    Creatinine, Ser 3.38 (*)    Calcium 8.3 (*)    Albumin 3.4 (*)    GFR, Estimated 12 (*)    All other components within normal limits  I-STAT CHEM 8, ED - Abnormal; Notable for the following components:   Potassium 5.7 (*)    BUN 72 (*)    Creatinine, Ser 3.40 (*)    Glucose, Bld 159 (*)    Calcium, Ion 1.07 (*)    Hemoglobin 11.2 (*)    HCT 33.0 (*)    All other components within normal limits  CBG MONITORING, ED - Abnormal; Notable for the following components:   Glucose-Capillary 139 (*)    All other components within normal limits  RESP PANEL BY RT-PCR (FLU A&B, COVID) ARPGX2  PROTIME-INR  APTT  DIFFERENTIAL  BRAIN NATRIURETIC PEPTIDE  TROPONIN I (HIGH SENSITIVITY)  TROPONIN I (HIGH SENSITIVITY)    EKG EKG Interpretation  Date/Time:  Tuesday September 23 2020 16:30:42 EDT Ventricular Rate:  67 PR Interval:  172 QRS Duration: 122 QT Interval:  453 QTC Calculation: 479 R Axis:   7 Text Interpretation: Sinus rhythm Left bundle branch block LBBB new from prior Confirmed by Nanda Quinton [phone]) on 09/23/2020 4:47:16 PM  Radiology DG Chest Portable 1 View  Result Date: 09/23/2020 CLINICAL DATA:  Code stroke.  New slurred speech. EXAM: PORTABLE CHEST 1 VIEW COMPARISON:  08/08/2016 FINDINGS: Heart size and pulmonary vascularity are normal for technique. Slight infiltration or atelectasis in the left lung base. No pleural effusions. No pneumothorax. Calcified and tortuous aorta. Degenerative changes in the spine and shoulders. IMPRESSION: Slight infiltration  or atelectasis in the left base. Electronically Signed   By: Lucienne Capers M.D.   On: 09/23/2020 17:23   CT HEAD CODE STROKE WO CONTRAST  Result Date: 09/23/2020 CLINICAL DATA:  Code stroke.  Right-sided weakness.  Slurred speech. EXAM: CT HEAD WITHOUT CONTRAST TECHNIQUE: Contiguous axial images were obtained from  the base of the skull through the vertex without intravenous contrast. COMPARISON:  Head MRI 09/16/2019 FINDINGS: Brain: There is no evidence of an acute infarct, intracranial hemorrhage, mass, midline shift, or extra-axial fluid collection. Cerebral atrophy is within normal limits for age. Chronic cortical infarcts are again noted in the right frontal and left occipital lobes, and there is also an unchanged chronic lacunar infarct in the left thalamus. Patchy hypodensities in the cerebral white matter bilaterally are nonspecific but compatible with moderate chronic small vessel ischemic disease. Vascular: Calcified atherosclerosis at the skull base. No hyperdense vessel. Skull: No fracture or suspicious osseous lesion. Sinuses/Orbits: Visualized paranasal sinuses and mastoid air cells are clear. Bilateral cataract extraction. Other: None. ASPECTS [facility] Stroke Program Early CT Score) - Ganglionic level infarction (caudate, lentiform nuclei, internal capsule, insula, M1-M3 cortex): 7 - Supraganglionic infarction (M4-M6 cortex): 3 Total score (0-10 with 10 being normal): 10 IMPRESSION: 1. No evidence of acute intracranial abnormality. 2. ASPECTS is 10. 3. Moderate chronic small vessel ischemic disease with chronic infarcts as above. These results were communicated to Dr. Cheral Marker at 3:55 pm on 09/23/2020 by text page via the [facility] messaging system. Electronically Signed   By: Logan Bores M.D.   On: 09/23/2020 15:55    Procedures Procedures   Medications Ordered in ED Medications  sodium chloride flush (NS) 0.9 % injection 3 mL (3 mLs Intravenous Given 09/23/20 1750)  furosemide (LASIX) injection 40 mg (40 mg Intravenous Given 09/23/20 1807)    ED Course  I have reviewed the triage vital signs and the nursing notes.  Pertinent labs & imaging results that were available during my care of the patient were reviewed by me and considered in my medical decision making (see chart for details).  Clinical Course  as of 09/23/20 2144  Tue Sep 23, 2020  1938 Patient is reevaluated.  She was having pain in her bilateral legs.  She states that she just needed to "get off her back."  I readjusted her in bed by sitting her more upright, putting padding beneath her knees and tilting the bed back after which her symptoms fully resolved. [EH]  2004 I spoke with Dr. Jonelle Sidle who will see patient for admission.   [EH]    Clinical Course User Index [EH] Ollen Gross   MDM Rules/Calculators/A&P                          Patient is a 87 year old woman who presents today for evaluation of sudden onset of dysarthria while she was speaking with her son on the phone. On my exam patient does not have any significant facial droop or obvious weakness bilateral arm or leg however she is dysarthric.  She is able to answer yes/no questions.  Please see consult note by Dr. Cheral Marker with neurology.  As patient did not initially get tPA, plan is to watch her in the emergency room until about 7 PM when she would be outside of tPA window, if she does not deteriorate will admit to hospitalist. Patient did not have significant deterioration while inside the tPA window. I spoke with hospitalist who will  see her for admission. She denies any pain in her chest or shortness of breath.  No new pain in her back or abdomen, doubt dissection.  The patient appears reasonably stabilized for admission considering the current resources, flow, and capabilities available in the ED at this time, and I doubt any other [facility] requiring further screening and/or treatment in the ED prior to admission assuming timely admission and bed placement.  Note: Portions of this report may have been transcribed using voice recognition software. Every effort was made to ensure accuracy; however, inadvertent computerized transcription errors may be present   Final Clinical Impression(s) / ED Diagnoses Final diagnoses:  Cerebrovascular accident (CVA),  unspecified mechanism [facility])  Dysarthria    Rx / [place] Orders ED Discharge Orders     None        Ollen Gross 09/23/20 2144    Margette Fast, MD 09/24/20 1005

## 2020-09-23 NOTE — Progress Notes (Signed)
Pt arrived from the ED, alert x4, MD orders implemented, CCMD called and verified, will continue to monitor

## 2020-09-23 NOTE — ED Notes (Signed)
PA at the bedside.

## 2020-09-23 NOTE — ED Notes (Signed)
Notified MD about pt diet order.

## 2020-09-23 NOTE — ED Notes (Addendum)
SWOT nurse notified to transport pt to room 3W 15

## 2020-09-23 NOTE — ED Notes (Signed)
Pt stated clearly, "she is feeling better and ready to go." I updated pt on plan of care to keep evaluating until the provider makes a decision.

## 2020-09-23 NOTE — Consult Note (Signed)
Referring Physician: Dr. Jonelle Sidle    Chief Complaint: Acute onset of dysarthria.   HPI: [name] is an 87 y.o. female with a PMHx of CHF, CKD, HTN, dilated cardiomyopathy, cervical spondylosis and breast mass who presents to the ED with acute onset of dysarthria. She was on the phone with her son today at 1430 and started slurring her speech. She was well prior to that and denies any recent symptoms of illness recently. She also has not had headache, CP, neck pain or abdominal pain. She has severe back pain made worse with some movements as well as chronic right knee pain.   Home medications include ASA.   LSN: [id number] tPA Given: No: After discussion of risks/benefits, the patient elected not to proceed with tPA.   Past Medical History:  Diagnosis Date   Breast mass    Cervical spondylosis    Dilated cardiomyopathy ([place]) 05/2016   Mild LVH with EF of 20 1125%. Hypokinesis with inferior akinesis. GR 1 DD noted with elevated LVEDP. Moderate LA dilation. Moderate mitral regurgitation. Aortic valve aortic valve calcification/sclerosis without stenosis.   Hypertension     Past Surgical History:  Procedure Laterality Date   APPENDECTOMY     BACK SURGERY     x 4   CATARACT EXTRACTION     KNEE SURGERY     x 2   TRANSTHORACIC ECHOCARDIOGRAM  05/2016   Mild LVH with EF of 20 1125%. Hypokinesis with inferior akinesis. GR 1 DD noted with elevated LVEDP. Moderate LA dilation. Moderate mitral regurgitation. Aortic valve aortic valve calcification/sclerosis without stenosis.   VENTRAL HERNIA REPAIR N/A 09/14/2013   Procedure: LAPAROSCOPIC ventral HERNIA REPAIR with mesh ;  Surgeon: Shann Medal, MD;  Location: [facility];  Service: General;  Laterality: N/A;    Family History  Problem Relation Age of Onset   Diabetes Mother    Hypertension Mother    Cancer Father        uknown kind of cancer   Diabetes Sister    Social History:  reports that she has quit smoking. Her smoking use included  cigarettes. She has never used smokeless tobacco. She reports that she does not drink alcohol and does not use drugs.  Allergies: No Known Allergies  Medications: Prior to Admission:  Medications Prior to Admission  Medication Sig Dispense Refill Last Dose   acetaminophen (TYLENOL) 325 MG tablet Take 650 mg by mouth every 6 (six) hours as needed for mild pain.      allopurinol (ZYLOPRIM) 100 MG tablet Take 100 mg by mouth daily.       aspirin EC 81 MG tablet Take 81 mg by mouth every morning.      carvedilol (COREG) 6.25 MG tablet Take 1 tablet (6.25 mg total) by mouth 2 (two) times daily with a meal. Please schedule an appointment for further refills 60 tablet 0    ferrous sulfate 325 (65 FE) MG tablet Take 1 tablet (325 mg total) by mouth 3 (three) times daily with meals. 90 tablet 0    furosemide (LASIX) 40 MG tablet Take 1 tablet (40 mg total) by mouth 2 (two) times daily. Take 40 mg in the morning and 20 mg in afternoon, may take extra 20 mg dose in the afternoon as needed 60 tablet 0    furosemide (LASIX) 40 MG tablet PLEASE SEE ATTACHED FOR DETAILED DIRECTIONS. Please schedule appointment for refills. 60 tablet 0    hydrALAZINE (APRESOLINE) 25 MG tablet TAKE 1 TABLET (  25 MG TOTAL) BY MOUTH 2 (TWO) TIMES DAILY. NEED OV. 180 tablet 1    isosorbide mononitrate (IMDUR) 30 MG 24 hr tablet TAKE 1 TABLET BY MOUTH EVERY DAY 15 tablet 0    levalbuterol (XOPENEX HFA) 45 MCG/ACT inhaler Inhale 1 puff into the lungs every 4 (four) hours as needed for wheezing.      metolazone (ZAROXOLYN) 2.5 MG tablet Take 1 tablet (2.5 mg total) by mouth 2 (two) times a week. 30 tablet 3    metolazone (ZAROXOLYN) 2.5 MG tablet Take 1 tablet (2.5 mg total) by mouth 2 (two) times a week. <PLEASE MAKE APPOINTMENT FOR REFILLS> 10 tablet 0    Multiple Vitamins-Minerals (PRESERVISION AREDS PO) Take 1 capsule by mouth daily.       NONFORMULARY OR COMPOUNDED [place] compound:  Peripheral Neuropathy cream -  Bupivacaine 1%, Doxepin 3%, Gabapentin 6%, Pentoxifylline 3%, Topiramate 1%, dispense 120gram apply 102 grams to affected area 3-4 times daily, +3refills. 120 each 3    potassium chloride 20 MEQ TBCR Take 20 mEq by mouth daily. 30 tablet 0    Probiotic Product (ALIGN PO) Take 1 capsule by mouth daily.       ROS: As per HPI.   Physical Examination: Weight 73.2 kg.  HEENT: [place]/AT Lungs: Respirations unlabored Ext: Warm and well perfused  Neurologic Examination: Mental Status: Awake and alert. The patient is oriented to situation, time and place. She is able to comprehend all questions and in the context of moderate dysarthria with some stuttering, her speech is fluent with no errors of grammar or syntax. Has difficulty verbalizing some consonants. Thought content appropriate.  Able to demonstrate understanding of complex questions and demonstrates comprehension of medical decision making when discussing the implications of her dysarthria as possibly being due to stroke.  Cranial Nerves: II:  Visual fields intact with no extinction to DSS. PERRL III,IV, VI: No ptosis. EOMI. No nystagmus.  V,VII: Smile symmetric, facial temp sensation equal bilaterally VIII: hearing intact to voice IX,X: Mildly hypophonic speech XI: Head is midline XII: Midline tongue extension  Motor: Right : Upper extremity   5/5    Left:     Upper extremity   5/5  Lower extremity   4/5 in the context of pain  Lower extremity   5/5 No pronator drift Sensory: Temp and light touch intact throughout, bilaterally. No extinction to DSS.  Deep Tendon Reflexes:  2+ bilateral brachioradialis, 1+ bilateral patellae Plantars: Right: downgoing   Left: downgoing Cerebellar: No ataxia with FNF bilaterally  Gait: Deferred   Results for orders placed or performed during the hospital encounter of 09/23/20 (from the past 48 hour(s))  CBG monitoring, ED     Status: Abnormal   Collection Time: 09/23/20  3:39 PM  Result Value Ref  Range   Glucose-Capillary 139 (H) 70 - 99 mg/dL    Comment: Glucose reference range applies only to samples taken after fasting for at least 8 hours.  Protime-INR     Status: None   Collection Time: 09/23/20  3:40 PM  Result Value Ref Range   Prothrombin Time 14.6 11.4 - 15.2 seconds   INR 1.1 0.8 - 1.2    Comment: (NOTE) INR goal varies based on device and disease states. Performed at [facility], [place] [address] [place], [place] [address]   APTT     Status: None   Collection Time: 09/23/20  3:40 PM  Result Value Ref Range   aPTT 27 24 -  36 seconds    Comment: Performed at [facility], [place] [address] [place], [place] [address]  CBC     Status: Abnormal   Collection Time: 09/23/20  3:40 PM  Result Value Ref Range   WBC 7.3 4.0 - 10.5 K/uL   RBC 3.24 (L) 3.87 - 5.11 MIL/uL   Hemoglobin 9.9 (L) 12.0 - 15.0 g/dL   HCT 31.0 (L) 36.0 - 46.0 %   MCV 95.7 80.0 - 100.0 fL   MCH 30.6 26.0 - 34.0 pg   MCHC 31.9 30.0 - 36.0 g/dL   RDW 15.9 (H) 11.5 - 15.5 %   Platelets 155 150 - 400 K/uL   nRBC 0.0 0.0 - 0.2 %    Comment: Performed at [facility] [address] [place], [place] [address]  Differential     Status: None   Collection Time: 09/23/20  3:40 PM  Result Value Ref Range   Neutrophils Relative % 67 %   Neutro Abs 5.0 1.7 - 7.7 K/uL   Lymphocytes Relative 20 %   Lymphs Abs 1.5 0.7 - 4.0 K/uL   Monocytes Relative 9 %   Monocytes Absolute 0.6 0.1 - 1.0 K/uL   Eosinophils Relative 3 %   Eosinophils Absolute 0.2 0.0 - 0.5 K/uL   Basophils Relative 0 %   Basophils Absolute 0.0 0.0 - 0.1 K/uL   Immature Granulocytes 1 %   Abs Immature Granulocytes 0.04 0.00 - 0.07 K/uL    Comment: Performed at [facility] [address], [place], [place] [address]  I-stat chem 8, ED     Status: Abnormal   Collection Time: 09/23/20  3:46 PM  Result Value Ref Range   Sodium 136 135 - 145 mmol/L   Potassium 5.7 (H) 3.5 - 5.1 mmol/L   Chloride 102 98 - 111 mmol/L    BUN 72 (H) 8 - 23 mg/dL   Creatinine, Ser 3.40 (H) 0.44 - 1.00 mg/dL   Glucose, Bld 159 (H) 70 - 99 mg/dL    Comment: Glucose reference range applies only to samples taken after fasting for at least 8 hours.   Calcium, Ion 1.07 (L) 1.15 - 1.40 mmol/L   TCO2 27 22 - 32 mmol/L   Hemoglobin 11.2 (L) 12.0 - 15.0 g/dL   HCT 33.0 (L) 36.0 - 46.0 %   CT HEAD CODE STROKE WO CONTRAST  Result Date: 09/23/2020 CLINICAL DATA:  Code stroke.  Right-sided weakness.  Slurred speech. EXAM: CT HEAD WITHOUT CONTRAST TECHNIQUE: Contiguous axial images were obtained from the base of the skull through the vertex without intravenous contrast. COMPARISON:  Head MRI 09/16/2019 FINDINGS: Brain: There is no evidence of an acute infarct, intracranial hemorrhage, mass, midline shift, or extra-axial fluid collection. Cerebral atrophy is within normal limits for age. Chronic cortical infarcts are again noted in the right frontal and left occipital lobes, and there is also an unchanged chronic lacunar infarct in the left thalamus. Patchy hypodensities in the cerebral white matter bilaterally are nonspecific but compatible with moderate chronic small vessel ischemic disease. Vascular: Calcified atherosclerosis at the skull base. No hyperdense vessel. Skull: No fracture or suspicious osseous lesion. Sinuses/Orbits: Visualized paranasal sinuses and mastoid air cells are clear. Bilateral cataract extraction. Other: None. ASPECTS [facility] Stroke Program Early CT Score) - Ganglionic level infarction (caudate, lentiform nuclei, internal capsule, insula, M1-M3 cortex): 7 - Supraganglionic infarction (M4-M6 cortex): 3 Total score (0-10 with 10 being normal): 10 IMPRESSION: 1. No evidence of  acute intracranial abnormality. 2. ASPECTS is 10. 3. Moderate chronic small vessel ischemic disease with chronic infarcts as above. These results were communicated to Dr. Cheral Marker at 3:55 pm on 09/23/2020 by text page via the [facility] messaging system.  Electronically Signed   By: Logan Bores M.D.   On: 09/23/2020 15:55    Assessment: 87 y.o. female presenting with acute onset of dysarthria 1. Exam reveals moderate dysarthria without aphasia 2. CT head shows no evidence of acute intracranial abnormality. ASPECTS is 10. Moderate chronic small vessel ischemic disease with chronic infarcts. 3. Stroke Risk Factors - CHF, CKD, HTN and history of breast mass 4. After discussion of risks/benefits, including benefit of an approximately 30% chance of significant improvement with tPA with countervailing approximately 5% risk of ICH with potential for death or severe disability, the patient elected not to proceed with tPA. Patient and husband expressed understanding of risks/benefits.  5. Presentation not consistent with LVO.   Recommendations: 1. HgbA1c, fasting lipid panel 2. MRI, MRA of the brain without contrast 3. PT consult, OT consult, Speech consult 4. Echocardiogram 5. Carotid dopplers 6. Prophylactic therapy-Continue ASA and add Plavix 7. Risk factor modification 8. Telemetry monitoring 9. Frequent neuro checks 10. Permissive HTN for 24 hours. Given advanced age, use modified parameters: Treat SBP if > 180   '@Electronically'$  signed: Dr. Kerney Elbe  09/23/2020, 4:17 PM

## 2020-09-23 NOTE — ED Notes (Signed)
Pt has been transported to upstairs.

## 2020-09-23 NOTE — Code Documentation (Signed)
Stroke Response Nurse Documentation Code Documentation  [name] is a 87 y.o. female arriving to [place] ED via [facility] on 09/23/2020 with past medical hx of HTN, Back and Knee surgery. Code stroke was activated by EMS. Patient from home where she lives by herself and she was LKW at 1430 while on the phone with her family. The family noted sudden slurred speech and patient reported that her face felt different.   Stroke team at the bedside on patient arrival. Labs drawn and patient cleared for CT by EDP. Patient to CT with team. NIHSS 1, see documentation for details and code stroke times. Patient with dysarthria  on exam. The following imaging was completed:  CT.  Patient is not a candidate for tPA due to being too mild to treat and her family refused tPA at this time.   Care/Plan: Patient remains in the tPA window until 1900 - q30 mNIHSS/VS until 1900, then q2 x 12 hours. IF patient worsens, please page neurology immediately. Bedside handoff with ED RN Shannon/Callie.    Kathrin Greathouse  Stroke Response RN

## 2020-09-23 NOTE — ED Notes (Signed)
Pt stated she is ready to leave, and I notified provider to come speak with pt.

## 2020-09-23 NOTE — ED Triage Notes (Signed)
Pt BIB [facility] from home, activated as a code stroke by EMS, while on the phone with family, pt noted to have new slurred speech. LKW 1430 today.

## 2020-09-24 ENCOUNTER — Inpatient Hospital Stay (HOSPITAL_COMMUNITY): Payer: Medicare PPO

## 2020-09-24 ENCOUNTER — Encounter (HOSPITAL_COMMUNITY): Payer: Self-pay | Admitting: Internal Medicine

## 2020-09-24 ENCOUNTER — Inpatient Hospital Stay (HOSPITAL_BASED_OUTPATIENT_CLINIC_OR_DEPARTMENT_OTHER): Payer: Medicare PPO

## 2020-09-24 DIAGNOSIS — I639 Cerebral infarction, unspecified: Secondary | ICD-10-CM

## 2020-09-24 DIAGNOSIS — I10 Essential (primary) hypertension: Secondary | ICD-10-CM | POA: Diagnosis not present

## 2020-09-24 DIAGNOSIS — I6389 Other cerebral infarction: Secondary | ICD-10-CM | POA: Diagnosis not present

## 2020-09-24 LAB — ECHOCARDIOGRAM COMPLETE
AR max vel: 1.79 cm2
AV Area VTI: 1.65 cm2
AV Area mean vel: 1.64 cm2
AV Mean grad: 7.3 mmHg
AV Peak grad: 13.7 mmHg
Ao pk vel: 1.85 m/s
Area-P 1/2: 1.98 cm2
Height: 62 in
MV VTI: 1.61 cm2
P 1/2 time: 602 msec
S' Lateral: 4.1 cm
Weight: 2589.08 oz

## 2020-09-24 LAB — LIPID PANEL
Cholesterol: 153 mg/dL (ref 0–200)
HDL: 57 mg/dL (ref >40)
LDL Cholesterol: 81 mg/dL (ref 0–99)
Total CHOL/HDL Ratio: 2.7 RATIO
Triglycerides: 74 mg/dL (ref <150)
VLDL: 15 mg/dL (ref 0–40)

## 2020-09-24 LAB — COMPREHENSIVE METABOLIC PANEL
ALT: 11 U/L (ref 0–44)
AST: 17 U/L (ref 15–41)
Albumin: 3.2 g/dL — ABNORMAL LOW (ref 3.5–5.0)
Alkaline Phosphatase: 74 U/L (ref 38–126)
Anion gap: 11 (ref 5–15)
BUN: 73 mg/dL — ABNORMAL HIGH (ref 8–23)
CO2: 26 mmol/L (ref 22–32)
Calcium: 8.3 mg/dL — ABNORMAL LOW (ref 8.9–10.3)
Chloride: 101 mmol/L (ref 98–111)
Creatinine, Ser: 3.07 mg/dL — ABNORMAL HIGH (ref 0.44–1.00)
GFR, Estimated: 14 mL/min — ABNORMAL LOW (ref >60)
Glucose, Bld: 88 mg/dL (ref 70–99)
Potassium: 4.8 mmol/L (ref 3.5–5.1)
Sodium: 138 mmol/L (ref 135–145)
Total Bilirubin: 0.6 mg/dL (ref 0.3–1.2)
Total Protein: 6.4 g/dL — ABNORMAL LOW (ref 6.5–8.1)

## 2020-09-24 LAB — HEMOGLOBIN A1C
Hgb A1c MFr Bld: 5.4 % (ref 4.8–5.6)
Mean Plasma Glucose: 108 mg/dL

## 2020-09-24 LAB — CBC
HCT: 30 % — ABNORMAL LOW (ref 36.0–46.0)
Hemoglobin: 10.1 g/dL — ABNORMAL LOW (ref 12.0–15.0)
MCH: 31 pg (ref 26.0–34.0)
MCHC: 33.7 g/dL (ref 30.0–36.0)
MCV: 92 fL (ref 80.0–100.0)
Platelets: 148 10*3/uL — ABNORMAL LOW (ref 150–400)
RBC: 3.26 MIL/uL — ABNORMAL LOW (ref 3.87–5.11)
RDW: 15.4 % (ref 11.5–15.5)
WBC: 10.1 10*3/uL (ref 4.0–10.5)
nRBC: 0 % (ref 0.0–0.2)

## 2020-09-24 MED ORDER — FUROSEMIDE 20 MG PO TABS
20.0000 mg | ORAL_TABLET | Freq: Every day | ORAL | Status: DC
  Administered 2020-09-24 (×2): 20 mg via ORAL
  Filled 2020-09-24 (×2): qty 1

## 2020-09-24 MED ORDER — FUROSEMIDE 20 MG PO TABS: 20.0000 mg | ORAL_TABLET | Freq: Every evening | ORAL | Status: DC | PRN

## 2020-09-24 MED ORDER — FERROUS SULFATE 325 (65 FE) MG PO TABS
325.0000 mg | ORAL_TABLET | Freq: Three times a day (TID) | ORAL | Status: DC
  Administered 2020-09-24 – 2020-09-25 (×5): 325 mg via ORAL
  Filled 2020-09-24 (×5): qty 1

## 2020-09-24 MED ORDER — FUROSEMIDE 40 MG PO TABS
40.0000 mg | ORAL_TABLET | Freq: Every day | ORAL | Status: DC
  Administered 2020-09-24 – 2020-09-25 (×2): 40 mg via ORAL
  Filled 2020-09-24 (×2): qty 1

## 2020-09-24 MED ORDER — CLOPIDOGREL BISULFATE 75 MG PO TABS
75.0000 mg | ORAL_TABLET | Freq: Every day | ORAL | Status: DC
  Administered 2020-09-24 – 2020-09-25 (×2): 75 mg via ORAL
  Filled 2020-09-24 (×2): qty 1

## 2020-09-24 MED ORDER — PROSIGHT PO TABS
1.0000 | ORAL_TABLET | Freq: Every day | ORAL | Status: DC
  Administered 2020-09-24 – 2020-09-25 (×2): 1 via ORAL
  Filled 2020-09-24 (×2): qty 1

## 2020-09-24 MED ORDER — LEVALBUTEROL TARTRATE 45 MCG/ACT IN AERO
1.0000 | INHALATION_SPRAY | RESPIRATORY_TRACT | Status: DC | PRN
  Filled 2020-09-24: qty 15

## 2020-09-24 MED ORDER — HYDRALAZINE HCL 25 MG PO TABS
25.0000 mg | ORAL_TABLET | Freq: Four times a day (QID) | ORAL | Status: DC | PRN
  Administered 2020-09-25: 25 mg via ORAL
  Filled 2020-09-24: qty 1

## 2020-09-24 MED ORDER — SACCHAROMYCES BOULARDII 250 MG PO CAPS
250.0000 mg | ORAL_CAPSULE | Freq: Every day | ORAL | Status: DC
  Administered 2020-09-24 – 2020-09-25 (×2): 250 mg via ORAL
  Filled 2020-09-24 (×2): qty 1

## 2020-09-24 MED ORDER — HYDRALAZINE HCL 25 MG PO TABS
25.0000 mg | ORAL_TABLET | Freq: Two times a day (BID) | ORAL | Status: DC
  Administered 2020-09-24 – 2020-09-25 (×4): 25 mg via ORAL
  Filled 2020-09-24 (×4): qty 1

## 2020-09-24 MED ORDER — ASPIRIN EC 81 MG PO TBEC: 81.0000 mg | DELAYED_RELEASE_TABLET | Freq: Every morning | ORAL | Status: DC

## 2020-09-24 MED ORDER — ALLOPURINOL 100 MG PO TABS
100.0000 mg | ORAL_TABLET | Freq: Every day | ORAL | Status: DC
  Administered 2020-09-24 – 2020-09-25 (×2): 100 mg via ORAL
  Filled 2020-09-24 (×2): qty 1

## 2020-09-24 MED ORDER — ISOSORBIDE MONONITRATE ER 30 MG PO TB24
30.0000 mg | ORAL_TABLET | Freq: Every day | ORAL | Status: DC
  Administered 2020-09-24 – 2020-09-25 (×2): 30 mg via ORAL
  Filled 2020-09-24 (×2): qty 1

## 2020-09-24 MED ORDER — CARVEDILOL 6.25 MG PO TABS
6.2500 mg | ORAL_TABLET | Freq: Two times a day (BID) | ORAL | Status: DC
  Administered 2020-09-24 – 2020-09-25 (×3): 6.25 mg via ORAL
  Filled 2020-09-24 (×3): qty 1

## 2020-09-24 MED ORDER — METOLAZONE 2.5 MG PO TABS
2.5000 mg | ORAL_TABLET | ORAL | Status: DC
  Administered 2020-09-25: 2.5 mg via ORAL
  Filled 2020-09-24: qty 1

## 2020-09-24 MED ORDER — ACETAMINOPHEN 325 MG PO TABS: 650.0000 mg | ORAL_TABLET | Freq: Four times a day (QID) | ORAL | Status: DC | PRN

## 2020-09-24 NOTE — Progress Notes (Signed)
  Echocardiogram 2D Echocardiogram has been performed.  [name] [name] 09/24/2020, 11:20 AM

## 2020-09-24 NOTE — Progress Notes (Signed)
Carotid duplex has been completed.   Preliminary results in CV Proc.   Abram Sander 09/24/2020 1:32 PM

## 2020-09-24 NOTE — Progress Notes (Addendum)
Progress Note    [name]  [id number] DOB: 10-Nov-1923  DOA: 09/23/2020 PCP: Troy Sine, MD      Brief Narrative:    Medical records reviewed and are as summarized below:  [name] is a 87 y.o. female       Assessment/Plan:   Principal Problem:   Acute CVA (cerebrovascular accident) [facility]) Active Problems:   CKD (chronic kidney disease) stage 4, GFR 15-29 ml/min (HCC)   Combined systolic and diastolic congestive heart failure, NYHA class 3 (HCC)   FTT (failure to thrive) in adult   Hyperkalemia    Body mass index is 29.6 kg/m.    Acute infarct/stroke within the left parietal, occipital and posterior temporal lobes, possible additional punctate acute infarct in the posterior left frontal lobe, bilateral ICA high-grade stenosis: Continue aspirin and Plavix for for 3 months followed by monotherapy with Plavix alone.  Continue Lipitor.  PT and OT.  Chronic systolic CHF: 2D echo showed EF estimated at 30 to [id number], grade 2 diastolic dysfunction, moderate mitral regurgitation.  Previous 2D echo in February 2018 showed EF estimated at 20 to 25%.  Continue antihypertensives and follow-up with cardiologist as an outpatient.  Hypertension: Continue antihypertensives.  CKD stage IV: Creatinine is stable.  Called her daughter, [name], to discuss plan of care, there was no response.     Diet Order             Diet Heart Room service appropriate? Yes; Fluid consistency: Thin  Diet effective now                      Consultants: Neurologist  Procedures: None    Medications:    allopurinol  100 mg Oral Daily   aspirin  325 mg Oral Daily   atorvastatin  40 mg Oral Daily   carvedilol  6.25 mg Oral BID WC   clopidogrel  75 mg Oral Daily   ferrous sulfate  325 mg Oral TID WC   furosemide  20 mg Oral QHS   furosemide  40 mg Oral Daily   heparin  5,000 Units Subcutaneous Q8H   hydrALAZINE  25 mg Oral BID   isosorbide mononitrate  30  mg Oral Daily   metolazone  2.5 mg Oral Once per day on Mon Thu   multivitamin  1 tablet Oral Daily   saccharomyces boulardii  250 mg Oral Daily   Continuous Infusions:   Anti-infectives (From admission, onward)    None              Family Communication/Anticipated D/C date and plan/Code Status   DVT prophylaxis: heparin injection 5,000 Units Start: 09/23/20 2330     Code Status: DNR  Family Communication: None Disposition Plan:    Status is: Inpatient  Remains inpatient appropriate because:Inpatient level of care appropriate due to severity of illness  Dispo: The patient is from: Home              Anticipated d/c is to: Home              Patient currently is not medically stable to d/c.   Difficult to place patient No           Subjective:   C/o general weakness.  Echo tech was at the bedside.  Objective:      Vitals:   09/24/20 1934 09/24/20 2253  BP: (!) 157/65 (!) 176/81  Pulse: 76 85  Resp: 16  16  Temp: 98.2 F (36.8 C) 98.1 F (36.7 C)  TempSrc: Oral Oral  SpO2: 97% 98%  Weight:    Height:     Orthostatic VS for the past 24 hrs:  BP- Lying Pulse- Lying BP- Sitting Pulse- Sitting  09/24/20 1527 130/58 62 118/55 66     Intake/Output Summary (Last 24 hours) at 09/25/2020 0713 Last data filed at 09/24/2020 1935 Gross per 24 hour  Intake 120 ml  Output 700 ml  Net -580 ml   Filed Weights   09/23/20 1500 09/23/20 2227  Weight: 73.2 kg 73.4 kg    Exam:  GEN: NAD SKIN: Warm and dry EYES: EOMI ENT: MMM CV: RRR PULM: CTA B ABD: soft, obese, NT, +BS CNS: AAO x 3, non focal EXT: No edema or tenderness        Data Reviewed:   I have personally reviewed following labs and imaging studies:  Labs: Labs show the following:   Basic Metabolic Panel: Recent Labs  Lab 09/23/20 1540 09/23/20 1546 09/24/20 0459  NA 136 136 138  K 5.9* 5.7* 4.8  CL 100 102 101  CO2 27  --  26  GLUCOSE 163* 159* 88  BUN 78* 72*  73*  CREATININE 3.38* 3.40* 3.07*  CALCIUM 8.3*  --  8.3*   GFR Estimated Creatinine Clearance: 10.3 mL/min (A) (by C-G formula based on SCr of 3.07 mg/dL (H)). Liver Function Tests: Recent Labs  Lab 09/23/20 1540 09/24/20 0459  AST 18 17  ALT 10 11  ALKPHOS 82 74  BILITOT 0.3 0.6  PROT 7.0 6.4*  ALBUMIN 3.4* 3.2*   No results for input(s): LIPASE, AMYLASE in the last 168 hours. No results for input(s): AMMONIA in the last 168 hours. Coagulation profile Recent Labs  Lab 09/23/20 1540  INR 1.1    CBC: Recent Labs  Lab 09/23/20 1540 09/23/20 1546 09/24/20 0459  WBC 7.3  --  10.1  NEUTROABS 5.0  --   --   HGB 9.9* 11.2* 10.1*  HCT 31.0* 33.0* 30.0*  MCV 95.7  --  92.0  PLT 155  --  148*   Cardiac Enzymes: No results for input(s): CKTOTAL, CKMB, CKMBINDEX, TROPONINI in the last 168 hours. BNP (last 3 results) No results for input(s): PROBNP in the last 8760 hours. CBG: Recent Labs  Lab 09/23/20 1539  GLUCAP 139*   D-Dimer: No results for input(s): DDIMER in the last 72 hours. Hgb A1c: Recent Labs    09/24/20 0459  HGBA1C 5.4   Lipid Profile: Recent Labs    09/24/20 0459  CHOL 153  HDL 57  LDLCALC 81  TRIG 74  CHOLHDL 2.7   Thyroid function studies: No results for input(s): TSH, T4TOTAL, T3FREE, THYROIDAB in the last 72 hours.  Invalid input(s): FREET3 Anemia work up: No results for input(s): VITAMINB12, FOLATE, FERRITIN, TIBC, IRON, RETICCTPCT in the last 72 hours. Sepsis Labs: Recent Labs  Lab 09/23/20 1540 09/24/20 0459  WBC 7.3 10.1    Microbiology Recent Results (from the past 240 hour(s))  Resp Panel by RT-PCR (Flu A&B, Covid) Nasopharyngeal Swab     Status: None   Collection Time: 09/23/20  8:12 PM   Specimen: Nasopharyngeal Swab; Nasopharyngeal(NP) swabs in vial transport medium  Result Value Ref Range Status   SARS Coronavirus 2 by RT PCR NEGATIVE NEGATIVE Final    Comment: (NOTE) SARS-CoV-2 target nucleic acids are NOT  DETECTED.  The SARS-CoV-2 RNA is generally detectable in upper respiratory specimens during  the acute phase of infection. The lowest concentration of SARS-CoV-2 viral copies this assay can detect is 138 copies/mL. A negative result does not preclude SARS-Cov-2 infection and should not be used as the sole basis for treatment or other patient management decisions. A negative result may occur with  improper specimen collection/handling, submission of specimen other than nasopharyngeal swab, presence of viral mutation(s) within the areas targeted by this assay, and inadequate number of viral copies(<138 copies/mL). A negative result must be combined with clinical observations, patient history, and epidemiological information. The expected result is Negative.  Fact Sheet for Patients:  EntrepreneurPulse.com.au  Fact Sheet for Healthcare Providers:  IncredibleEmployment.be  This test is no t yet approved or cleared by the [place] FDA and  has been authorized for detection and/or diagnosis of SARS-CoV-2 by FDA under an Emergency Use Authorization (EUA). This EUA will remain  in effect (meaning this test can be used) for the duration of the COVID-19 declaration under Section 564(b)(1) of the Act, 21 U.S.C.section 360bbb-3(b)(1), unless the authorization is terminated  or revoked sooner.       Influenza A by PCR NEGATIVE NEGATIVE Final   Influenza B by PCR NEGATIVE NEGATIVE Final    Comment: (NOTE) The Xpert Xpress SARS-CoV-2/FLU/RSV plus assay is intended as an aid in the diagnosis of influenza from Nasopharyngeal swab specimens and should not be used as a sole basis for treatment. Nasal washings and aspirates are unacceptable for Xpert Xpress SARS-CoV-2/FLU/RSV testing.  Fact Sheet for Patients: EntrepreneurPulse.com.au  Fact Sheet for Healthcare Providers: IncredibleEmployment.be  This test is not yet  approved or cleared by the [place] FDA and has been authorized for detection and/or diagnosis of SARS-CoV-2 by FDA under an Emergency Use Authorization (EUA). This EUA will remain in effect (meaning this test can be used) for the duration of the COVID-19 declaration under Section 564(b)(1) of the Act, 21 U.S.C. section 360bbb-3(b)(1), unless the authorization is terminated or revoked.  Performed at [facility], [place] [address] [place], [place] [address]     Procedures and diagnostic studies:  MR ANGIO HEAD WO CONTRAST  Result Date: 09/24/2020 CLINICAL DATA:  87 year old female with slurred speech. Small cortical and white matter infarcts in the left hemisphere on MRI earlier today, most apparent in the left PCA territory. EXAM: MRA HEAD WITHOUT CONTRAST TECHNIQUE: Angiographic images of the Circle of Willis were acquired using MRA technique without intravenous contrast. COMPARISON:  Brain MRI 0548 hours today. FINDINGS: No intracranial mass effect or ventriculomegaly. Antegrade flow in the posterior circulation with dominant left vertebral artery. Mild distal vertebral artery irregularity without significant stenosis. Bilateral PICA remain patent. Patent vertebrobasilar junction and basilar artery with mild irregularity but no significant stenosis. SCA and right PCA origins are patent. There is a fetal left PCA origin. Right posterior communicating artery is diminutive or absent. Multifocal moderate to severe irregularity and stenosis of the left PCA P2 on series 255, image 8. Distal left PCA branch flow is maintained. Comparatively mild right PCA P2 irregularity. Distal right PCA branches are within normal limits. Antegrade flow in both ICA siphons. Long segment mild to moderate left siphon stenosis beginning at the anterior genu, with more pronounced moderate left ICA terminus stenosis distal to the patent left posterior communicating artery origin (series 252, image 12). The left ACA  A1 is diminutive or absent. The left MCA M1 remains patent to the bifurcation without stenosis. Left MCA branches appear within normal limits. Contralateral right ICA siphon with only  mild supraclinoid stenosis, but small 2 mm aneurysm of the anterior distal ICA on series 2, image 101. Right ICA terminus, right MCA and ACA origins are patent. Anterior communicating artery is within normal limits. Azygos type initial ACA A2 anatomy with a trifurcation and visible ACA branches within normal limits. Right MCA M1 segment and trifurcation are patent without stenosis. Visible right MCA branches are within normal limits. IMPRESSION: 1. Negative for large vessel occlusion, but positive for a fetal Left PCA origin with multifocal moderate to severe Left P2 irregularity and stenosis, and a moderate stenosis of the Left ICA terminus distal to the Pcomm origin. 2. 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 Electronically Signed   By: Genevie Ann M.D.   On: 09/24/2020 10:19   MR BRAIN WO CONTRAST  Result Date: 09/24/2020 CLINICAL DATA:  Stroke suspected. Additional provided: Slurred speech. EXAM: MRI HEAD WITHOUT CONTRAST TECHNIQUE: Multiplanar, multiecho pulse sequences of the brain and surrounding structures were obtained without intravenous contrast. COMPARISON:  Noncontrast head CT 09/23/2020.  Brain MRI 09/16/2019. FINDINGS: Brain: Mild-to-moderate generalized cerebral atrophy. Comparatively mild cerebellar atrophy. There are a few small cortical/subcortical acute infarcts within the left parietal, occipital and posterior temporal lobes (PCA vascular territory). Possible additional punctate acute infarct within the posterior left frontal lobe (motor strip). Redemonstrated chronic cortically based infarcts within the right frontal and left occipital lobes. As before, there is a small amount of chronic hemosiderin deposition at site of the right frontal  and left occipital lobe infarcts, as well as within the right occipital lobe. Moderate multifocal T2/FLAIR hyperintensity within the cerebral white matter and pons, nonspecific but compatible with chronic small vessel ischemic disease. Redemonstrated chronic lacunar infarct within the left thalamus. Redemonstrated small chronic infarcts within the bilateral cerebellar hemispheres. No evidence of intracranial mass. No extra-axial fluid collection. No midline shift. Vascular: Expected proximal arterial flow voids. Skull and upper cervical spine: No focal suspicious marrow lesion. Susceptibility artifact arising from hardware within the upper cervical spine. Sinuses/Orbits: Visualized orbits show no acute finding. Trace bilateral ethmoid sinus mucosal thickening. IMPRESSION: There are a few small cortical/subcortical acute infarcts within the left parietal, occipital and posterior temporal lobes (PCA vascular territory). Possible additional punctate acute infarct within the posterior left frontal lobe (motor strip). Otherwise stable non-contrast MRI appearance of the brain as compared to 09/16/2019. Chronic cortically based infarcts within the right frontal and left occipital lobes. Chronic hemosiderin deposition at site of these infarcts and within the right occipital lobe. Moderate chronic small vessel ischemic changes within the cerebral white matter and pons. Chronic left thalamic lacunar infarct. Small chronic infarcts within the bilateral cerebellar hemispheres. Electronically Signed   By: Kellie Simmering DO   On: 09/24/2020 08:20   DG Chest Portable 1 View  Result Date: 09/23/2020 CLINICAL DATA:  Code stroke.  New slurred speech. EXAM: PORTABLE CHEST 1 VIEW COMPARISON:  08/08/2016 FINDINGS: Heart size and pulmonary vascularity are normal for technique. Slight infiltration or atelectasis in the left lung base. No pleural effusions. No pneumothorax. Calcified and tortuous aorta. Degenerative changes in the spine  and shoulders. IMPRESSION: Slight infiltration or atelectasis in the left base. Electronically Signed   By: Lucienne Capers M.D.   On: 09/23/2020 17:23   ECHOCARDIOGRAM COMPLETE  Result Date: 09/24/2020    ECHOCARDIOGRAM REPORT   Patient Name:   [name] Date of Exam: 09/24/2020 Medical Rec #:  [id number]  Height:       62.0 in Accession #:    [id number]     Weight:       161.8 lb Date of Birth:  15-Aug-1923      BSA:          1.747 m Patient Age:    89 years       BP:           150/63 mmHg Patient Gender: F              HR:           67 bpm. Exam Location:  Inpatient Procedure: 2D Echo, Cardiac Doppler and Color Doppler Indications:    Stroke I63.9  History:        Patient has prior history of Echocardiogram examinations, most                 recent 06/09/2016. Cardiomyopathy; Risk Factors:Hypertension.  Sonographer:    Jonelle Sidle Dance Referring Phys: [place]  1. Left ventricular ejection fraction, by estimation, is 30 to 35%. The left ventricle has moderately decreased function. The left ventricle demonstrates global hypokinesis. Left ventricular diastolic parameters are consistent with Grade II diastolic dysfunction (pseudonormalization).  2. Right ventricular systolic function is normal. The right ventricular size is normal. There is normal pulmonary artery systolic pressure. The estimated right ventricular systolic pressure is 0000000 mmHg.  3. The aortic valve is tricuspid. There is moderate calcification of the aortic valve. Aortic valve regurgitation is mild. Aortic valve sclerosis/calcification is present, without any evidence of aortic stenosis. Aortic regurgitation PHT measures 602 msec. Aortic valve mean gradient measures 7.3 mmHg.  4. The mitral valve is abnormal. Moderate mitral valve regurgitation. Moderate mitral annular calcification.  5. Left atrial size was moderately dilated.  6. The inferior vena cava is normal in size with greater than 50% respiratory  variability, suggesting right atrial pressure of 3 mmHg. Comparison(s): A prior study was performed on 06/09/2014. Prior images reviewed side by side. LVEF has improved. FINDINGS  Left Ventricle: Left ventricular ejection fraction, by estimation, is 30 to 35%. The left ventricle has moderately decreased function. The left ventricle demonstrates global hypokinesis. The left ventricular internal cavity size was normal in size. There is no left ventricular hypertrophy. Left ventricular diastolic parameters are consistent with Grade II diastolic dysfunction (pseudonormalization). Right Ventricle: The right ventricular size is normal. No increase in right ventricular wall thickness. Right ventricular systolic function is normal. There is normal pulmonary artery systolic pressure. The tricuspid regurgitant velocity is 2.58 m/s, and  with an assumed right atrial pressure of 3 mmHg, the estimated right ventricular systolic pressure is 0000000 mmHg. Left Atrium: Left atrial size was moderately dilated. Right Atrium: Right atrial size was normal in size. Pericardium: There is no evidence of pericardial effusion. Mitral Valve: The mitral valve is abnormal. Moderate mitral annular calcification. Moderate mitral valve regurgitation. MV peak gradient, 11.4 mmHg. The mean mitral valve gradient is 4.0 mmHg with average heart rate of 61 bpm. Tricuspid Valve: The tricuspid valve is normal in structure. Tricuspid valve regurgitation is trivial. No evidence of tricuspid stenosis. Aortic Valve: The aortic valve is tricuspid. There is moderate calcification of the aortic valve. Aortic valve regurgitation is mild. Aortic regurgitation PHT measures 602 msec. Mild to moderate aortic valve sclerosis/calcification is present, without any evidence of aortic stenosis. Aortic valve mean gradient measures 7.3 mmHg. Aortic valve peak gradient measures 13.7 mmHg. Aortic valve area, by VTI measures 1.65  cm. Pulmonic Valve: The pulmonic valve was not  well visualized. Pulmonic valve regurgitation is not visualized. No evidence of pulmonic stenosis. Aorta: The aortic root and ascending aorta are structurally normal, with no evidence of dilitation. Venous: The inferior vena cava is normal in size with greater than 50% respiratory variability, suggesting right atrial pressure of 3 mmHg. IAS/Shunts: The atrial septum is grossly normal.  LEFT VENTRICLE PLAX 2D LVIDd:         4.90 cm  Diastology LVIDs:         4.10 cm  LV e' medial:    3.26 cm/s LV PW:         1.00 cm  LV E/e' medial:  35.0 LV IVS:        1.10 cm  LV e' lateral:   5.00 cm/s LVOT diam:     2.10 cm  LV E/e' lateral: 22.8 LV SV:         71 LV SV Index:   41 LVOT Area:     3.46 cm  RIGHT VENTRICLE            IVC RV Basal diam:  2.70 cm    IVC diam: 1.40 cm RV S prime:     7.94 cm/s TAPSE (M-mode): 1.9 cm LEFT ATRIUM             Index       RIGHT ATRIUM           Index LA diam:        4.40 cm 2.52 cm/m  RA Area:     14.70 cm LA Vol (A2C):   63.5 ml 36.35 ml/m RA Volume:   37.20 ml  21.29 ml/m LA Vol (A4C):   69.7 ml 39.89 ml/m LA Biplane Vol: 70.2 ml 40.18 ml/m  AORTIC VALVE AV Area (Vmax):    1.79 cm AV Area (Vmean):   1.64 cm AV Area (VTI):     1.65 cm AV Vmax:           185.00 cm/s AV Vmean:          125.333 cm/s AV VTI:            0.431 m AV Peak Grad:      13.7 mmHg AV Mean Grad:      7.3 mmHg LVOT Vmax:         95.35 cm/s LVOT Vmean:        59.200 cm/s LVOT VTI:          0.206 m LVOT/AV VTI ratio: 0.48 AI PHT:            602 msec  AORTA Ao Root diam: 3.20 cm Ao Asc diam:  3.10 cm MITRAL VALVE                TRICUSPID VALVE MV Area (PHT): 1.98 cm     TR Peak grad:   26.6 mmHg MV Area VTI:   1.61 cm     TR Vmax:        258.00 cm/s MV Peak grad:  11.4 mmHg MV Mean grad:  4.0 mmHg     SHUNTS MV Vmax:       1.69 m/s     Systemic VTI:  0.21 m MV Vmean:      98.2 cm/s    Systemic Diam: 2.10 cm MV Decel Time: 384 msec MV E velocity: 114.00 cm/s MV A velocity: 177.00 cm/s MV E/A ratio:  0.64 Rudean Haskell MD  Electronically signed by Rudean Haskell MD Signature Date/Time: 09/24/2020/1:28:02 PM    Final    CT HEAD CODE STROKE WO CONTRAST  Result Date: 09/23/2020 CLINICAL DATA:  Code stroke.  Right-sided weakness.  Slurred speech. EXAM: CT HEAD WITHOUT CONTRAST TECHNIQUE: Contiguous axial images were obtained from the base of the skull through the vertex without intravenous contrast. COMPARISON:  Head MRI 09/16/2019 FINDINGS: Brain: There is no evidence of an acute infarct, intracranial hemorrhage, mass, midline shift, or extra-axial fluid collection. Cerebral atrophy is within normal limits for age. Chronic cortical infarcts are again noted in the right frontal and left occipital lobes, and there is also an unchanged chronic lacunar infarct in the left thalamus. Patchy hypodensities in the cerebral white matter bilaterally are nonspecific but compatible with moderate chronic small vessel ischemic disease. Vascular: Calcified atherosclerosis at the skull base. No hyperdense vessel. Skull: No fracture or suspicious osseous lesion. Sinuses/Orbits: Visualized paranasal sinuses and mastoid air cells are clear. Bilateral cataract extraction. Other: None. ASPECTS [facility] Stroke Program Early CT Score) - Ganglionic level infarction (caudate, lentiform nuclei, internal capsule, insula, M1-M3 cortex): 7 - Supraganglionic infarction (M4-M6 cortex): 3 Total score (0-10 with 10 being normal): 10 IMPRESSION: 1. No evidence of acute intracranial abnormality. 2. ASPECTS is 10. 3. Moderate chronic small vessel ischemic disease with chronic infarcts as above. These results were communicated to Dr. Cheral Marker at 3:55 pm on 09/23/2020 by text page via the [facility] messaging system. Electronically Signed   By: Logan Bores M.D.   On: 09/23/2020 15:55   VAS US CAROTID (at [facility] and [facility] only)  Result Date: 09/24/2020 Carotid Arterial Duplex Study Patient Name:  [name]  Date of Exam:   09/24/2020 Medical Rec #:  [id number]       Accession #:    [id number] Date of Birth: December 10, 1923       Patient Gender: F Patient Age:   43Y Exam Location:  [facility] Procedure:      VAS US CAROTID Referring Phys: [id number] Elwyn Reach --------------------------------------------------------------------------------  Indications:       CVA. Risk Factors:      Hypertension. Comparison Study:  no prior Performing Technologist: Archie Patten RVS  Examination Guidelines: A complete evaluation includes B-mode imaging, spectral Doppler, color Doppler, and power Doppler as needed of all accessible portions of each vessel. Bilateral testing is considered an integral part of a complete examination. Limited examinations for reoccurring indications may be performed as noted.  Right Carotid Findings: +----------+-------+-------+--------+---------------------------------+--------+           PSV    EDV    StenosisPlaque Description               Comments           cm/s   cm/s                                                     +----------+-------+-------+--------+---------------------------------+--------+ CCA Prox  54     12             heterogenous                              +----------+-------+-------+--------+---------------------------------+--------+ CCA Distal49     16  heterogenous                              +----------+-------+-------+--------+---------------------------------+--------+ ICA Prox  450    187    80-99%  heterogenous, calcific and                                                irregular                                 +----------+-------+-------+--------+---------------------------------+--------+ ICA Mid   368    198                                                      +----------+-------+-------+--------+---------------------------------+--------+ ICA Distal79     20                                                        +----------+-------+-------+--------+---------------------------------+--------+ ECA       46                                                              +----------+-------+-------+--------+---------------------------------+--------+ +----------+--------+-------+--------+-------------------+           PSV cm/sEDV cmsDescribeArm Pressure (mmHG) +----------+--------+-------+--------+-------------------+ [id number]                                         +----------+--------+-------+--------+-------------------+ +---------+--------+--+--------+-+---------+ VertebralPSV cm/s34EDV cm/s9Antegrade +---------+--------+--+--------+-+---------+  Left Carotid Findings: +----------+-------+-------+--------+---------------------------------+--------+           PSV    EDV    StenosisPlaque Description               Comments           cm/s   cm/s                                                     +----------+-------+-------+--------+---------------------------------+--------+ CCA Prox  69     12             heterogenous                              +----------+-------+-------+--------+---------------------------------+--------+ CCA Distal51     7              heterogenous                              +----------+-------+-------+--------+---------------------------------+--------+ ICA Prox  342    125    80-99%  heterogenous, calcific and                                                irregular                                 +----------+-------+-------+--------+---------------------------------+--------+ ICA Mid   54     13                                                       +----------+-------+-------+--------+---------------------------------+--------+ ICA Distal22     9                                                        +----------+-------+-------+--------+---------------------------------+--------+ ECA       83                                                               +----------+-------+-------+--------+---------------------------------+--------+ +----------+--------+--------+--------+-------------------+           PSV cm/sEDV cm/sDescribeArm Pressure (mmHG) +----------+--------+--------+--------+-------------------+ Subclavian50                                          +----------+--------+--------+--------+-------------------+ +---------+--------+--+--------+--+---------+ VertebralPSV cm/s30EDV cm/s15Antegrade +---------+--------+--+--------+--+---------+   Summary: Right Carotid: Velocities in the right ICA are consistent with a 80-99%                stenosis. Left Carotid: Velocities in the left ICA are consistent with a 80-99% stenosis. Vertebrals: Bilateral vertebral arteries demonstrate antegrade flow. *See table(s) above for measurements and observations.  Electronically signed by Antony Contras MD on 09/24/2020 at 4:39:18 PM.    Final                LOS: 2 days   Mana Haberl  [facility]   Pager on [email or web address]. If 7PM-7AM, please contact night-coverage at [email or web address]     09/25/2020, 7:13 AM

## 2020-09-24 NOTE — Progress Notes (Signed)
   09/24/20 2340  Provider Notification  Provider Name/Title Dr Myna Hidalgo  Date Provider Notified 09/24/20  Time Provider Notified 2340  Notification Type Page  Notification Reason Other (Comment) (Pt's BP read 184/83, trending up)  Provider response See new orders  Date of Provider Response 09/24/20  Time of Provider Response [phone]

## 2020-09-24 NOTE — Evaluation (Signed)
Occupational Therapy Evaluation Patient Details Name: [name] MRN: [id number] DOB: 07-22-1923 Today's Date: 09/24/2020    History of Present Illness 87 y/o female presented to ED on 6/14 for evaluation of sudden onset of dysarthria. CT head negative. MRI found a few small cortical/subcortical acute PCA infarcts, possible acute punctate infarct. PMH: CHF, CKD, HTN, dilated cardiomyopathy, cervical spondylosis, breast mass, chronic back pain   Clinical Impression   Pt admitted with the above diagnosis and demonstrates the below listed deficits.  She requires min guard assist for ADLs, and appears that she is approximating her baseline.  Pt lives alone with intermittent assist with IADLs, but was active and independent with ADLs and IADLs including meal prep, medication and financial management.  Recommend she have close/24 hour supervision initially upon returning home to ensure her safety in her home environment, and recommend HHOT to ensure she is returning to baseline.  All further OT needs can be deferred to [facility].     Follow Up Recommendations  Home health OT;Supervision/Assistance - 24 hour (24 hour supervision initially upon return home to ensure she is functioning well in her home, then can reduce amount of supervision required based on her performance and needs.  Recommend direct supervision for meds and finances initially)    Equipment Recommendations  None recommended by OT    Recommendations for Other Services       Precautions / Restrictions Precautions Precautions: Fall Precaution Comments: chronic back pain, LE spasms Restrictions Weight Bearing Restrictions: No      Mobility Bed Mobility Overal bed mobility: Needs Assistance Bed Mobility: Supine to Sit     Supine to sit: Supervision     General bed mobility comments: pt sitting EOB finishing with PT    Transfers Overall transfer level: Needs assistance Equipment used: Rolling walker (2  wheeled) Transfers: Sit to/from [facility] Sit to Stand: Min guard Stand pivot transfers: Min guard       General transfer comment: min guard for safety, increased time to perform but no physical assist needed    Balance Overall balance assessment: Needs assistance Sitting-balance support: No upper extremity supported;Feet supported Sitting balance-Leahy Scale: Good     Standing balance support: No upper extremity supported Standing balance-Leahy Scale: Fair Standing balance comment: able to maintain static standing with min guard assist                           ADL either performed or assessed with clinical judgement   ADL Overall ADL's : Needs assistance/impaired Eating/Feeding: Independent   Grooming: Wash/dry hands;Wash/dry face;Oral care;Brushing hair;Min guard;Standing   Upper Body Bathing: Set up;Sitting   Lower Body Bathing: Min guard;Sit to/from stand   Upper Body Dressing : Set up;Sitting   Lower Body Dressing: Min guard;Sit to/from stand   Toilet Transfer: Min guard;Ambulation;Comfort height toilet;Grab bars;RW   Toileting- Water quality scientist and Hygiene: Min guard;Sit to/from stand       Functional mobility during ADLs: Min guard;Rolling walker       Vision Baseline Vision/History: Wears glasses Wears Glasses: Reading only Patient Visual Report: No change from baseline Vision Assessment?: Yes Eye Alignment: Within Functional Limits Ocular Range of Motion: Within Functional Limits Alignment/Gaze Preference: Within Defined Limits Tracking/Visual Pursuits: Able to track stimulus in all quads without difficulty Convergence: Within functional limits Visual Fields: No apparent deficits     Agricultural engineer Tested?: Yes   Praxis Praxis Praxis tested?: Within functional limits  Pertinent Vitals/Pain Pain Assessment: Faces Faces Pain Scale: Hurts little more Pain Location: back pain Pain  Descriptors / Indicators: Grimacing;Aching Pain Intervention(s): Repositioned;Monitored during session     Hand Dominance Right   Extremity/Trunk Assessment Upper Extremity Assessment Upper Extremity Assessment: Overall WFL for tasks assessed   Lower Extremity Assessment Lower Extremity Assessment: Overall WFL for tasks assessed   Cervical / Trunk Assessment Cervical / Trunk Assessment: Kyphotic   Communication Communication Communication: No difficulties (patient reports at baseline with speech)   Cognition Arousal/Alertness: Awake/alert Behavior During Therapy: WFL for tasks assessed/performed Overall Cognitive Status: Within Functional Limits for tasks assessed                                 General Comments: Cognitiion appears to be at baseline.  The Short Blessed Test was administered with pt scoring 4/28 with minor difficulties noted with recall.  Anticipate she is at, or close to baseline, but would benefit from [facility] to ensure she is independent with executive functions   General Comments       Exercises     Shoulder Instructions      Home Living Family/patient expects to be discharged to:: Private residence Living Arrangements: Alone Available Help at Discharge: Family;Available PRN/intermittently;Personal care attendant Type of Home: House Home Access: Level entry     Home Layout: One level     Bathroom Shower/Tub: Teacher, early years/pre: Handicapped height     Home Equipment: Environmental consultant - 2 wheels;Toilet riser;Cane - single point;Bedside commode;Grab bars - tub/shower          Prior Functioning/Environment Level of Independence: Independent with assistive device(s)        Comments: Ambulates with RW household distances. PCA 2x/week and does laundry/cleaning. Manages own medications and cooks.  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 [employer]        OT Problem List:  Decreased activity tolerance      OT Treatment/Interventions:      OT Goals(Current goals can be found in the care plan section) Acute Rehab OT Goals Patient Stated Goal: to go home today OT Goal Formulation: All assessment and education complete, DC therapy  OT Frequency:     Barriers to D/C:            Co-evaluation              AM-PAC OT "6 Clicks" Daily Activity     Outcome Measure Help from another person eating meals?: None Help from another person taking care of personal grooming?: A Little Help from another person toileting, which includes using toliet, bedpan, or urinal?: A Little Help from another person bathing (including washing, rinsing, drying)?: A Little Help from another person to put on and taking off regular upper body clothing?: A Little Help from another person to put on and taking off regular lower body clothing?: A Little 6 Click Score: 19   End of Session Equipment Utilized During Treatment: Rolling walker Nurse Communication: Mobility status  Activity Tolerance: Patient tolerated treatment well Patient left: in chair;with call bell/phone within reach;with chair alarm set  OT Visit Diagnosis: Unsteadiness on feet (R26.81)                Time: 0935-1000 OT Time Calculation (min): 25 min Charges:  OT General Charges $OT Visit: 1 Visit OT Evaluation $OT Eval Low Complexity: 1 Low  OT Treatments $Self Care/Home Management : 8-22 mins  Nilsa Nutting., OTR/L Acute Rehabilitation Services Pager [phone] Office [phone]   Lucille Passy M 09/24/2020, 10:15 AM

## 2020-09-24 NOTE — TOC Initial Note (Signed)
Transition of Care [facility]) - Initial/Assessment Note    Patient Details  Name: [name] MRN: [id number] Date of Birth: [birth year]  [facility] CM/SW Contact:    Pollie Friar, RN Phone Number: 09/24/2020, 1:21 PM  Clinical Narrative:                 Patient with recommendations for [facility] services. CM met with the patient and her daughter at the bedside. They had no preference for [facility] agency. CM has arranged [facility] for PT/OT. Information on the AVS.  No new DME needs.  Pt may go and stay with her daughter at d/c. Daughter will update Alvis Lemmings on her address when they call for first appointment. ToC following.  Expected Discharge Plan: [place] Barriers to Discharge: Continued Medical Work up   Patient Goals and CMS Choice   CMS Medicare.gov Compare Post Acute Care list provided to:: Patient Choice offered to / list presented to : Patient, Adult Children  Expected Discharge Plan and Services Expected Discharge Plan: [place]   Discharge Planning Services: CM Consult Post Acute Care Choice: [place] arrangements for the past 2 months: Single Family Home                           HH Arranged: PT, OT HH Agency: [place] Date [facility] Agency Contacted: 09/24/20   Representative spoke with at [place]: Tommi Rumps  Prior Living Arrangements/Services Living arrangements for the past 2 months: [place] Lives with:: Self Patient language and need for interpreter reviewed:: Yes Do you feel safe going back to the place where you live?: Yes      Need for Family Participation in Patient Care: Yes (Comment) Care giver support system in place?: Yes (comment) Current home services: DME (all needed per daughter) Criminal Activity/Legal Involvement Pertinent to Current Situation/Hospitalization: No - Comment as needed  Activities of Daily Living      Permission Sought/Granted                   Emotional Assessment Appearance:: Appears stated age Attitude/Demeanor/Rapport: Engaged Affect (typically observed): Accepting Orientation: : Oriented to Self, Oriented to Place, Oriented to  Time, Oriented to Situation   Psych Involvement: No (comment)  Admission diagnosis:  Dysarthria [R47.1] Acute CVA (cerebrovascular accident) ([place]) [I63.9] Cerebrovascular accident (CVA), unspecified mechanism ([place]) [I63.9] Patient Active Problem List   Diagnosis Date Noted   Acute CVA (cerebrovascular accident) ([place]) 09/23/2020   Hyperkalemia 09/23/2020   Hypertensive disorder 11/14/2017   Goals of care, counseling/discussion    Palliative care by specialist    Acute on chronic systolic heart failure, NYHA class 3 ([place]) 08/07/2016   Acute kidney injury ([place]) 08/07/2016   FTT (failure to thrive) in adult 08/07/2016   Dilated cardiomyopathy ([place]) 07/19/2016   Combined systolic and diastolic congestive heart failure, NYHA class 3 ([place]) 07/19/2016   Anemia 06/08/2016   Ventral hernia 09/15/2013   CKD (chronic kidney disease) stage 4, GFR 15-29 ml/min (HCC) 09/15/2013   Acute exacerbation of CHF (congestive heart failure) ([place]) 09/14/2013   Partial bowel obstruction ([place]) 09/13/2013   PCP:  Troy Sine, MD Pharmacy:   [id number]Phone: [phone]: [phone]    Social Determinants of Health (SDOH) Interventions    Readmission Risk Interventions No flowsheet  data found.

## 2020-09-24 NOTE — Progress Notes (Addendum)
STROKE TEAM PROGRESS NOTE   ATTENDING NOTE: I reviewed above note and agree with the assessment and plan. Pt was seen and examined.   87 year old female with history of CHF, CKD, hypertension, cardiomyopathy, breast mass admitted for slurred speech.  CT no acute finding.  Offered tPA but patient declined.  MRI showed several punctate/small infarcts at left MCA and MCA/PCA territory.  MRA head showed left P2 and left terminal ICA stenosis.  Carotid Doppler showed bilateral ICA bulb high-grade 80 to 99% stenosis. EF 30 to 35%. In 05/2016 her EF 20 to 25%. LDL 81, A1c pending, creatinine 3.07.    On exam, patient lying in bed, daughter at bedside, son on the phone.  Awake alert, orientated x3, no aphasia, mild dysarthria, follows simple commands, able to name and repeat.  No gaze palsy, visual field fall, no facial droop.  Bilateral upper and lower extremity equal strengths, sensation symmetrical, finger-to-nose intact bilaterally.  Gait not tested.  Etiology for patient stroke likely due to left ICA bulb high-grade stenosis, although cardiomyopathy with low EF is also possible.  Had a long discussion with patient, her daughter at bedside and her son over the phone, patient declined invasive carotid or cardiac procedure/surgery, prefer medical treatment at this time.  Check her orthostatic vitals with BP lying 130/58, sitting 118/55, standing 3 minutes 121/57.  Recommend BP goal 140-160 given bilateral ICA bulb high-grade stenosis.  Recommend continue follow-up with cardiology for cardiomyopathy, although improved from 4 years ago.  Currently on Coreg Lasix hydralazine Imdur and metolazone, BP stable at goal.  Will recommend aspirin 325 and Plavix DAPT for 3 months and then Plavix alone.  Continue statin Lipitor 40.  PT/OT recommend home health PT/OT.  Patient will follow up with stroke clinic at [facility] in 4 weeks.  For detailed assessment and plan, please refer to above as I have made changes wherever  appropriate.   Neurology will sign off. Please call with questions. Pt will follow up with stroke clinic NP at [facility] in about 4 weeks. Thanks for the consult.   Rosalin Hawking, MD PhD Stroke Neurology 09/24/2020 5:28 PM    Interval History   No acute events overnight, patient up in chair at bedside. Echo is here to do her echocardiogram. No family present.   Pertinent Lab Work and Imaging    09/23/20 CT Head WO IV Contrast 1. No evidence of acute intracranial abnormality. Moderate chronic small vessel ischemic disease with chronic infarcts as above.  09/24/20 MRI Brain WO IV Contrast There are a few small cortical/subcortical acute infarcts within the left parietal, occipital and posterior temporal lobes (PCA vascular territory). Possible additional punctate acute infarct within the posterior left frontal lobe (motor strip).  09/24/20 MR Angio Head 1. Negative for large vessel occlusion, but positive for a fetal Left PCA origin with multifocal moderate to severe Left P2 irregularity and stenosis, and a moderate stenosis of the Left ICA terminus distal to the Pcomm origin.   2. 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  09/24/20 Echocardiogram Complete  Results pending   09/24/20 CUS  Pending    Physical Examination   Constitutional: Calm, appropriate for condition  Cardiovascular: Normal RR Respiratory: No increased WOB   Mental status: AAOX4, following commands Speech: Fluent with repetition and naming intact, No dysarthria  Cranial nerves: EOMI, VFF, Face symmetric, Tongue midline  Motor: Normal bulk and tone. No drift.   Dlt Bic  Tri FgS Grp HF  KnF KnE PIF DoF  R '5 5 5 5 5 5 5 5 5 5  '$ L '5 5 5 5 5 5 5 5 5 5  '$ Sensory: Intact to light tough throughout  Coordination: Intact FNF  Gait: Deferred   NIHSS: 0   Assessment and Plan   Ms. JENNISE SELDERS is a 87 y.o. female w/pmh of f CHF, CKD, HTN, dilated  cardiomyopathy, cervical spondylosis and breast mass who presents with dysarthria and speech difficulty noted by her children. She was eligible for IVTPA but deferred on receiving it.   #Small Left Parietal Occipital Temporal lobe Stroke Patient presented with the symptoms described above.  MRI Brain did show small stroke to the left parietal occipital and temporal lobes. MRA Head was pertinent for left P2 stenosis and moderate steonsis of the LICA terminus distal to the Pcomm origin. Echo results are pending + CUS results. Stroke labs with LDL 81, A1C pending. OF note her prior echo in 2018 showed EF 20 to 25 %. Stroke etiology cryptogenic, possibly cadioembolic waiting on echo results.  - Follow up remaining stroke work up  - Continue Aspirin 325 mg QD + Atorvastatin 40 mg QD for stroke prevention  -At discharge please place ambulatory referral to neurology for stroke follow up   #Hypertension #CHF  She has a history of HTN and CHF. At home she takes Coreg 6.25 BID, Furosemide 40 mg BID, Hydralazine 25 mg BID, Imdur 30 mg QD and Metolazone 2.5 mg twice a week. Regarding her CHF history she was last seen in 2018 per chart review appears to have been lost to follow up.  - Follow up on echo results to see if EF has worsened  - Continue Coreg, Lasix, Hydralazine and Metolazone  - Currently pressures are trending 150-190 recommend permissive HTN for the first 24 to 28 hours after stroke then to gently lower blood pressure avoiding acute drops   #Hyperlipidemia From a stroke prevention stand point, the LDL goal is < 70. LDL is 81. Started on Atorvastatin 40 mg this admission   #Diabetes Screening  Hemoglobin A1C pending   Hospital day # 1  Ruta Hinds, NP  [facility] Nurse Practitioner Patient seen and discussed with attending physician Dr. Erlinda Hong         To contact Stroke Continuity provider, please refer to [email or web address] After hours, contact General Neurology

## 2020-09-24 NOTE — Evaluation (Signed)
Physical Therapy Evaluation Patient Details Name: FANIA ROOSE MRN: [id number] DOB: 1924-02-26 Today's Date: 09/24/2020   History of Present Illness  87 y/o female presented to ED on 6/14 for evaluation of sudden onset of dysarthria. CT head negative. MRI found a few small cortical/subcortical acute PCA infarcts, possible acute punctate infarct. PMH: CHF, CKD, HTN, dilated cardiomyopathy, cervical spondylosis, breast mass, chronic back pain  Clinical Impression  PTA, patient lives alone and was independent with RW for mobility. Patient currently functioning at min guard for OOB mobility with RW. Patient with complaints of LE spasms during mobility. Patient presents with generalized weakness, impaired balance, decreased activity tolerance. Patient seems to be at baseline cognition but no family/caregiver present to provide baseline functioning. Patient will benefit from skilled PT services during acute stay to address listed deficits. Recommend HHPT and 24 hour supervision initially at discharge to maximize functional mobility and safety.     Follow Up Recommendations Home health PT;Supervision/Assistance - 24 hour    Equipment Recommendations  None recommended by PT (patient owns necessary DME)    Recommendations for Other Services       Precautions / Restrictions Precautions Precautions: Fall Precaution Comments: chronic back pain, LE spasms Restrictions Weight Bearing Restrictions: No      Mobility  Bed Mobility Overal bed mobility: Needs Assistance Bed Mobility: Supine to Sit     Supine to sit: Supervision     General bed mobility comments: supervision for safety    Transfers Overall transfer level: Needs assistance Equipment used: Rolling Bralynn Donado (2 wheeled) Transfers: Sit to/from Stand Sit to Stand: Min guard         General transfer comment: min guard for safety, increased time to perform but no physical assist needed  Ambulation/Gait Ambulation/Gait  assistance: Min guard Gait Distance (Feet): 25 Feet Assistive device: Rolling Debar Plate (2 wheeled) Gait Pattern/deviations: Step-through pattern;Decreased stride length;Trunk flexed Gait velocity: decreased   General Gait Details: slow but steady gait. Min guard for safety due to potential LE spasm during ambulation. Trunk flexed throughout with cues for upright posture. Limited due to chronic back pain  Stairs            Wheelchair Mobility    Modified Rankin (Stroke Patients Only) Modified Rankin (Stroke Patients Only) Pre-Morbid Rankin Score: Slight disability Modified Rankin: Moderately severe disability     Balance Overall balance assessment: Needs assistance Sitting-balance support: No upper extremity supported;Feet supported Sitting balance-Leahy Scale: Good     Standing balance support: Bilateral upper extremity supported;During functional activity Standing balance-Leahy Scale: Poor Standing balance comment: reliant on UE support                             Pertinent Vitals/Pain Pain Assessment: Faces Faces Pain Scale: Hurts even more Pain Location: LE spasms, back Pain Descriptors / Indicators: Aching;Grimacing;Guarding;Spasm Pain Intervention(s): Monitored during session;Repositioned;Limited activity within patient's tolerance    Home Living Family/patient expects to be discharged to:: Private residence Living Arrangements: Alone Available Help at Discharge: Family;Available PRN/intermittently;Personal care attendant Type of Home: House Home Access: Level entry     Home Layout: One level Home Equipment: Jeri Rawlins - 2 wheels;Toilet riser;Cane - single point;Bedside commode;Grab bars - tub/shower      Prior Function Level of Independence: Independent with assistive device(s)         Comments: Ambulates with RW household distances. PCA 2x/week and does laundry/cleaning. Manages own medications and cooks     Hand Dominance  Extremity/Trunk Assessment   Upper Extremity Assessment Upper Extremity Assessment: Defer to OT evaluation    Lower Extremity Assessment Lower Extremity Assessment: Generalized weakness    Cervical / Trunk Assessment Cervical / Trunk Assessment: Kyphotic  Communication   Communication: No difficulties (patient reports at baseline with speech)  Cognition Arousal/Alertness: Awake/alert Behavior During Therapy: WFL for tasks assessed/performed Overall Cognitive Status: Within Functional Limits for tasks assessed                                        General Comments      Exercises     Assessment/Plan    PT Assessment Patient needs continued PT services  PT Problem List Decreased strength;Decreased activity tolerance;Decreased balance;Decreased mobility;Pain       PT Treatment Interventions DME instruction;Gait training;Functional mobility training;Therapeutic activities;Therapeutic exercise;Balance training;Patient/family education    PT Goals (Current goals can be found in the Care Plan section)  Acute Rehab PT Goals Patient Stated Goal: to go home PT Goal Formulation: With patient Time For Goal Achievement: 10/08/20 Potential to Achieve Goals: Fair    Frequency Min 4X/week   Barriers to discharge        Co-evaluation               AM-PAC PT "6 Clicks" Mobility  Outcome Measure Help needed turning from your back to your side while in a flat bed without using bedrails?: A Little Help needed moving from lying on your back to sitting on the side of a flat bed without using bedrails?: A Little Help needed moving to and from a bed to a chair (including a wheelchair)?: A Little Help needed standing up from a chair using your arms (e.g., wheelchair or bedside chair)?: A Little Help needed to walk in hospital room?: A Little Help needed climbing 3-5 steps with a railing? : A Little 6 Click Score: 18    End of Session Equipment Utilized During  Treatment: Gait belt Activity Tolerance: Patient tolerated treatment well Patient left: in bed (handoff to OT) Nurse Communication: Mobility status PT Visit Diagnosis: Unsteadiness on feet (R26.81);Muscle weakness (generalized) (M62.81)    Time: [id number] PT Time Calculation (min) (ACUTE ONLY): 26 min   Charges:   PT Evaluation $PT Eval Moderate Complexity: 1 Mod PT Treatments $Therapeutic Activity: 8-22 mins        Marlene Beidler A. Gilford Rile PT, DPT Acute Rehabilitation Services Pager [phone] Office [phone]   Linna Hoff 09/24/2020, 9:53 AM

## 2020-09-25 DIAGNOSIS — I10 Essential (primary) hypertension: Secondary | ICD-10-CM | POA: Diagnosis not present

## 2020-09-25 MED ORDER — ATORVASTATIN CALCIUM 40 MG PO TABS: 40.0000 mg | ORAL_TABLET | Freq: Every day | ORAL | 0 refills | Status: DC

## 2020-09-25 MED ORDER — FUROSEMIDE 40 MG PO TABS: 20.0000 mg | ORAL_TABLET | Freq: Two times a day (BID) | ORAL | Status: DC

## 2020-09-25 MED ORDER — ASPIRIN EC 81 MG PO TBEC: 81.0000 mg | DELAYED_RELEASE_TABLET | Freq: Every morning | ORAL | 0 refills | Status: AC

## 2020-09-25 MED ORDER — HYDRALAZINE HCL 25 MG PO TABS: 25.0000 mg | ORAL_TABLET | Freq: Two times a day (BID) | ORAL | 1 refills | Status: DC

## 2020-09-25 MED ORDER — CLOPIDOGREL BISULFATE 75 MG PO TABS: 75.0000 mg | ORAL_TABLET | Freq: Every day | ORAL | 0 refills | Status: AC

## 2020-09-25 NOTE — Progress Notes (Signed)
Pt discharged at this time.  Pt and daughter verbalize understanding of all discharge instructions including where to pick up prescriptions, follow-up appointment, and home health.  Has all belongings with her.  Discharged home with daughter.

## 2020-09-25 NOTE — TOC Transition Note (Signed)
Transition of Care [facility]) - CM/SW Discharge Note   Patient Details  Name: [name] MRN: [id number] Date of Birth: Oct 20, 1923  [facility] of [facility]) CM/SW Contact:  Pollie Friar, RN Phone Number: 09/25/2020, 1:53 PM   Clinical Narrative:    Patient is discharging home with [facility] services through [place]. Cory with [facility] aware of d/c home.  Pt has supervision at home and transportation to home.   Final next level of care: Home w Home Health Services Barriers to Discharge: No Barriers Identified   Patient Goals and CMS Choice   CMS Medicare.gov Compare Post Acute Care list provided to:: Patient Choice offered to / list presented to : Patient, Adult Children  Discharge Placement                       Discharge Plan and Services   Discharge Planning Services: CM Consult Post Acute Care Choice: Home Health                    HH Arranged: PT, OT [facility] Agency: [place] Date [place]: 09/24/20   Representative spoke with at [place]: [place] ([place]) Interventions     Readmission Risk Interventions No flowsheet data found.

## 2020-09-25 NOTE — Discharge Summary (Signed)
Physician Discharge Summary  [name] [id number] DOB: July 08, 1923 DOA: 09/23/2020  PCP: Rebecca Sine, MD  Admit date: 09/23/2020 Discharge date: 09/25/2020  Discharge disposition: Home with home health therapy   Recommendations for Outpatient Follow-Up:   Follow-up with PCP in 1 week. Follow-up with neurologist in 1 month   Discharge Diagnosis:   Principal Problem:   Acute CVA (cerebrovascular accident) ([name]) Active Problems:   CKD (chronic kidney disease) stage 4, GFR 15-29 ml/min (HCC)   Combined systolic and diastolic congestive heart failure, NYHA class 3 (HCC)   FTT (failure to thrive) in adult   Hyperkalemia    Discharge Condition: Stable.  Diet recommendation:  Diet Order             Diet - low sodium heart healthy           Diet Heart Room service appropriate? Yes; Fluid consistency: Thin  Diet effective now                     Code Status: DNR     Hospital Course:   Ms. [name] is a 87 year old woman with medical history significant for chronic systolic CHF with previous EF of 20 to 25%, dilated cardiomyopathy, hypertension, CKD stage IV, history of breast mass, chronic back pain, ventral hernia with previous repair, who presented to the hospital because of acute onset of slurred speech.  Initial CT head did not show any acute abnormality.  However, patient was outside of tPA window.  MRI brain revealed acute infarct within the left parietal occipital and posterior temporal lobes and possible additional punctate acute infarct in the posterior left frontal lobe.  The pattern of infarcts is suspicious for acute embolic stroke.  She also has bilateral ICA high-grade stenosis.    Neurologist was consulted.  Dual antiplatelet therapy with aspirin and Plavix for 3 months followed by Plavix monotherapy was recommended.  She was evaluated by PT and OT who recommended home health therapy.  Hospital course was complicated by brief episode of  confusion which was thought to be due to delirium.  Her condition has improved and she is deemed stable for discharge to home today.  Discharge plan was discussed with her daughter, [name], who verbalized understanding and agreed with the plan.   Medical Consultants:   Neurologist   Discharge Exam:    Vitals:   09/24/20 2319 09/25/20 0339 09/25/20 0757 09/25/20 1142  BP: (!) 184/83 (!) 149/73 140/72 136/82  Pulse:  75 73 71  Resp: 19 16    Temp:  97.8 F (36.6 C) 97.8 F (36.6 C) 97.8 F (36.6 C)  TempSrc:  Oral Oral Oral  SpO2:  98% 100% 100%  Weight:      Height:         GEN: NAD SKIN: Warm and dry EYES: EOMI, no pallor or icterus ENT: MMM CV: RRR PULM: CTA B ABD: soft, obese, NT, +BS CNS: AAO x 3, non focal EXT: No edema or tenderness   The results of significant diagnostics from this hospitalization (including imaging, microbiology, ancillary and laboratory) are listed below for reference.     Procedures and Diagnostic Studies:   MR ANGIO HEAD WO CONTRAST  Result Date: 09/24/2020 CLINICAL DATA:  87 year old female with slurred speech. Small cortical and white matter infarcts in the left hemisphere on MRI earlier today, most apparent in the left PCA territory. EXAM: MRA HEAD WITHOUT CONTRAST TECHNIQUE: Angiographic images of the Circle of Willis were  acquired using MRA technique without intravenous contrast. COMPARISON:  Brain MRI 0548 hours today. FINDINGS: No intracranial mass effect or ventriculomegaly. Antegrade flow in the posterior circulation with dominant left vertebral artery. Mild distal vertebral artery irregularity without significant stenosis. Bilateral PICA remain patent. Patent vertebrobasilar junction and basilar artery with mild irregularity but no significant stenosis. SCA and right PCA origins are patent. There is a fetal left PCA origin. Right posterior communicating artery is diminutive or absent. Multifocal moderate to severe irregularity and  stenosis of the left PCA P2 on series 255, image 8. Distal left PCA branch flow is maintained. Comparatively mild right PCA P2 irregularity. Distal right PCA branches are within normal limits. Antegrade flow in both ICA siphons. Long segment mild to moderate left siphon stenosis beginning at the anterior genu, with more pronounced moderate left ICA terminus stenosis distal to the patent left posterior communicating artery origin (series 252, image 12). The left ACA A1 is diminutive or absent. The left MCA M1 remains patent to the bifurcation without stenosis. Left MCA branches appear within normal limits. Contralateral right ICA siphon with only mild supraclinoid stenosis, but small 2 mm aneurysm of the anterior distal ICA on series 2, image 101. Right ICA terminus, right MCA and ACA origins are patent. Anterior communicating artery is within normal limits. Azygos type initial ACA A2 anatomy with a trifurcation and visible ACA branches within normal limits. Right MCA M1 segment and trifurcation are patent without stenosis. Visible right MCA branches are within normal limits. IMPRESSION: 1. Negative for large vessel occlusion, but positive for a fetal Left PCA origin with multifocal moderate to severe Left P2 irregularity and stenosis, and a moderate stenosis of the Left ICA terminus distal to the Pcomm origin. 2. 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 Electronically Signed   By: Genevie Ann M.D.   On: 09/24/2020 10:19   MR BRAIN WO CONTRAST  Result Date: 09/24/2020 CLINICAL DATA:  Stroke suspected. Additional provided: Slurred speech. EXAM: MRI HEAD WITHOUT CONTRAST TECHNIQUE: Multiplanar, multiecho pulse sequences of the brain and surrounding structures were obtained without intravenous contrast. COMPARISON:  Noncontrast head CT 09/23/2020.  Brain MRI 09/16/2019. FINDINGS: Brain: Mild-to-moderate generalized cerebral atrophy. Comparatively  mild cerebellar atrophy. There are a few small cortical/subcortical acute infarcts within the left parietal, occipital and posterior temporal lobes (PCA vascular territory). Possible additional punctate acute infarct within the posterior left frontal lobe (motor strip). Redemonstrated chronic cortically based infarcts within the right frontal and left occipital lobes. As before, there is a small amount of chronic hemosiderin deposition at site of the right frontal and left occipital lobe infarcts, as well as within the right occipital lobe. Moderate multifocal T2/FLAIR hyperintensity within the cerebral white matter and pons, nonspecific but compatible with chronic small vessel ischemic disease. Redemonstrated chronic lacunar infarct within the left thalamus. Redemonstrated small chronic infarcts within the bilateral cerebellar hemispheres. No evidence of intracranial mass. No extra-axial fluid collection. No midline shift. Vascular: Expected proximal arterial flow voids. Skull and upper cervical spine: No focal suspicious marrow lesion. Susceptibility artifact arising from hardware within the upper cervical spine. Sinuses/Orbits: Visualized orbits show no acute finding. Trace bilateral ethmoid sinus mucosal thickening. IMPRESSION: There are a few small cortical/subcortical acute infarcts within the left parietal, occipital and posterior temporal lobes (PCA vascular territory). Possible additional punctate acute infarct within the posterior left frontal lobe (motor strip). Otherwise stable non-contrast MRI appearance of the brain as  compared to 09/16/2019. Chronic cortically based infarcts within the right frontal and left occipital lobes. Chronic hemosiderin deposition at site of these infarcts and within the right occipital lobe. Moderate chronic small vessel ischemic changes within the cerebral white matter and pons. Chronic left thalamic lacunar infarct. Small chronic infarcts within the bilateral cerebellar  hemispheres. Electronically Signed   By: Kellie Simmering DO   On: 09/24/2020 08:20   DG Chest Portable 1 View  Result Date: 09/23/2020 CLINICAL DATA:  Code stroke.  New slurred speech. EXAM: PORTABLE CHEST 1 VIEW COMPARISON:  08/08/2016 FINDINGS: Heart size and pulmonary vascularity are normal for technique. Slight infiltration or atelectasis in the left lung base. No pleural effusions. No pneumothorax. Calcified and tortuous aorta. Degenerative changes in the spine and shoulders. IMPRESSION: Slight infiltration or atelectasis in the left base. Electronically Signed   By: Lucienne Capers M.D.   On: 09/23/2020 17:23   ECHOCARDIOGRAM COMPLETE  Result Date: 09/24/2020    ECHOCARDIOGRAM REPORT   Patient Name:   KAISHA CULLIGAN Date of Exam: 09/24/2020 Medical Rec #:  [id number]      Height:       62.0 in Accession #:    [id number]     Weight:       161.8 lb Date of Birth:  1924/02/10      BSA:          1.747 m Patient Age:    87 years       BP:           150/63 mmHg Patient Gender: F              HR:           67 bpm. Exam Location:  Inpatient Procedure: 2D Echo, Cardiac Doppler and Color Doppler Indications:    Stroke I63.9  History:        Patient has prior history of Echocardiogram examinations, most                 recent 06/09/2016. Cardiomyopathy; Risk Factors:Hypertension.  Sonographer:    Jonelle Sidle Dance Referring Phys: [place]  1. Left ventricular ejection fraction, by estimation, is 30 to 35%. The left ventricle has moderately decreased function. The left ventricle demonstrates global hypokinesis. Left ventricular diastolic parameters are consistent with Grade II diastolic dysfunction (pseudonormalization).  2. Right ventricular systolic function is normal. The right ventricular size is normal. There is normal pulmonary artery systolic pressure. The estimated right ventricular systolic pressure is 0000000 mmHg.  3. The aortic valve is tricuspid. There is moderate calcification of the  aortic valve. Aortic valve regurgitation is mild. Aortic valve sclerosis/calcification is present, without any evidence of aortic stenosis. Aortic regurgitation PHT measures 602 msec. Aortic valve mean gradient measures 7.3 mmHg.  4. The mitral valve is abnormal. Moderate mitral valve regurgitation. Moderate mitral annular calcification.  5. Left atrial size was moderately dilated.  6. The inferior vena cava is normal in size with greater than 50% respiratory variability, suggesting right atrial pressure of 3 mmHg. Comparison(s): A prior study was performed on 06/09/2014. Prior images reviewed side by side. LVEF has improved. FINDINGS  Left Ventricle: Left ventricular ejection fraction, by estimation, is 30 to 35%. The left ventricle has moderately decreased function. The left ventricle demonstrates global hypokinesis. The left ventricular internal cavity size was normal in size. There is no left ventricular hypertrophy. Left ventricular diastolic parameters are consistent with Grade II diastolic dysfunction (pseudonormalization). Right Ventricle: The  right ventricular size is normal. No increase in right ventricular wall thickness. Right ventricular systolic function is normal. There is normal pulmonary artery systolic pressure. The tricuspid regurgitant velocity is 2.58 m/s, and  with an assumed right atrial pressure of 3 mmHg, the estimated right ventricular systolic pressure is 0000000 mmHg. Left Atrium: Left atrial size was moderately dilated. Right Atrium: Right atrial size was normal in size. Pericardium: There is no evidence of pericardial effusion. Mitral Valve: The mitral valve is abnormal. Moderate mitral annular calcification. Moderate mitral valve regurgitation. MV peak gradient, 11.4 mmHg. The mean mitral valve gradient is 4.0 mmHg with average heart rate of 61 bpm. Tricuspid Valve: The tricuspid valve is normal in structure. Tricuspid valve regurgitation is trivial. No evidence of tricuspid stenosis.  Aortic Valve: The aortic valve is tricuspid. There is moderate calcification of the aortic valve. Aortic valve regurgitation is mild. Aortic regurgitation PHT measures 602 msec. Mild to moderate aortic valve sclerosis/calcification is present, without any evidence of aortic stenosis. Aortic valve mean gradient measures 7.3 mmHg. Aortic valve peak gradient measures 13.7 mmHg. Aortic valve area, by VTI measures 1.65 cm. Pulmonic Valve: The pulmonic valve was not well visualized. Pulmonic valve regurgitation is not visualized. No evidence of pulmonic stenosis. Aorta: The aortic root and ascending aorta are structurally normal, with no evidence of dilitation. Venous: The inferior vena cava is normal in size with greater than 50% respiratory variability, suggesting right atrial pressure of 3 mmHg. IAS/Shunts: The atrial septum is grossly normal.  LEFT VENTRICLE PLAX 2D LVIDd:         4.90 cm  Diastology LVIDs:         4.10 cm  LV e' medial:    3.26 cm/s LV PW:         1.00 cm  LV E/e' medial:  35.0 LV IVS:        1.10 cm  LV e' lateral:   5.00 cm/s LVOT diam:     2.10 cm  LV E/e' lateral: 22.8 LV SV:         71 LV SV Index:   41 LVOT Area:     3.46 cm  RIGHT VENTRICLE            IVC RV Basal diam:  2.70 cm    IVC diam: 1.40 cm RV S prime:     7.94 cm/s TAPSE (M-mode): 1.9 cm LEFT ATRIUM             Index       RIGHT ATRIUM           Index LA diam:        4.40 cm 2.52 cm/m  RA Area:     14.70 cm LA Vol (A2C):   63.5 ml 36.35 ml/m RA Volume:   37.20 ml  21.29 ml/m LA Vol (A4C):   69.7 ml 39.89 ml/m LA Biplane Vol: 70.2 ml 40.18 ml/m  AORTIC VALVE AV Area (Vmax):    1.79 cm AV Area (Vmean):   1.64 cm AV Area (VTI):     1.65 cm AV Vmax:           185.00 cm/s AV Vmean:          125.333 cm/s AV VTI:            0.431 m AV Peak Grad:      13.7 mmHg AV Mean Grad:      7.3 mmHg LVOT Vmax:  95.35 cm/s LVOT Vmean:        59.200 cm/s LVOT VTI:          0.206 m LVOT/AV VTI ratio: 0.48 AI PHT:            602 msec   AORTA Ao Root diam: 3.20 cm Ao Asc diam:  3.10 cm MITRAL VALVE                TRICUSPID VALVE MV Area (PHT): 1.98 cm     TR Peak grad:   26.6 mmHg MV Area VTI:   1.61 cm     TR Vmax:        258.00 cm/s MV Peak grad:  11.4 mmHg MV Mean grad:  4.0 mmHg     SHUNTS MV Vmax:       1.69 m/s     Systemic VTI:  0.21 m MV Vmean:      98.2 cm/s    Systemic Diam: 2.10 cm MV Decel Time: 384 msec MV E velocity: 114.00 cm/s MV A velocity: 177.00 cm/s MV E/A ratio:  0.64 Rudean Haskell MD Electronically signed by Rudean Haskell MD Signature Date/Time: 09/24/2020/1:28:02 PM    Final    CT HEAD CODE STROKE WO CONTRAST  Result Date: 09/23/2020 CLINICAL DATA:  Code stroke.  Right-sided weakness.  Slurred speech. EXAM: CT HEAD WITHOUT CONTRAST TECHNIQUE: Contiguous axial images were obtained from the base of the skull through the vertex without intravenous contrast. COMPARISON:  Head MRI 09/16/2019 FINDINGS: Brain: There is no evidence of an acute infarct, intracranial hemorrhage, mass, midline shift, or extra-axial fluid collection. Cerebral atrophy is within normal limits for age. Chronic cortical infarcts are again noted in the right frontal and left occipital lobes, and there is also an unchanged chronic lacunar infarct in the left thalamus. Patchy hypodensities in the cerebral white matter bilaterally are nonspecific but compatible with moderate chronic small vessel ischemic disease. Vascular: Calcified atherosclerosis at the skull base. No hyperdense vessel. Skull: No fracture or suspicious osseous lesion. Sinuses/Orbits: Visualized paranasal sinuses and mastoid air cells are clear. Bilateral cataract extraction. Other: None. ASPECTS [facility] Stroke Program Early CT Score) - Ganglionic level infarction (caudate, lentiform nuclei, internal capsule, insula, M1-M3 cortex): 7 - Supraganglionic infarction (M4-M6 cortex): 3 Total score (0-10 with 10 being normal): 10 IMPRESSION: 1. No evidence of acute intracranial  abnormality. 2. ASPECTS is 10. 3. Moderate chronic small vessel ischemic disease with chronic infarcts as above. These results were communicated to Dr. Cheral Marker at 3:55 pm on 09/23/2020 by text page via the [facility] messaging system. Electronically Signed   By: Logan Bores M.D.   On: 09/23/2020 15:55   VAS US CAROTID (at [facility] and [facility] only)  Result Date: 09/24/2020 Carotid Arterial Duplex Study Patient Name:  ENDEA YAEGER  Date of Exam:   09/24/2020 Medical Rec #: [id number]       Accession #:    [id number] Date of Birth: 1923/09/27       Patient Gender: F Patient Age:   70Y Exam Location:  [facility] Procedure:      VAS US CAROTID Referring Phys: [id number] Elwyn Reach --------------------------------------------------------------------------------  Indications:       CVA. Risk Factors:      Hypertension. Comparison Study:  no prior Performing Technologist: Archie Patten RVS  Examination Guidelines: A complete evaluation includes B-mode imaging, spectral Doppler, color Doppler, and power Doppler as needed of all accessible portions of each vessel. Bilateral testing is considered an integral  part of a complete examination. Limited examinations for reoccurring indications may be performed as noted.  Right Carotid Findings: +----------+-------+-------+--------+---------------------------------+--------+           PSV    EDV    StenosisPlaque Description               Comments           cm/s   cm/s                                                     +----------+-------+-------+--------+---------------------------------+--------+ CCA Prox  54     12             heterogenous                              +----------+-------+-------+--------+---------------------------------+--------+ CCA Distal49     16             heterogenous                              +----------+-------+-------+--------+---------------------------------+--------+ ICA Prox  450    187    80-99%  heterogenous,  calcific and                                                irregular                                 +----------+-------+-------+--------+---------------------------------+--------+ ICA Mid   368    198                                                      +----------+-------+-------+--------+---------------------------------+--------+ ICA Distal79     20                                                       +----------+-------+-------+--------+---------------------------------+--------+ ECA       46                                                              +----------+-------+-------+--------+---------------------------------+--------+ +----------+--------+-------+--------+-------------------+           PSV cm/sEDV cmsDescribeArm Pressure (mmHG) +----------+--------+-------+--------+-------------------+ [id number]                                         +----------+--------+-------+--------+-------------------+ +---------+--------+--+--------+-+---------+ VertebralPSV cm/s34EDV cm/s9Antegrade +---------+--------+--+--------+-+---------+  Left Carotid Findings: +----------+-------+-------+--------+---------------------------------+--------+           PSV  EDV    StenosisPlaque Description               Comments           cm/s   cm/s                                                     +----------+-------+-------+--------+---------------------------------+--------+ CCA Prox  69     12             heterogenous                              +----------+-------+-------+--------+---------------------------------+--------+ CCA Distal51     7              heterogenous                              +----------+-------+-------+--------+---------------------------------+--------+ ICA Prox  342    125    80-99%  heterogenous, calcific and                                                irregular                                  +----------+-------+-------+--------+---------------------------------+--------+ ICA Mid   54     13                                                       +----------+-------+-------+--------+---------------------------------+--------+ ICA Distal22     9                                                        +----------+-------+-------+--------+---------------------------------+--------+ ECA       83                                                              +----------+-------+-------+--------+---------------------------------+--------+ +----------+--------+--------+--------+-------------------+           PSV cm/sEDV cm/sDescribeArm Pressure (mmHG) +----------+--------+--------+--------+-------------------+ Subclavian50                                          +----------+--------+--------+--------+-------------------+ +---------+--------+--+--------+--+---------+ VertebralPSV cm/s30EDV cm/s15Antegrade +---------+--------+--+--------+--+---------+   Summary: Right Carotid: Velocities in the right ICA are consistent with a 80-99%                stenosis. Left Carotid: Velocities in the left ICA are consistent with a 80-99% stenosis. Vertebrals: Bilateral vertebral arteries  demonstrate antegrade flow. *See table(s) above for measurements and observations.  Electronically signed by Antony Contras MD on 09/24/2020 at 4:39:18 PM.    Final      Labs:   Basic Metabolic Panel: Recent Labs  Lab 09/23/20 1540 09/23/20 1546 09/24/20 0459  NA 136 136 138  K 5.9* 5.7* 4.8  CL 100 102 101  CO2 27  --  26  GLUCOSE 163* 159* 88  BUN 78* 72* 73*  CREATININE 3.38* 3.40* 3.07*  CALCIUM 8.3*  --  8.3*   GFR Estimated Creatinine Clearance: 10.3 mL/min (A) (by C-G formula based on SCr of 3.07 mg/dL (H)). Liver Function Tests: Recent Labs  Lab 09/23/20 1540 09/24/20 0459  AST 18 17  ALT 10 11  ALKPHOS 82 74  BILITOT 0.3 0.6  PROT 7.0 6.4*  ALBUMIN 3.4* 3.2*   No  results for input(s): LIPASE, AMYLASE in the last 168 hours. No results for input(s): AMMONIA in the last 168 hours. Coagulation profile Recent Labs  Lab 09/23/20 1540  INR 1.1    CBC: Recent Labs  Lab 09/23/20 1540 09/23/20 1546 09/24/20 0459  WBC 7.3  --  10.1  NEUTROABS 5.0  --   --   HGB 9.9* 11.2* 10.1*  HCT 31.0* 33.0* 30.0*  MCV 95.7  --  92.0  PLT 155  --  148*   Cardiac Enzymes: No results for input(s): CKTOTAL, CKMB, CKMBINDEX, TROPONINI in the last 168 hours. BNP: Invalid input(s): POCBNP CBG: Recent Labs  Lab 09/23/20 1539  GLUCAP 139*   D-Dimer No results for input(s): DDIMER in the last 72 hours. Hgb A1c Recent Labs    09/24/20 0459  HGBA1C 5.4   Lipid Profile Recent Labs    09/24/20 0459  CHOL 153  HDL 57  LDLCALC 81  TRIG 74  CHOLHDL 2.7   Thyroid function studies No results for input(s): TSH, T4TOTAL, T3FREE, THYROIDAB in the last 72 hours.  Invalid input(s): FREET3 Anemia work up No results for input(s): VITAMINB12, FOLATE, FERRITIN, TIBC, IRON, RETICCTPCT in the last 72 hours. Microbiology Recent Results (from the past 240 hour(s))  Resp Panel by RT-PCR (Flu A&B, Covid) Nasopharyngeal Swab     Status: None   Collection Time: 09/23/20  8:12 PM   Specimen: Nasopharyngeal Swab; Nasopharyngeal(NP) swabs in vial transport medium  Result Value Ref Range Status   SARS Coronavirus 2 by RT PCR NEGATIVE NEGATIVE Final    Comment: (NOTE) SARS-CoV-2 target nucleic acids are NOT DETECTED.  The SARS-CoV-2 RNA is generally detectable in upper respiratory specimens during the acute phase of infection. The lowest concentration of SARS-CoV-2 viral copies this assay can detect is 138 copies/mL. A negative result does not preclude SARS-Cov-2 infection and should not be used as the sole basis for treatment or other patient management decisions. A negative result may occur with  improper specimen collection/handling, submission of specimen  other than nasopharyngeal swab, presence of viral mutation(s) within the areas targeted by this assay, and inadequate number of viral copies(<138 copies/mL). A negative result must be combined with clinical observations, patient history, and epidemiological information. The expected result is Negative.  Fact Sheet for Patients:  EntrepreneurPulse.com.au  Fact Sheet for Healthcare Providers:  IncredibleEmployment.be  This test is no t yet approved or cleared by the [place] FDA and  has been authorized for detection and/or diagnosis of SARS-CoV-2 by FDA under an Emergency Use Authorization (EUA). This EUA will remain  in effect (meaning this test can be used)  for the duration of the COVID-19 declaration under Section 564(b)(1) of the Act, 21 U.S.C.section 360bbb-3(b)(1), unless the authorization is terminated  or revoked sooner.       Influenza A by PCR NEGATIVE NEGATIVE Final   Influenza B by PCR NEGATIVE NEGATIVE Final    Comment: (NOTE) The Xpert Xpress SARS-CoV-2/FLU/RSV plus assay is intended as an aid in the diagnosis of influenza from Nasopharyngeal swab specimens and should not be used as a sole basis for treatment. Nasal washings and aspirates are unacceptable for Xpert Xpress SARS-CoV-2/FLU/RSV testing.  Fact Sheet for Patients: EntrepreneurPulse.com.au  Fact Sheet for Healthcare Providers: IncredibleEmployment.be  This test is not yet approved or cleared by the [place] FDA and has been authorized for detection and/or diagnosis of SARS-CoV-2 by FDA under an Emergency Use Authorization (EUA). This EUA will remain in effect (meaning this test can be used) for the duration of the COVID-19 declaration under Section 564(b)(1) of the Act, 21 U.S.C. section 360bbb-3(b)(1), unless the authorization is terminated or revoked.  Performed at [facility], [place] [address] [place],  [place] [address]      Discharge Instructions:   Discharge Instructions     Ambulatory referral to Neurology   Complete by: As directed    Follow up with stroke clinic NP (Jessica Vanschaick or Cecille Rubin, if both not available, consider Zachery Dauer, or [place]) at [facility] in about 4 weeks. Thanks.   Diet - low sodium heart healthy   Complete by: As directed    Discharge instructions   Complete by: As directed    Take aspirin and Plavix for 90 days and then stop aspirin after 90 days and continue with Plavix thereafter.   Face-to-face encounter (required for Medicare/Medicaid patients)   Complete by: As directed    I Lenin Kuhnle certify that this patient is under my care and that I, or a nurse practitioner or physician's assistant working with me, had a face-to-face encounter that meets the physician face-to-face encounter requirements with this patient on 09/25/2020. The encounter with the patient was in whole, or in part for the following medical condition(s) which is the primary reason for home health care (List medical condition): Stroke   The encounter with the patient was in whole, or in part, for the following medical condition, which is the primary reason for home health care: Stroke   I certify that, based on my findings, the following services are medically necessary home health services: Physical therapy   Reason for Medically Necessary Home Health Services: Therapy- Personnel officer, Public librarian   My clinical findings support the need for the above services: Unsafe ambulation due to balance issues   Further, I certify that my clinical findings support that this patient is homebound due to: Unsafe ambulation due to balance issues   Home Health   Complete by: As directed    To provide the following care/treatments:  PT OT     Increase activity slowly   Complete by: As directed       Allergies as of 09/25/2020   No Known Allergies      Medication List      TAKE these medications    acetaminophen 325 MG tablet Commonly known as: TYLENOL Take 650 mg by mouth every 6 (six) hours as needed for mild pain.   allopurinol 100 MG tablet Commonly known as: ZYLOPRIM Take 100 mg by mouth daily.   aspirin EC 81 MG tablet Take 1 tablet (81  mg total) by mouth every morning.   atorvastatin 40 MG tablet Commonly known as: LIPITOR Take 1 tablet (40 mg total) by mouth daily.   calcitRIOL 0.5 MCG capsule Commonly known as: ROCALTROL Take 0.5 mcg by mouth daily.   carvedilol 6.25 MG tablet Commonly known as: COREG Take 1 tablet (6.25 mg total) by mouth 2 (two) times daily with a meal. Please schedule an appointment for further refills What changed: additional instructions   clopidogrel 75 MG tablet Commonly known as: PLAVIX Take 1 tablet (75 mg total) by mouth daily.   felodipine 10 MG 24 hr tablet Commonly known as: PLENDIL Take 10 mg by mouth daily.   furosemide 40 MG tablet Commonly known as: LASIX Take 0.5-1 tablets (20-40 mg total) by mouth 2 (two) times daily. Take 40 mg in the morning and 20 mg in afternoon, may take extra 20 mg dose in the afternoon as needed   gabapentin 300 MG capsule Commonly known as: NEURONTIN Take 300 mg by mouth at bedtime.   hydrALAZINE 25 MG tablet Commonly known as: APRESOLINE Take 1 tablet (25 mg total) by mouth 2 (two) times daily. NEED OV.   isosorbide mononitrate 30 MG 24 hr tablet Commonly known as: IMDUR TAKE 1 TABLET BY MOUTH EVERY DAY   Potassium Chloride ER 20 MEQ Tbcr Take 20 mEq by mouth daily.   PRESERVISION AREDS PO Take 1 capsule by mouth daily.        Follow-up Information     Care, [facility] Follow up.   Specialty: Home Health Services Why: The home health agency will contact you for the first home visit. Contact information: [place] [phone]         [facility]. Schedule an appointment as  soon as possible for a visit in 1 month(s).   Specialty: Neurology Contact information: [place] [phone]                 Time coordinating discharge: 32 minutes  Signed:  Jennye Boroughs  [facility] 09/25/2020, 1:29 PM   Pager on [email or web address]. If 7PM-7AM, please contact night-coverage at [email or web address]

## 2020-09-25 NOTE — Care Management Obs Status (Signed)
[facility] NOTIFICATION   Patient Details  Name: [name] MRN: [id number] Date of Birth: [birth year]   Medicare Observation Status Notification Given:  Yes    Pollie Friar, RN 09/25/2020, 1:29 PM

## 2020-09-25 NOTE — Care Management CC44 (Signed)
Condition Code 44 Documentation Completed  Patient Details  Name: [name] MRN: [id number] Date of Birth: 1923-12-22   Condition Code 44 given:  Yes Patient signature on Condition Code 44 notice:  Yes Documentation of 2 MD's agreement:  Yes Code 44 added to claim:  Yes    Pollie Friar, RN 09/25/2020, 1:29 PM

## 2020-09-25 NOTE — Evaluation (Signed)
Speech Language Pathology Evaluation Patient Details Name: [name] MRN: [id number] DOB: December 12, 1923 Today's Date: 09/25/2020 Time: [id number] SLP Time Calculation (min) (ACUTE ONLY): 25 min  Problem List:  Patient Active Problem List   Diagnosis Date Noted   Acute CVA (cerebrovascular accident) ([place]) 09/23/2020   Hyperkalemia 09/23/2020   Hypertensive disorder 11/14/2017   Goals of care, counseling/discussion    Palliative care by specialist    Acute on chronic systolic heart failure, NYHA class 3 ([place]) 08/07/2016   Acute kidney injury ([place]) 08/07/2016   FTT (failure to thrive) in adult 08/07/2016   Dilated cardiomyopathy ([place]) 07/19/2016   Combined systolic and diastolic congestive heart failure, NYHA class 3 ([place]) 07/19/2016   Anemia 06/08/2016   Ventral hernia 09/15/2013   CKD (chronic kidney disease) stage 4, GFR 15-29 ml/min ([facility]) 09/15/2013   Acute exacerbation of CHF (congestive heart failure) ([place]) 09/14/2013   Partial bowel obstruction ([place]) 09/13/2013   Past Medical History:  Past Medical History:  Diagnosis Date   Breast mass    Cervical spondylosis    Dilated cardiomyopathy ([place]) 05/2016   Mild LVH with EF of 20 1125%. Hypokinesis with inferior akinesis. GR 1 DD noted with elevated LVEDP. Moderate LA dilation. Moderate mitral regurgitation. Aortic valve aortic valve calcification/sclerosis without stenosis.   Hypertension    Past Surgical History:  Past Surgical History:  Procedure Laterality Date   APPENDECTOMY     BACK SURGERY     x 4   CATARACT EXTRACTION     KNEE SURGERY     x 2   TRANSTHORACIC ECHOCARDIOGRAM  05/2016   Mild LVH with EF of 20 1125%. Hypokinesis with inferior akinesis. GR 1 DD noted with elevated LVEDP. Moderate LA dilation. Moderate mitral regurgitation. Aortic valve aortic valve calcification/sclerosis without stenosis.   VENTRAL HERNIA REPAIR N/A 09/14/2013   Procedure: LAPAROSCOPIC ventral HERNIA REPAIR with mesh ;  Surgeon: Shann Medal, MD;  Location: [facility] ORS;  Service: General;  Laterality: N/A;   HPI:  87 y/o female presented to ED on 6/14 for evaluation of sudden onset of dysarthria. CT head negative. MRI found a few small cortical/subcortical acute PCA infarcts, possible acute punctate infarct. PMH: CHF, CKD, HTN, dilated cardiomyopathy, cervical spondylosis, breast mass, chronic back pain   Assessment / Plan / Recommendation Clinical Impression  Pt presents with deficits in cognition as evidenced by administration of [employer] Mental Status 14/30. No family member or caregiver present to determine if this is a change from baseline status. PLOF pt very high level reporting independence with cooking tasks, medicine management, and financial management. She states son and daughter live locally and can assist with any needs at [place]. Pt oriented to date, but exhibited some confusion when asked where she was at and reported she was here visiting others. Deficits exhibited during assessment included reduced executive function skills, decreased delayed recall 2/5, and reduced mental manipulation. Attempted to contact family via phone this date, unsuccessfully, to clarify current mentation vs prior to admission. SLP to follow during acute stay to clarify baseline cognitive status. Receptive and expressive language as well as motor speech skills appeared intact.    SLP Assessment  SLP Recommendation/Assessment: Patient needs continued Speech Lanaguage Pathology Services SLP Visit Diagnosis: Cognitive communication deficit (R41.841)    Follow Up Recommendations  24 hour supervision/assistance    Frequency and Duration min 1 x/week  1 week      SLP Evaluation Cognition  Overall Cognitive Status: Difficult to assess (  no caregiver or family present; unable to reach via phone x1 attempt) Arousal/Alertness: Awake/alert Orientation Level: Oriented to person;Oriented to time;Disoriented to situation;Disoriented to  time Attention: Focused;Sustained Focused Attention: Impaired Focused Attention Impairment: Verbal complex;Functional complex Sustained Attention: Impaired Sustained Attention Impairment: Verbal complex;Functional complex Memory: Impaired Memory Impairment: Decreased recall of new information;Decreased short term memory Decreased Short Term Memory: Verbal complex;Functional complex Awareness: Impaired Problem Solving: Impaired Problem Solving Impairment: Verbal complex;Functional complex Executive Function: Self Monitoring;Organizing Organizing: Impaired Self Monitoring: Impaired Safety/Judgment: Impaired       Comprehension  Auditory Comprehension Overall Auditory Comprehension: Appears within functional limits for tasks assessed    Expression Expression Primary Mode of Expression: Verbal Verbal Expression Overall Verbal Expression: Appears within functional limits for tasks assessed Written Expression Dominant Hand: Right   Oral / Motor  Oral Motor/Sensory Function Overall Oral Motor/Sensory Function: Within functional limits Motor Speech Overall Motor Speech: Appears within functional limits for tasks assessed   GO                    Hayden Rasmussen MA, CCC-SLP Acute Rehabilitation Services   09/25/2020, 11:36 AM

## 2020-09-27 ENCOUNTER — Emergency Department (HOSPITAL_COMMUNITY): Payer: Medicare PPO

## 2020-09-27 ENCOUNTER — Observation Stay (HOSPITAL_COMMUNITY)
Admission: EM | Admit: 2020-09-27 | Discharge: 2020-09-29 | Disposition: A | Discharge status: Home / Self Care | Payer: Medicare PPO | Attending: Internal Medicine | Admitting: Internal Medicine

## 2020-09-27 ENCOUNTER — Encounter (HOSPITAL_COMMUNITY): Payer: Self-pay

## 2020-09-27 DIAGNOSIS — R531 Weakness: Secondary | ICD-10-CM | POA: Diagnosis present

## 2020-09-27 DIAGNOSIS — I130 Hypertensive heart and chronic kidney disease with heart failure and stage 1 through stage 4 chronic kidney disease, or unspecified chronic kidney disease: Secondary | ICD-10-CM | POA: Insufficient documentation

## 2020-09-27 DIAGNOSIS — N184 Chronic kidney disease, stage 4 (severe): Secondary | ICD-10-CM | POA: Insufficient documentation

## 2020-09-27 DIAGNOSIS — N390 Urinary tract infection, site not specified: Secondary | ICD-10-CM | POA: Insufficient documentation

## 2020-09-27 DIAGNOSIS — Y900 Blood alcohol level of less than 20 mg/100 ml: Secondary | ICD-10-CM | POA: Diagnosis not present

## 2020-09-27 DIAGNOSIS — Z8673 Personal history of transient ischemic attack (TIA), and cerebral infarction without residual deficits: Secondary | ICD-10-CM | POA: Insufficient documentation

## 2020-09-27 DIAGNOSIS — D631 Anemia in chronic kidney disease: Secondary | ICD-10-CM | POA: Insufficient documentation

## 2020-09-27 DIAGNOSIS — N179 Acute kidney failure, unspecified: Secondary | ICD-10-CM

## 2020-09-27 DIAGNOSIS — R7989 Other specified abnormal findings of blood chemistry: Secondary | ICD-10-CM | POA: Insufficient documentation

## 2020-09-27 DIAGNOSIS — I5042 Chronic combined systolic (congestive) and diastolic (congestive) heart failure: Secondary | ICD-10-CM | POA: Insufficient documentation

## 2020-09-27 DIAGNOSIS — I6330 Cerebral infarction due to thrombosis of unspecified cerebral artery: Secondary | ICD-10-CM | POA: Diagnosis not present

## 2020-09-27 DIAGNOSIS — I6930 Unspecified sequelae of cerebral infarction: Secondary | ICD-10-CM | POA: Diagnosis not present

## 2020-09-27 DIAGNOSIS — Z87891 Personal history of nicotine dependence: Secondary | ICD-10-CM | POA: Diagnosis not present

## 2020-09-27 DIAGNOSIS — Z7982 Long term (current) use of aspirin: Secondary | ICD-10-CM | POA: Insufficient documentation

## 2020-09-27 DIAGNOSIS — R262 Difficulty in walking, not elsewhere classified: Secondary | ICD-10-CM | POA: Insufficient documentation

## 2020-09-27 DIAGNOSIS — G459 Transient cerebral ischemic attack, unspecified: Secondary | ICD-10-CM

## 2020-09-27 DIAGNOSIS — Z20822 Contact with and (suspected) exposure to covid-19: Secondary | ICD-10-CM | POA: Insufficient documentation

## 2020-09-27 DIAGNOSIS — Z79899 Other long term (current) drug therapy: Secondary | ICD-10-CM | POA: Insufficient documentation

## 2020-09-27 DIAGNOSIS — R7401 Elevation of levels of liver transaminase levels: Secondary | ICD-10-CM | POA: Diagnosis not present

## 2020-09-27 LAB — I-STAT CHEM 8, ED
BUN: 129 mg/dL — ABNORMAL HIGH (ref 8–23)
Calcium, Ion: 0.97 mmol/L — ABNORMAL LOW (ref 1.15–1.40)
Chloride: 105 mmol/L (ref 98–111)
Creatinine, Ser: 4.1 mg/dL — ABNORMAL HIGH (ref 0.44–1.00)
Glucose, Bld: 181 mg/dL — ABNORMAL HIGH (ref 70–99)
HCT: 32 % — ABNORMAL LOW (ref 36.0–46.0)
Hemoglobin: 10.9 g/dL — ABNORMAL LOW (ref 12.0–15.0)
Potassium: 7.8 mmol/L (ref 3.5–5.1)
Sodium: 136 mmol/L (ref 135–145)
TCO2: 30 mmol/L (ref 22–32)

## 2020-09-27 LAB — CBC
HCT: 31.3 % — ABNORMAL LOW (ref 36.0–46.0)
Hemoglobin: 10 g/dL — ABNORMAL LOW (ref 12.0–15.0)
MCH: 30.6 pg (ref 26.0–34.0)
MCHC: 31.9 g/dL (ref 30.0–36.0)
MCV: 95.7 fL (ref 80.0–100.0)
Platelets: 167 10*3/uL (ref 150–400)
RBC: 3.27 MIL/uL — ABNORMAL LOW (ref 3.87–5.11)
RDW: 16.3 % — ABNORMAL HIGH (ref 11.5–15.5)
WBC: 7.9 10*3/uL (ref 4.0–10.5)
nRBC: 0 % (ref 0.0–0.2)

## 2020-09-27 LAB — DIFFERENTIAL
Abs Immature Granulocytes: 0.04 10*3/uL (ref 0.00–0.07)
Basophils Absolute: 0 10*3/uL (ref 0.0–0.1)
Basophils Relative: 0 %
Eosinophils Absolute: 0.2 10*3/uL (ref 0.0–0.5)
Eosinophils Relative: 2 %
Immature Granulocytes: 1 %
Lymphocytes Relative: 22 %
Lymphs Abs: 1.7 10*3/uL (ref 0.7–4.0)
Monocytes Absolute: 0.6 10*3/uL (ref 0.1–1.0)
Monocytes Relative: 8 %
Neutro Abs: 5.3 10*3/uL (ref 1.7–7.7)
Neutrophils Relative %: 67 %

## 2020-09-27 LAB — COMPREHENSIVE METABOLIC PANEL
ALT: 29 U/L (ref 0–44)
AST: 54 U/L — ABNORMAL HIGH (ref 15–41)
Albumin: 3.1 g/dL — ABNORMAL LOW (ref 3.5–5.0)
Alkaline Phosphatase: 80 U/L (ref 38–126)
Anion gap: 9 (ref 5–15)
BUN: 92 mg/dL — ABNORMAL HIGH (ref 8–23)
CO2: 27 mmol/L (ref 22–32)
Calcium: 8.1 mg/dL — ABNORMAL LOW (ref 8.9–10.3)
Chloride: 102 mmol/L (ref 98–111)
Creatinine, Ser: 4.02 mg/dL — ABNORMAL HIGH (ref 0.44–1.00)
GFR, Estimated: 10 mL/min — ABNORMAL LOW (ref >60)
Glucose, Bld: 177 mg/dL — ABNORMAL HIGH (ref 70–99)
Potassium: 4.6 mmol/L (ref 3.5–5.1)
Sodium: 138 mmol/L (ref 135–145)
Total Bilirubin: 0.7 mg/dL (ref 0.3–1.2)
Total Protein: 6.2 g/dL — ABNORMAL LOW (ref 6.5–8.1)

## 2020-09-27 LAB — APTT: aPTT: 24 seconds (ref 24–36)

## 2020-09-27 LAB — TROPONIN I (HIGH SENSITIVITY): Troponin I (High Sensitivity): 88 ng/L — ABNORMAL HIGH (ref <18)

## 2020-09-27 LAB — PROTIME-INR
INR: 1.1 (ref 0.8–1.2)
Prothrombin Time: 14.7 seconds (ref 11.4–15.2)

## 2020-09-27 LAB — BRAIN NATRIURETIC PEPTIDE: B Natriuretic Peptide: 111.2 pg/mL — ABNORMAL HIGH (ref 0.0–100.0)

## 2020-09-27 LAB — ETHANOL: Alcohol, Ethyl (B): <10 mg/dL (ref <10)

## 2020-09-27 LAB — CBG MONITORING, ED: Glucose-Capillary: 190 mg/dL — ABNORMAL HIGH (ref 70–99)

## 2020-09-27 MED ORDER — SODIUM CHLORIDE 0.9 % IV BOLUS
500.0000 mL | Freq: Once | INTRAVENOUS | Status: AC
  Administered 2020-09-27: 500 mL via INTRAVENOUS

## 2020-09-27 MED ORDER — ASPIRIN 81 MG PO CHEW
81.0000 mg | CHEWABLE_TABLET | Freq: Once | ORAL | Status: AC
  Administered 2020-09-27: 81 mg via ORAL
  Filled 2020-09-27: qty 1

## 2020-09-27 NOTE — ED Provider Notes (Signed)
[facility] EMERGENCY DEPARTMENT Provider Note   CSN: [id number] Arrival date & time: 09/27/20  2033  An emergency department physician performed an initial assessment on this suspected stroke patient at 2040.  History Chief Complaint  Patient presents with   Code Stroke    [name] is a 87 y.o. female.  The history is provided by the patient, the EMS personnel and medical records.  Neurologic Problem This is a new problem. The current episode started 1 to 2 hours ago. The problem occurs constantly. The problem has been gradually improving. Pertinent negatives include no chest pain, no abdominal pain, no headaches and no shortness of breath. Nothing aggravates the symptoms. Nothing relieves the symptoms. She has tried nothing for the symptoms.   Patient is a 87 year old female with a complicated past medical history including recent admission for acute CVA and discharged 2 days ago who presents to the emergency department via EMS after family found the patient with right-sided facial droop and there was reported right-sided weakness.  EMS reported hypotension on their arrival with blood pressure 80/40 that improved after 400 cc of crystalloid. Patient arrived as a code stroke and I evaluated her in the [facility].  She had no complaints at the time of my exam.     Past Medical History:  Diagnosis Date   Breast mass    Cervical spondylosis    Dilated cardiomyopathy ([place]) 05/2016   Mild LVH with EF of 20 1125%. Hypokinesis with inferior akinesis. GR 1 DD noted with elevated LVEDP. Moderate LA dilation. Moderate mitral regurgitation. Aortic valve aortic valve calcification/sclerosis without stenosis.   Hypertension     Patient Active Problem List   Diagnosis Date Noted   Acute right-sided weakness 09/27/2020   Acute CVA (cerebrovascular accident) ([place]) 09/23/2020   Hyperkalemia 09/23/2020   Hypertensive disorder 11/14/2017   Goals of care,  counseling/discussion    Palliative care by specialist    Acute on chronic systolic heart failure, NYHA class 3 ([place]) 08/07/2016   Acute kidney injury ([place]) 08/07/2016   FTT (failure to thrive) in adult 08/07/2016   Dilated cardiomyopathy ([place]) 07/19/2016   Combined systolic and diastolic congestive heart failure, NYHA class 3 ([place]) 07/19/2016   Anemia 06/08/2016   Ventral hernia 09/15/2013   CKD (chronic kidney disease) stage 4, GFR 15-29 ml/min (HCC) 09/15/2013   Acute exacerbation of CHF (congestive heart failure) ([place]) 09/14/2013   Partial bowel obstruction ([place]) 09/13/2013    Past Surgical History:  Procedure Laterality Date   APPENDECTOMY     BACK SURGERY     x 4   CATARACT EXTRACTION     KNEE SURGERY     x 2   TRANSTHORACIC ECHOCARDIOGRAM  05/2016   Mild LVH with EF of 20 1125%. Hypokinesis with inferior akinesis. GR 1 DD noted with elevated LVEDP. Moderate LA dilation. Moderate mitral regurgitation. Aortic valve aortic valve calcification/sclerosis without stenosis.   VENTRAL HERNIA REPAIR N/A 09/14/2013   Procedure: LAPAROSCOPIC ventral HERNIA REPAIR with mesh ;  Surgeon: Shann Medal, MD;  Location: [facility] ORS;  Service: General;  Laterality: N/A;     OB History   No obstetric history on file.     Family History  Problem Relation Age of Onset   Diabetes Mother    Hypertension Mother    Cancer Father        uknown kind of cancer   Diabetes Sister     Social History   Tobacco Use  Smoking status: Former    Pack years: 0.00    Types: Cigarettes   Smokeless tobacco: Never  Substance Use Topics   Alcohol use: No   Drug use: No    Home Medications Prior to Admission medications   Medication Sig Start Date End Date Taking? Authorizing Provider  acetaminophen (TYLENOL) 325 MG tablet Take 650 mg by mouth every 6 (six) hours as needed for mild pain.   Yes [provider]  allopurinol (ZYLOPRIM) 100 MG tablet Take 100 mg by mouth daily.  09/26/13  Yes  [provider]  aspirin EC 81 MG tablet Take 1 tablet (81 mg total) by mouth every morning. 09/25/20 12/24/20 Yes Jennye Boroughs, MD  atorvastatin (LIPITOR) 40 MG tablet Take 1 tablet (40 mg total) by mouth daily. 09/25/20  Yes Jennye Boroughs, MD  calcitRIOL (ROCALTROL) 0.5 MCG capsule Take 0.5 mcg by mouth daily.   Yes [provider]  carvedilol (COREG) 6.25 MG tablet Take 1 tablet (6.25 mg total) by mouth 2 (two) times daily with a meal. Please schedule an appointment for further refills 12/19/17  Yes Troy Sine, MD  clopidogrel (PLAVIX) 75 MG tablet Take 1 tablet (75 mg total) by mouth daily. 09/25/20 09/25/21 Yes Jennye Boroughs, MD  felodipine (PLENDIL) 10 MG 24 hr tablet Take 10 mg by mouth daily.   Yes [provider]  furosemide (LASIX) 40 MG tablet Take 0.5-1 tablets (20-40 mg total) by mouth 2 (two) times daily. Take 40 mg in the morning and 20 mg in afternoon, may take extra 20 mg dose in the afternoon as needed Patient taking differently: Take 20-40 mg by mouth See admin instructions. Take 40 mg in the morning and 20 mg in afternoon, may take extra 20 mg dose in the afternoon as needed for fluid 09/25/20  Yes Jennye Boroughs, MD  gabapentin (NEURONTIN) 300 MG capsule Take 300 mg by mouth at bedtime.   Yes [provider]  hydrALAZINE (APRESOLINE) 25 MG tablet Take 1 tablet (25 mg total) by mouth 2 (two) times daily. NEED OV. 09/25/20  Yes Jennye Boroughs, MD  isosorbide mononitrate (IMDUR) 30 MG 24 hr tablet TAKE 1 TABLET BY MOUTH EVERY DAY Patient taking differently: Take 30 mg by mouth daily. 01/04/18  Yes Leonie Man, MD  Multiple Vitamins-Minerals (PRESERVISION AREDS PO) Take 1 capsule by mouth daily.    Yes [provider]  potassium chloride 20 MEQ TBCR Take 20 mEq by mouth daily. Patient not taking: Reported on 09/27/2020 08/09/16   Thurnell Lose, MD    Allergies    Patient has no known allergies.  Review of Systems   Review of  Systems  Constitutional:  Positive for activity change and appetite change. Negative for chills and fever.  HENT:  Negative for ear pain and sore throat.   Eyes:  Negative for pain and visual disturbance.  Respiratory:  Negative for cough and shortness of breath.   Cardiovascular:  Negative for chest pain and palpitations.  Gastrointestinal:  Negative for abdominal pain and vomiting.  Genitourinary:  Negative for dysuria and hematuria.  Musculoskeletal:  Negative for arthralgias and back pain.  Skin:  Negative for color change and rash.  Neurological:  Positive for facial asymmetry and weakness. Negative for seizures, syncope and headaches.  All other systems reviewed and are negative.  Physical Exam Updated Vital Signs BP 116/61   Pulse 72   Temp 98.6 F (37 C) (Oral)   Resp (!) 24  Wt 70.1 kg   SpO2 100%   BMI 28.26 kg/m   Physical Exam Vitals and nursing note reviewed.  Constitutional:      General: She is not in acute distress.    Appearance: She is well-developed and overweight. She is ill-appearing (chronically).  HENT:     Head: Normocephalic and atraumatic.  Eyes:     General: No visual field deficit.    Extraocular Movements: Extraocular movements intact.     Conjunctiva/sclera: Conjunctivae normal.     Pupils: Pupils are equal.     Right eye: Pupil is round and reactive.     Left eye: Pupil is round and reactive.  Cardiovascular:     Rate and Rhythm: Normal rate and regular rhythm.     Pulses:          Radial pulses are 2+ on the right side and 2+ on the left side.       Dorsalis pedis pulses are 1+ on the right side and 1+ on the left side.     Heart sounds: No murmur heard. Pulmonary:     Effort: Pulmonary effort is normal. No respiratory distress.     Breath sounds: Normal breath sounds.  Abdominal:     General: Abdomen is flat.     Palpations: Abdomen is soft.     Tenderness: There is no abdominal tenderness.  Musculoskeletal:     Cervical back:  Full passive range of motion without pain, normal range of motion and neck supple.     Right lower leg: 1+ Pitting Edema present.     Left lower leg: 1+ Pitting Edema present.  Skin:    General: Skin is warm and dry.  Neurological:     Mental Status: She is alert.     GCS: GCS eye subscore is 4. GCS verbal subscore is 5. GCS motor subscore is 6.     Cranial Nerves: Dysarthria present. No cranial nerve deficit or facial asymmetry.     Sensory: Sensation is intact.     Motor: Motor function is intact.     Coordination: Coordination is intact.     Comments:  Mildly dysarthric. No apparent aphasia.      ED Results / Procedures / Treatments   Labs (all labs ordered are listed, but only abnormal results are displayed) Labs Reviewed  CBC - Abnormal; Notable for the following components:      Result Value   RBC 3.27 (*)    Hemoglobin 10.0 (*)    HCT 31.3 (*)    RDW 16.3 (*)    All other components within normal limits  COMPREHENSIVE METABOLIC PANEL - Abnormal; Notable for the following components:   Glucose, Bld 177 (*)    BUN 92 (*)    Creatinine, Ser 4.02 (*)    Calcium 8.1 (*)    Total Protein 6.2 (*)    Albumin 3.1 (*)    AST 54 (*)    GFR, Estimated 10 (*)    All other components within normal limits  BRAIN NATRIURETIC PEPTIDE - Abnormal; Notable for the following components:   B Natriuretic Peptide 111.2 (*)    All other components within normal limits  I-STAT CHEM 8, ED - Abnormal; Notable for the following components:   Potassium 7.8 (*)    BUN 129 (*)    Creatinine, Ser 4.10 (*)    Glucose, Bld 181 (*)    Calcium, Ion 0.97 (*)    Hemoglobin 10.9 (*)  HCT 32.0 (*)    All other components within normal limits  CBG MONITORING, ED - Abnormal; Notable for the following components:   Glucose-Capillary 190 (*)    All other components within normal limits  TROPONIN I (HIGH SENSITIVITY) - Abnormal; Notable for the following components:   Troponin I (High Sensitivity) 88  (*)    All other components within normal limits  RESP PANEL BY RT-PCR (FLU A&B, COVID) ARPGX2  ETHANOL  PROTIME-INR  APTT  DIFFERENTIAL  RAPID URINE DRUG SCREEN, HOSP PERFORMED  URINALYSIS, ROUTINE W REFLEX MICROSCOPIC  COMPREHENSIVE METABOLIC PANEL  BASIC METABOLIC PANEL  TROPONIN I (HIGH SENSITIVITY)    EKG None  Radiology DG Chest Portable 1 View  Result Date: 09/27/2020 CLINICAL DATA:  87 year old with confusion. EXAM: PORTABLE CHEST 1 VIEW COMPARISON:  09/23/2020 FINDINGS: Stable upper normal heart size. Unchanged mediastinal contours with aortic atherosclerosis. Smaller patchy opacity at the left lung base with possible small left pleural effusion. No new or acute airspace disease. No pulmonary edema or pneumothorax. Bones under mineralized without acute osseous abnormality. IMPRESSION: Mild patchy opacity at the left lung base, unchanged from prior exam, atelectasis versus pneumonia. Possible small left pleural effusion. Electronically Signed   By: Keith Rake M.D.   On: 09/27/2020 22:08   CT HEAD CODE STROKE WO CONTRAST  Result Date: 09/27/2020 CLINICAL DATA:  Code stroke.  Right facial droop and weakness EXAM: CT HEAD WITHOUT CONTRAST TECHNIQUE: Contiguous axial images were obtained from the base of the skull through the vertex without intravenous contrast. COMPARISON:  None. FINDINGS: Brain: There is no mass, hemorrhage or extra-axial collection. There is generalized atrophy without lobar predilection. Hypodensity of the white matter is most commonly associated with chronic microvascular disease. Old infarcts in the right frontal lobe, left occipital lobe and within the basal ganglia. Vascular: No abnormal hyperdensity of the major intracranial arteries or dural venous sinuses. No intracranial atherosclerosis. Skull: The visualized skull base, calvarium and extracranial soft tissues are normal. Sinuses/Orbits: No fluid levels or advanced mucosal thickening of the visualized  paranasal sinuses. No mastoid or middle ear effusion. The orbits are normal. ASPECTS [facility] Stroke Program Early CT Score) - Ganglionic level infarction (caudate, lentiform nuclei, internal capsule, insula, M1-M3 cortex): 7 - Supraganglionic infarction (M4-M6 cortex): 3 Total score (0-10 with 10 being normal): 10 IMPRESSION: 1. No acute intracranial abnormality. 2. ASPECTS is 10. 3. Old infarcts in the right frontal lobe, left occipital lobe and within the basal ganglia. These results were communicated to Lynnae Sandhoff at 8:50 pm on 09/27/2020 by text page via the [facility] messaging system. Electronically Signed   By: Ulyses Jarred M.D.   On: 09/27/2020 20:50    Procedures Procedures   Medications Ordered in ED Medications  sodium chloride 0.9 % bolus 500 mL (500 mLs Intravenous New Bag/Given 09/27/20 2223)  aspirin chewable tablet 81 mg (81 mg Oral Given 09/27/20 2223)    ED Course  I have reviewed the triage vital signs and the nursing notes.  Pertinent labs & imaging results that were available during my care of the patient were reviewed by me and considered in my medical decision making (see chart for details).  Clinical Course as of 09/27/20 2354  Sat Sep 27, 2020  2046 87 yo female presenting with concern for right sided facial droop.  Hx of CVA within past month.  Arrives as code stroke.  Patient is not a tPA candidate.  Pending CT scan, labs. [MT]  2158 87 year old female presenting  from home with concern for right-sided weakness and unresponsiveness by her family members.  She arrives as a code stroke initially with reported right-sided facial droop.  On arrival she has a normal neurological exam.  She is mentally intact.  She has no facial droop or deficits.  She was seen by the neurology team and taken for CT scan of the head which showed no acute infarct.  My exam she has a benign neurological exam.  She has pending a metabolic and infectious work-up.  She does have a history of CKD and  electrolyte abnormalities which may be contributing to this. [MT]  2246 EKG 12-Lead No findings suggestive or consistent with hyperkelamia. Will await metabolic panel for potassium level [ZB]  2300 Had a discussion with the patient and the patient's son at bedside.  He says that she is at her baseline neurologic and mental status.  Based on what the patient has told me and what she is relating to her son, it sounds like she may have just fallen asleep and that is what concerned the family. [ZB]  2302 Comprehensive metabolic panel(!) Likely mild hypovolemia causing slightly elevated BUN and cr from previous. BP on the lower end of normal and has now normalized after IV crystalloid.  [ZB]  2322 Update from neurology: They want patient admitted to hospitalist for further workup of these episodes of decreased responsiveness which could represent seizure activity. They want to get an EEG. [ZB]  2347 Patient discussed with admitting hospitalist. [ZB]    Clinical Course User Index [MT] Langston Masker Carola Rhine, MD [ZB] Pearson Grippe, DO   MDM Rules/Calculators/A&P                          This is a 87 year old female with a complicated past medical history as above who presented to the emergency department as a code stroke after family reported development of right-sided facial droop and right-sided weakness approximately 1/5hrs PTA. No recent head injury.  Was discharged from hospital 2 days ago after having an acute CVA which presented as slurred speech. MR confirmed acute infarct within the left parietal occipital and posterior temporal lobes and possible additional punctate acute infarct in the posterior left frontal lobe.  The pattern of infarcts is suspicious for acute embolic stroke.  No recent infectious symptoms that her or family able to report. Exam not contributory.  CT code stroke study was negative for evidence of acute CVA.  Does have evidence of developing AKI.  Could be prerenal in  etiology.   See Clinical course above for further medical decision making.   Final Clinical Impression(s) / ED Diagnoses Final diagnoses:  TIA (transient ischemic attack)  AKI (acute kidney injury) [facility])    Rx / DC Orders ED Discharge Orders     None        Pearson Grippe, DO 09/27/20 2354    Wyvonnia Dusky, MD 09/28/20 1018

## 2020-09-27 NOTE — Consult Note (Addendum)
Neurology Stroke Consult H&P  DRAYA HAFT MR# [id number] 09/27/2020  CC: code stroke  History is obtained from: EMS, patient, son, chart.  HPI: [name] is a 87 y.o. female past medical history as reviewed below recently discharged after acute stroke 09/25/2020 brought in as code stroke.  Report she had acute  right-sided weakness and unresponsiveness by her family members.  She arrives as a code stroke initially with reported right-sided facial droop.  However on arrival, she had returned to baseline and her neurologic exam was normal.  She had no recollection of the event.  Later her son came and stated that his sister observed that the patient was sitting in a chair and suddenly slumped back and transiently lost consciousness.  He does not entirely sure however this may not have been the first occasion that this is happened.   LKW: 1930 tpa given: No recent stroke, too mild to treat. IR Thrombectomy No Modified Rankin Scale: 1-No significant post stroke disability and can perform usual duties with stroke symptoms NIHSS: 0  ROS: A complete ROS was performed and is negative except as noted in the HPI.   Past Medical History:  Diagnosis Date   Breast mass    Cervical spondylosis    Dilated cardiomyopathy ([place]) 05/2016   Mild LVH with EF of 20 1125%. Hypokinesis with inferior akinesis. GR 1 DD noted with elevated LVEDP. Moderate LA dilation. Moderate mitral regurgitation. Aortic valve aortic valve calcification/sclerosis without stenosis.   Hypertension      Family History  Problem Relation Age of Onset   Diabetes Mother    Hypertension Mother    Cancer Father        uknown kind of cancer   Diabetes Sister     Social History:  reports that she has quit smoking. Her smoking use included cigarettes. She has never used smokeless tobacco. She reports that she does not drink alcohol and does not use drugs.   Prior to Admission medications   Medication Sig Start Date End  Date Taking? Authorizing Provider  acetaminophen (TYLENOL) 325 MG tablet Take 650 mg by mouth every 6 (six) hours as needed for mild pain.   Yes [provider]  allopurinol (ZYLOPRIM) 100 MG tablet Take 100 mg by mouth daily.  09/26/13  Yes [provider]  aspirin EC 81 MG tablet Take 1 tablet (81 mg total) by mouth every morning. 09/25/20 12/24/20 Yes Jennye Boroughs, MD  atorvastatin (LIPITOR) 40 MG tablet Take 1 tablet (40 mg total) by mouth daily. 09/25/20  Yes Jennye Boroughs, MD  calcitRIOL (ROCALTROL) 0.5 MCG capsule Take 0.5 mcg by mouth daily.   Yes [provider]  carvedilol (COREG) 6.25 MG tablet Take 1 tablet (6.25 mg total) by mouth 2 (two) times daily with a meal. Please schedule an appointment for further refills 12/19/17  Yes Troy Sine, MD  clopidogrel (PLAVIX) 75 MG tablet Take 1 tablet (75 mg total) by mouth daily. 09/25/20 09/25/21 Yes Jennye Boroughs, MD  felodipine (PLENDIL) 10 MG 24 hr tablet Take 10 mg by mouth daily.   Yes [provider]  furosemide (LASIX) 40 MG tablet Take 0.5-1 tablets (20-40 mg total) by mouth 2 (two) times daily. Take 40 mg in the morning and 20 mg in afternoon, may take extra 20 mg dose in the afternoon as needed Patient taking differently: Take 20-40 mg by mouth See admin instructions. Take 40 mg in the morning and 20 mg in afternoon, may take  extra 20 mg dose in the afternoon as needed for fluid 09/25/20  Yes Jennye Boroughs, MD  gabapentin (NEURONTIN) 300 MG capsule Take 300 mg by mouth at bedtime.   Yes [provider]  hydrALAZINE (APRESOLINE) 25 MG tablet Take 1 tablet (25 mg total) by mouth 2 (two) times daily. NEED OV. 09/25/20  Yes Jennye Boroughs, MD  isosorbide mononitrate (IMDUR) 30 MG 24 hr tablet TAKE 1 TABLET BY MOUTH EVERY DAY Patient taking differently: Take 30 mg by mouth daily. 01/04/18  Yes Leonie Man, MD  Multiple Vitamins-Minerals (PRESERVISION AREDS PO) Take 1 capsule by mouth daily.     Yes [provider]  potassium chloride 20 MEQ TBCR Take 20 mEq by mouth daily. Patient not taking: Reported on 09/27/2020 08/09/16   Thurnell Lose, MD    Exam: Current vital signs: BP 129/66   Pulse 72   Temp 98.6 F (37 C) (Oral)   Resp (!) 26   Wt 70.1 kg   SpO2 100%   BMI 28.26 kg/m   Physical Exam  Constitutional: Appears well-developed and well-nourished.  Psych: Affect appropriate to situation Eyes: No scleral injection HENT: No OP obstruction. Head: Normocephalic.  Cardiovascular: Normal rate and regular rhythm.  Respiratory: Effort normal, symmetric excursions bilaterally, no audible wheezing. GI: Soft.  No distension. There is no tenderness.  Skin: WDI  Neuro: Mental Status: Patient is awake, alert, oriented to person, place, month, year, and situation. Patient is able to give a clear and coherent history. Speech fluent, intact comprehension and repetition. No signs of aphasia or neglect. Visual Fields are full. Pupils are equal, round, and reactive to light. EOMI without ptosis or diploplia.  Facial sensation is symmetric to temperature Facial movement is symmetric.  Hearing is intact to voice. Uvula midline and palate elevates symmetrically. Shoulder shrug is symmetric. Tongue is midline without atrophy or fasciculations.  Tone is normal. Bulk is normal. 5/5 strength was present in all four extremities. Sensation is symmetric to light touch and temperature in the arms and legs. Deep Tendon Reflexes: 2+ and symmetric in the biceps and patellae. Toes are downgoing bilaterally. FNF and HKS are intact bilaterally. Gait - Deferred  I have reviewed labs in epic and the pertinent results are: No labs at the time of evaluation.  I have reviewed the images obtained: NCT head showed no acute intracranial abnormality ASPECTS is 10. Chronic infarcts in the right frontal lobe, left occipital lobe and within the basal ganglia.  MRI brain 09/24/2020  showed multiple, small cortical/subcortical acute infarcts within the left parietal, occipital and posterior temporal lobes (PCA vascular territory). Possible additional punctate acute infarct within the posterior left frontal lobe (motor strip). . Chronic cortically based infarcts within the right frontal and left occipital lobes. Chronic hemosiderin deposition at site of these infarcts and within the right occipital lobe. Moderate chronic small vessel ischemic changes within the cerebral white matter and pons. Chronic left thalamic lacunar infarct. Small chronic infarcts within the bilateral cerebellar hemispheres.  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  Assessment: CARESA KAMPMAN is a 87 y.o. female PMHx as noted above with transient right-sided weakness and unresponsiveness now back to baseline with normal neurologic exam.  EMS did report that  she had hypotension with blood pressures measuring 80/40 she received 400 cc crystalloid bolus.  She does have significant vascular risk factors including hypertension and previous strokes and differential diagnosis includes TIA, seizure, hypotension.  She does have vascular risk factors and recent MRA showed moderate to severe left P2 stenotic disease moderate stenosis of the left ICA and she may benefit from continued DAPT ASA 325 mg and clopidogrel 75 mg daily for 90 days however, will decide after MRI brain.  Due to her recent stroke and returned to her baseline she was not a candidate for tPA.  Plan: - MRI brain without contrast. - Aspirin '81mg'$  daily. - Clopidogrel '75mg'$  daily for 3 weeks. - Blood pressure goal <140/90.  - Telemetry monitoring for arrhythmia. - Recommend bedside Swallow screen. -  Recommend Stroke education. - Recommend PT/OT/SLP consult. - Routine EEG - Ordered.  This patient is critically ill and at significant risk of neurological worsening, death and care requires constant monitoring of vital signs, hemodynamics,respiratory and cardiac monitoring, neurological assessment, discussion with family, other specialists and medical decision making of high complexity. I spent 75 minutes of neurocritical care time  in the care of  this patient. This was time spent independent of any time provided by nurse practitioner or PA.  Electronically signed by:  Lynnae Sandhoff, MD Page: [id number] 09/27/2020, 10:21 PM

## 2020-09-27 NOTE — ED Triage Notes (Deleted)
Pt BIB [facility] after family calling reporting stroke like symptoms. EMS reports weakness and right sided droop. Initial BP 80/40 with improvement after 430m bolus en route.Pt A&O x4 at this time.

## 2020-09-27 NOTE — H&P (Addendum)
History and Physical  [name] [id number] DOB: [birth year] DOA: 09/27/2020  Referring physician: Dr. Jenean Lindau, [place]. PCP: Troy Sine, MD  Outpatient Specialists: Cardiology, vascular surgery, neurology. Patient coming from: Home.  Chief Complaint: Right-sided weakness.  HPI: [name] is a 87 y.o. female with medical history significant for HFrEF 30 to 35%, dilated cardiomyopathy, essential hypertension, CKD stage IV, history of breast mass, chronic back pain, ventral hernia with previous repair who was recently discharged from the hospital 2 days prior to this presentation after diagnosis of stroke, suspected embolic in nature.  She presents this time due to right-sided weakness after eating her dinner and shortly falling asleep.on the day of presentation.  Per her son at bedside, her daughter had concern for right sided weakness.  Unknown when last known well.  EMS was activated.  She presented to the ED as a code stroke.  She was seen by neurology who recommended EEG and repeat MRI brain.  TR[facility], hospitalist team, was asked to admit.  ED Course: Temperature 98.6, BP 143/74, pulse 68, respiration rate 25, O2 saturation 100% on room air.  Lab studies remarkable for BUN 92, creatinine 4.02, AST 54, ALT 29, GFR 10, BNP 111, troponin 88.  UA positive for pyuria.  Review of Systems: Review of systems as noted in the HPI. All other systems reviewed and are negative.   Past Medical History:  Diagnosis Date   Breast mass    Cervical spondylosis    Dilated cardiomyopathy ([place]) 05/2016   Mild LVH with EF of 20 1125%. Hypokinesis with inferior akinesis. GR 1 DD noted with elevated LVEDP. Moderate LA dilation. Moderate mitral regurgitation. Aortic valve aortic valve calcification/sclerosis without stenosis.   Hypertension    Past Surgical History:  Procedure Laterality Date   APPENDECTOMY     BACK SURGERY     x 4   CATARACT EXTRACTION     KNEE SURGERY     x 2   TRANSTHORACIC  ECHOCARDIOGRAM  05/2016   Mild LVH with EF of 20 1125%. Hypokinesis with inferior akinesis. GR 1 DD noted with elevated LVEDP. Moderate LA dilation. Moderate mitral regurgitation. Aortic valve aortic valve calcification/sclerosis without stenosis.   VENTRAL HERNIA REPAIR N/A 09/14/2013   Procedure: LAPAROSCOPIC ventral HERNIA REPAIR with mesh ;  Surgeon: Shann Medal, MD;  Location: [facility];  Service: General;  Laterality: N/A;    Social History:  reports that she has quit smoking. Her smoking use included cigarettes. She has never used smokeless tobacco. She reports that she does not drink alcohol and does not use drugs.   No Known Allergies  Family History  Problem Relation Age of Onset   Diabetes Mother    Hypertension Mother    Cancer Father        uknown kind of cancer   Diabetes Sister       Prior to Admission medications   Medication Sig Start Date End Date Taking? Authorizing Provider  acetaminophen (TYLENOL) 325 MG tablet Take 650 mg by mouth every 6 (six) hours as needed for mild pain.   Yes [provider]  allopurinol (ZYLOPRIM) 100 MG tablet Take 100 mg by mouth daily.  09/26/13  Yes [provider]  aspirin EC 81 MG tablet Take 1 tablet (81 mg total) by mouth every morning. 09/25/20 12/24/20 Yes Jennye Boroughs, MD  atorvastatin (LIPITOR) 40 MG tablet Take 1 tablet (40 mg total) by mouth daily. 09/25/20  Yes Jennye Boroughs, MD  calcitRIOL (ROCALTROL)  0.5 MCG capsule Take 0.5 mcg by mouth daily.   Yes [provider]  carvedilol (COREG) 6.25 MG tablet Take 1 tablet (6.25 mg total) by mouth 2 (two) times daily with a meal. Please schedule an appointment for further refills 12/19/17  Yes Troy Sine, MD  clopidogrel (PLAVIX) 75 MG tablet Take 1 tablet (75 mg total) by mouth daily. 09/25/20 09/25/21 Yes Jennye Boroughs, MD  felodipine (PLENDIL) 10 MG 24 hr tablet Take 10 mg by mouth daily.   Yes [provider]  furosemide (LASIX) 40 MG tablet  Take 0.5-1 tablets (20-40 mg total) by mouth 2 (two) times daily. Take 40 mg in the morning and 20 mg in afternoon, may take extra 20 mg dose in the afternoon as needed Patient taking differently: Take 20-40 mg by mouth See admin instructions. Take 40 mg in the morning and 20 mg in afternoon, may take extra 20 mg dose in the afternoon as needed for fluid 09/25/20  Yes Jennye Boroughs, MD  gabapentin (NEURONTIN) 300 MG capsule Take 300 mg by mouth at bedtime.   Yes [provider]  hydrALAZINE (APRESOLINE) 25 MG tablet Take 1 tablet (25 mg total) by mouth 2 (two) times daily. NEED OV. 09/25/20  Yes Jennye Boroughs, MD  isosorbide mononitrate (IMDUR) 30 MG 24 hr tablet TAKE 1 TABLET BY MOUTH EVERY DAY Patient taking differently: Take 30 mg by mouth daily. 01/04/18  Yes Leonie Man, MD  Multiple Vitamins-Minerals (PRESERVISION AREDS PO) Take 1 capsule by mouth daily.    Yes [provider]  potassium chloride 20 MEQ TBCR Take 20 mEq by mouth daily. Patient not taking: Reported on 09/27/2020 08/09/16   Thurnell Lose, MD    Physical Exam: BP 116/61   Pulse 72   Temp 98.6 F (37 C) (Oral)   Resp (!) 24   Wt 70.1 kg   SpO2 100%   BMI 28.26 kg/m   General: 87 y.o. year-old female well developed well nourished in no acute distress.  Alert and interactive. Cardiovascular: Regular rate and rhythm with no rubs or gallops.  No thyromegaly or JVD noted.  No lower extremity edema. 2/4 pulses in all 4 extremities. Respiratory: Clear to auscultation with no wheezes or rales. Good inspiratory effort. Abdomen: Soft nontender nondistended with normal bowel sounds x4 quadrants. Muskuloskeletal: No cyanosis, clubbing or edema noted bilaterally Neuro: CN II-XII intact, strength, sensation, reflexes Skin: No ulcerative lesions noted or rashes Psychiatry: Judgement and insight appear normal. Mood is appropriate for condition and setting          Labs on Admission:  Basic Metabolic  Panel: Recent Labs  Lab 09/23/20 1540 09/23/20 1546 09/24/20 0459 09/27/20 2042 09/27/20 2131  NA 136 136 138 136 138  K 5.9* 5.7* 4.8 7.8* 4.6  CL 100 102 101 105 102  CO2 27  --  26  --  27  GLUCOSE 163* 159* 88 181* 177*  BUN 78* 72* 73* 129* 92*  CREATININE 3.38* 3.40* 3.07* 4.10* 4.02*  CALCIUM 8.3*  --  8.3*  --  8.1*   Liver Function Tests: Recent Labs  Lab 09/23/20 1540 09/24/20 0459 09/27/20 2131  AST 18 17 54*  ALT '10 11 29  '$ ALKPHOS 82 74 80  BILITOT 0.3 0.6 0.7  PROT 7.0 6.4* 6.2*  ALBUMIN 3.4* 3.2* 3.1*   No results for input(s): LIPASE, AMYLASE in the last 168 hours. No results for input(s): AMMONIA in the last 168 hours. CBC: Recent  Labs  Lab 09/23/20 1540 09/23/20 1546 09/24/20 0459 09/27/20 2036 09/27/20 2042  WBC 7.3  --  10.1 7.9  --   NEUTROABS 5.0  --   --  5.3  --   HGB 9.9* 11.2* 10.1* 10.0* 10.9*  HCT 31.0* 33.0* 30.0* 31.3* 32.0*  MCV 95.7  --  92.0 95.7  --   PLT 155  --  148* 167  --    Cardiac Enzymes: No results for input(s): CKTOTAL, CKMB, CKMBINDEX, TROPONINI in the last 168 hours.  BNP (last 3 results) Recent Labs    09/23/20 2229 09/27/20 2128  BNP 195.9* 111.2*    ProBNP (last 3 results) No results for input(s): PROBNP in the last 8760 hours.  CBG: Recent Labs  Lab 09/23/20 1539 09/27/20 2033  GLUCAP 139* 190*    Radiological Exams on Admission: DG Chest Portable 1 View  Result Date: 09/27/2020 CLINICAL DATA:  87 year old with confusion. EXAM: PORTABLE CHEST 1 VIEW COMPARISON:  09/23/2020 FINDINGS: Stable upper normal heart size. Unchanged mediastinal contours with aortic atherosclerosis. Smaller patchy opacity at the left lung base with possible small left pleural effusion. No new or acute airspace disease. No pulmonary edema or pneumothorax. Bones under mineralized without acute osseous abnormality. IMPRESSION: Mild patchy opacity at the left lung base, unchanged from prior exam, atelectasis versus pneumonia.  Possible small left pleural effusion. Electronically Signed   By: Keith Rake M.D.   On: 09/27/2020 22:08   CT HEAD CODE STROKE WO CONTRAST  Result Date: 09/27/2020 CLINICAL DATA:  Code stroke.  Right facial droop and weakness EXAM: CT HEAD WITHOUT CONTRAST TECHNIQUE: Contiguous axial images were obtained from the base of the skull through the vertex without intravenous contrast. COMPARISON:  None. FINDINGS: Brain: There is no mass, hemorrhage or extra-axial collection. There is generalized atrophy without lobar predilection. Hypodensity of the white matter is most commonly associated with chronic microvascular disease. Old infarcts in the right frontal lobe, left occipital lobe and within the basal ganglia. Vascular: No abnormal hyperdensity of the major intracranial arteries or dural venous sinuses. No intracranial atherosclerosis. Skull: The visualized skull base, calvarium and extracranial soft tissues are normal. Sinuses/Orbits: No fluid levels or advanced mucosal thickening of the visualized paranasal sinuses. No mastoid or middle ear effusion. The orbits are normal. ASPECTS [facility] Stroke Program Early CT Score) - Ganglionic level infarction (caudate, lentiform nuclei, internal capsule, insula, M1-M3 cortex): 7 - Supraganglionic infarction (M4-M6 cortex): 3 Total score (0-10 with 10 being normal): 10 IMPRESSION: 1. No acute intracranial abnormality. 2. ASPECTS is 10. 3. Old infarcts in the right frontal lobe, left occipital lobe and within the basal ganglia. These results were communicated to Lynnae Sandhoff at 8:50 pm on 09/27/2020 by text page via the [facility] messaging system. Electronically Signed   By: Ulyses Jarred M.D.   On: 09/27/2020 20:50    EKG: I independently viewed the EKG done and my findings are as followed: Sinus rhythm rate of 66.  Nonspecific ST-T changes.  QTc 466.   Assessment/Plan Present on Admission: **None**  Active Problems:   Acute right-sided weakness  Acute  right-sided weakness, improved. Presented as a code stroke CT head nonacute Seen by neurology recommendation for EEG and MRI brain. Resume PT OT evaluation Fall precautions.  Elevated troponin, suspect demand ischemia Presenting troponin S 88 No reported chest pain 12 EKG with no evidence of acute ischemia Repeat troponin  Presumptive UTI UA positive for blood pyuria Start Rocephin empirically Follow urine culture, if negative  DC antibiotic  Isolated elevated AST Presented with AST of 54 Monitor for now  CKD stage IV Presented with creatinine of 4.02 Continue to monitor for toxic agents, dehydration, hypotension. Monitor urine output Repeat renal panel in the morning  Anemia of chronic disease in the setting of advanced CKD. Hemoglobin stable at 10.9 No overt bleeding.  Chronic combined diastolic and systolic CHF Euvolemic on exam Resume home regimen Strict I's and O's and daily weight.   DVT prophylaxis: Subcu heparin 3 times daily  Code Status: Full code as stated by the patient herself and her son at bedside.  Family Communication: Updated her son in person at bedside.     Disposition Plan: Admit to telemetry medical bed.  Consults called: Neurology consulted by EDP.  Admission status: Observation status.   Status is: Observation    Dispo:  Patient From: Home  Planned Disposition: Home, possibly on 09/28/2020.  Medically stable for discharge: No      Kayleen Memos MD [facility] Pager [phone]  If 7PM-7AM, please contact night-coverage [email or web address] Password [facility]  09/27/2020, 11:38 PM

## 2020-09-27 NOTE — ED Triage Notes (Signed)
Pt BIB [facility] after family called reporting stroke like symptoms. EMS reports weakness and right sided facial droop. Initial BP 80/40 with improvement after 449m bolus en route. Pt A&Ox4 at this time.

## 2020-09-28 ENCOUNTER — Observation Stay (HOSPITAL_COMMUNITY): Payer: Medicare PPO

## 2020-09-28 ENCOUNTER — Encounter (HOSPITAL_COMMUNITY): Payer: Self-pay | Admitting: Internal Medicine

## 2020-09-28 DIAGNOSIS — I6930 Unspecified sequelae of cerebral infarction: Secondary | ICD-10-CM | POA: Diagnosis not present

## 2020-09-28 DIAGNOSIS — N179 Acute kidney failure, unspecified: Secondary | ICD-10-CM

## 2020-09-28 DIAGNOSIS — I6330 Cerebral infarction due to thrombosis of unspecified cerebral artery: Secondary | ICD-10-CM | POA: Insufficient documentation

## 2020-09-28 DIAGNOSIS — G459 Transient cerebral ischemic attack, unspecified: Secondary | ICD-10-CM | POA: Diagnosis not present

## 2020-09-28 DIAGNOSIS — I10 Essential (primary) hypertension: Secondary | ICD-10-CM

## 2020-09-28 DIAGNOSIS — R531 Weakness: Secondary | ICD-10-CM | POA: Diagnosis not present

## 2020-09-28 LAB — COMPREHENSIVE METABOLIC PANEL
ALT: 32 U/L (ref 0–44)
ALT: 32 U/L (ref 0–44)
AST: 52 U/L — ABNORMAL HIGH (ref 15–41)
AST: 47 U/L — ABNORMAL HIGH (ref 15–41)
Albumin: 3 g/dL — ABNORMAL LOW (ref 3.5–5.0)
Albumin: 3.1 g/dL — ABNORMAL LOW (ref 3.5–5.0)
Alkaline Phosphatase: 77 U/L (ref 38–126)
Alkaline Phosphatase: 79 U/L (ref 38–126)
Anion gap: 9 (ref 5–15)
Anion gap: 9 (ref 5–15)
BUN: 76 mg/dL — ABNORMAL HIGH (ref 8–23)
BUN: 94 mg/dL — ABNORMAL HIGH (ref 8–23)
CO2: 21 mmol/L — ABNORMAL LOW (ref 22–32)
CO2: 27 mmol/L (ref 22–32)
Calcium: 7.2 mg/dL — ABNORMAL LOW (ref 8.9–10.3)
Calcium: 8.4 mg/dL — ABNORMAL LOW (ref 8.9–10.3)
Chloride: 109 mmol/L (ref 98–111)
Chloride: 104 mmol/L (ref 98–111)
Creatinine, Ser: 3.13 mg/dL — ABNORMAL HIGH (ref 0.44–1.00)
Creatinine, Ser: 3.77 mg/dL — ABNORMAL HIGH (ref 0.44–1.00)
GFR, Estimated: 13 mL/min — ABNORMAL LOW (ref >60)
GFR, Estimated: 11 mL/min — ABNORMAL LOW (ref >60)
Glucose, Bld: 104 mg/dL — ABNORMAL HIGH (ref 70–99)
Glucose, Bld: 104 mg/dL — ABNORMAL HIGH (ref 70–99)
Potassium: 3.9 mmol/L (ref 3.5–5.1)
Potassium: 4.2 mmol/L (ref 3.5–5.1)
Sodium: 139 mmol/L (ref 135–145)
Sodium: 140 mmol/L (ref 135–145)
Total Bilirubin: 0.6 mg/dL (ref 0.3–1.2)
Total Bilirubin: 0.9 mg/dL (ref 0.3–1.2)
Total Protein: 6.2 g/dL — ABNORMAL LOW (ref 6.5–8.1)
Total Protein: 6.4 g/dL — ABNORMAL LOW (ref 6.5–8.1)

## 2020-09-28 LAB — CBC
HCT: 30 % — ABNORMAL LOW (ref 36.0–46.0)
Hemoglobin: 9.9 g/dL — ABNORMAL LOW (ref 12.0–15.0)
MCH: 30.8 pg (ref 26.0–34.0)
MCHC: 33 g/dL (ref 30.0–36.0)
MCV: 93.5 fL (ref 80.0–100.0)
Platelets: 166 10*3/uL (ref 150–400)
RBC: 3.21 MIL/uL — ABNORMAL LOW (ref 3.87–5.11)
RDW: 15.4 % (ref 11.5–15.5)
WBC: 8.7 10*3/uL (ref 4.0–10.5)
nRBC: 0.2 % (ref 0.0–0.2)

## 2020-09-28 LAB — TROPONIN I (HIGH SENSITIVITY)
Troponin I (High Sensitivity): 87 ng/L — ABNORMAL HIGH (ref <18)
Troponin I (High Sensitivity): 93 ng/L — ABNORMAL HIGH (ref <18)
Troponin I (High Sensitivity): 81 ng/L — ABNORMAL HIGH (ref <18)

## 2020-09-28 LAB — URINALYSIS, ROUTINE W REFLEX MICROSCOPIC
Bilirubin Urine: NEGATIVE
Glucose, UA: NEGATIVE mg/dL
Ketones, ur: NEGATIVE mg/dL
Nitrite: NEGATIVE
Protein, ur: NEGATIVE mg/dL
Specific Gravity, Urine: 1.011 (ref 1.005–1.030)
WBC, UA: >50 WBC/hpf — ABNORMAL HIGH (ref 0–5)
pH: 6 (ref 5.0–8.0)

## 2020-09-28 LAB — RESP PANEL BY RT-PCR (FLU A&B, COVID) ARPGX2
Influenza A by PCR: NEGATIVE
Influenza B by PCR: NEGATIVE
SARS Coronavirus 2 by RT PCR: NEGATIVE

## 2020-09-28 LAB — RAPID URINE DRUG SCREEN, HOSP PERFORMED
Amphetamines: NOT DETECTED
Barbiturates: NOT DETECTED
Benzodiazepines: NOT DETECTED
Cocaine: NOT DETECTED
Opiates: NOT DETECTED
Tetrahydrocannabinol: NOT DETECTED

## 2020-09-28 LAB — MAGNESIUM: Magnesium: 4 mg/dL — ABNORMAL HIGH (ref 1.7–2.4)

## 2020-09-28 LAB — PHOSPHORUS: Phosphorus: 4.6 mg/dL (ref 2.5–4.6)

## 2020-09-28 MED ORDER — ISOSORBIDE MONONITRATE ER 30 MG PO TB24
30.0000 mg | ORAL_TABLET | Freq: Every day | ORAL | Status: DC
  Administered 2020-09-28 – 2020-09-29 (×2): 30 mg via ORAL
  Filled 2020-09-28 (×2): qty 1

## 2020-09-28 MED ORDER — FELODIPINE ER 5 MG PO TB24
10.0000 mg | ORAL_TABLET | Freq: Every day | ORAL | Status: DC
  Administered 2020-09-28 – 2020-09-29 (×2): 10 mg via ORAL
  Filled 2020-09-28 (×2): qty 2
  Filled 2020-09-28: qty 1

## 2020-09-28 MED ORDER — FUROSEMIDE 20 MG PO TABS
20.0000 mg | ORAL_TABLET | Freq: Every day | ORAL | Status: DC
  Administered 2020-09-28: 20 mg via ORAL
  Filled 2020-09-28: qty 1

## 2020-09-28 MED ORDER — HYDRALAZINE HCL 25 MG PO TABS
25.0000 mg | ORAL_TABLET | Freq: Two times a day (BID) | ORAL | Status: DC
  Administered 2020-09-28 (×3): 25 mg via ORAL
  Filled 2020-09-28 (×4): qty 1

## 2020-09-28 MED ORDER — ACETAMINOPHEN 325 MG PO TABS
650.0000 mg | ORAL_TABLET | Freq: Four times a day (QID) | ORAL | Status: DC | PRN
  Administered 2020-09-29: 650 mg via ORAL
  Filled 2020-09-28: qty 2

## 2020-09-28 MED ORDER — ASPIRIN EC 81 MG PO TBEC
81.0000 mg | DELAYED_RELEASE_TABLET | Freq: Every morning | ORAL | Status: DC
  Administered 2020-09-28 – 2020-09-29 (×2): 81 mg via ORAL
  Filled 2020-09-28 (×2): qty 1

## 2020-09-28 MED ORDER — GABAPENTIN 300 MG PO CAPS
300.0000 mg | ORAL_CAPSULE | Freq: Every day | ORAL | Status: DC
  Administered 2020-09-28 (×2): 300 mg via ORAL
  Filled 2020-09-28 (×2): qty 1

## 2020-09-28 MED ORDER — SODIUM CHLORIDE 0.9 % IV SOLN
1.0000 g | Freq: Every day | INTRAVENOUS | Status: DC
  Administered 2020-09-28 (×2): 1 g via INTRAVENOUS
  Filled 2020-09-28: qty 1
  Filled 2020-09-28 (×2): qty 10

## 2020-09-28 MED ORDER — MELATONIN 3 MG PO TABS: 3.0000 mg | ORAL_TABLET | Freq: Every evening | ORAL | Status: DC | PRN

## 2020-09-28 MED ORDER — CLOPIDOGREL BISULFATE 75 MG PO TABS
75.0000 mg | ORAL_TABLET | Freq: Every day | ORAL | Status: DC
  Administered 2020-09-28 – 2020-09-29 (×2): 75 mg via ORAL
  Filled 2020-09-28 (×2): qty 1

## 2020-09-28 MED ORDER — POLYETHYLENE GLYCOL 3350 17 G PO PACK: 17.0000 g | PACK | Freq: Every day | ORAL | Status: DC | PRN

## 2020-09-28 MED ORDER — PROSIGHT PO TABS
1.0000 | ORAL_TABLET | Freq: Every day | ORAL | Status: DC
  Administered 2020-09-28 – 2020-09-29 (×2): 1 via ORAL
  Filled 2020-09-28 (×2): qty 1

## 2020-09-28 MED ORDER — FUROSEMIDE 20 MG PO TABS: 20.0000 mg | ORAL_TABLET | Freq: Two times a day (BID) | ORAL | Status: DC

## 2020-09-28 MED ORDER — FUROSEMIDE 40 MG PO TABS
40.0000 mg | ORAL_TABLET | Freq: Every day | ORAL | Status: DC
  Administered 2020-09-28: 40 mg via ORAL
  Filled 2020-09-28 (×2): qty 1

## 2020-09-28 MED ORDER — ALLOPURINOL 100 MG PO TABS
100.0000 mg | ORAL_TABLET | Freq: Every day | ORAL | Status: DC
  Administered 2020-09-28 – 2020-09-29 (×2): 100 mg via ORAL
  Filled 2020-09-28 (×2): qty 1

## 2020-09-28 MED ORDER — HEPARIN SODIUM (PORCINE) 5000 UNIT/ML IJ SOLN
5000.0000 [IU] | Freq: Three times a day (TID) | INTRAMUSCULAR | Status: DC
  Administered 2020-09-28 – 2020-09-29 (×4): 5000 [IU] via SUBCUTANEOUS
  Filled 2020-09-28 (×4): qty 1

## 2020-09-28 MED ORDER — CARVEDILOL 6.25 MG PO TABS
6.2500 mg | ORAL_TABLET | Freq: Two times a day (BID) | ORAL | Status: DC
  Administered 2020-09-28 (×2): 6.25 mg via ORAL
  Filled 2020-09-28 (×3): qty 1

## 2020-09-28 MED ORDER — ATORVASTATIN CALCIUM 40 MG PO TABS
40.0000 mg | ORAL_TABLET | Freq: Every day | ORAL | Status: DC
  Administered 2020-09-28 – 2020-09-29 (×2): 40 mg via ORAL
  Filled 2020-09-28 (×2): qty 1

## 2020-09-28 MED ORDER — ONDANSETRON HCL 4 MG/2ML IJ SOLN: 4.0000 mg | Freq: Four times a day (QID) | INTRAMUSCULAR | Status: DC | PRN

## 2020-09-28 NOTE — Progress Notes (Addendum)
PROGRESS NOTE    [name]  [id number] DOB: [birth year] DOA: 09/27/2020 PCP: Troy Sine, MD    Brief Narrative:  87 y.o. female with medical history significant for HFrEF 30 to 35%, dilated cardiomyopathy, essential hypertension, CKD stage IV, history of breast mass, chronic back pain, ventral hernia with previous repair who was recently discharged from the hospital 2 days prior to this presentation after diagnosis of stroke, suspected embolic in nature.  She presents this time due to right-sided weakness after eating her dinner and shortly falling asleep.on the day of presentation.  Per her son at bedside, her daughter had concern for right sided weakness.  Unknown when last known well.  EMS was activated.  She presented to the ED as a code stroke  Assessment & Plan:   Active Problems:   Acute right-sided weakness   Cerebral thrombosis with cerebral infarction  Acute right-sided weakness, improved. Presented as a code stroke CT head nonacute Seen by Neurology, recommendation for EEG EEG reviewed with Neurology, found to be neg for seizure Cont with PT OT evaluation Suspect secondary to UTI given below   Elevated troponin, suspect demand ischemia Presenting troponin S 88 No reported chest pain 12 EKG with no evidence of acute ischemia Repeat troponin trending down   UTI ruled in UA positive for blood pyuria Now on Rocephin empirically Urine culture is pending   Isolated elevated AST Presented with AST of 54 Monitor for now   CKD stage IV Presented with creatinine of 4.02 Continue to monitor for toxic agents, dehydration, hypotension. Monitor urine output Recheck bmet in AM   Anemia of chronic disease in the setting of advanced CKD. No overt bleeding. Hgb remain stable at this time, labs reviewed   Chronic combined diastolic and systolic CHF Noted to be euvolemic on exam Continue home regimen Strict I's and O's and daily weight.    DVT prophylaxis:  Heparin subq Code Status: DNR - confirmed with pt and family at bedside Family Communication: Pt in room, family is at bedside  Status is: Observation  The patient remains OBS appropriate and will d/c before 2 midnights.  Dispo:  Patient From: Home  Planned Disposition: Home  Medically stable for discharge: No      Consultants:  Neurology  Procedures:    Antimicrobials: Anti-infectives (From admission, onward)    Start     Dose/Rate Route Frequency Ordered Stop   09/28/20 0130  cefTRIAXone (ROCEPHIN) 1 g in sodium chloride 0.9 % 100 mL IVPB        1 g 200 mL/hr over 30 Minutes Intravenous Daily at bedtime 09/28/20 0116         Subjective: Without complaints  Objective: Vitals:   09/28/20 0220 09/28/20 0441 09/28/20 0601 09/28/20 1734  BP: (!) 174/75 137/64 124/63 115/65  Pulse: 78 70  60  Resp: '20 15  12  '$ Temp: 97.7 F (36.5 C)  97.7 F (36.5 C) 98.5 F (36.9 C)  TempSrc: Oral  Oral Oral  SpO2: 99% 97%  100%  Weight: 70.9 kg     Height: '5\' 2"'$  (1.575 m)       Intake/Output Summary (Last 24 hours) at 09/28/2020 1752 Last data filed at 09/28/2020 0220 Gross per 24 hour  Intake 600 ml  Output --  Net 600 ml   Filed Weights   09/27/20 2000 09/28/20 0220  Weight: 70.1 kg 70.9 kg    Examination: General exam: Awake, laying in bed, in nad Respiratory system: Normal respiratory  effort, no wheezing Cardiovascular system: regular rate, s1, s2 Gastrointestinal system: Soft, nondistended, positive BS Central nervous system: CN2-12 grossly intact, strength intact Extremities: Perfused, no clubbing Skin: Normal skin turgor, no notable skin lesions seen Psychiatry: Mood normal // no visual hallucinations   Data Reviewed: I have personally reviewed following labs and imaging studies  CBC: Recent Labs  Lab 09/23/20 1540 09/23/20 1546 09/24/20 0459 09/27/20 2036 09/27/20 2042 09/28/20 0355  WBC 7.3  --  10.1 7.9  --  8.7  NEUTROABS 5.0  --   --  5.3  --    --   HGB 9.9* 11.2* 10.1* 10.0* 10.9* 9.9*  HCT 31.0* 33.0* 30.0* 31.3* 32.0* 30.0*  MCV 95.7  --  92.0 95.7  --  93.5  PLT 155  --  148* 167  --  [id number]   Basic Metabolic Panel: Recent Labs  Lab 09/23/20 1540 09/23/20 1546 09/24/20 0459 09/27/20 2042 09/27/20 2131 09/28/20 0030 09/28/20 0355  NA 136   < > 138 136 138 139 140  K 5.9*   < > 4.8 7.8* 4.6 3.9 4.2  CL 100   < > 101 105 102 109 104  CO2 27  --  26  --  27 21* 27  GLUCOSE 163*   < > 88 181* 177* 104* 104*  BUN 78*   < > 73* 129* 92* 76* 94*  CREATININE 3.38*   < > 3.07* 4.10* 4.02* 3.13* 3.77*  CALCIUM 8.3*  --  8.3*  --  8.1* 7.2* 8.4*  MG  --   --   --   --   --   --  4.0*  PHOS  --   --   --   --   --   --  4.6   < > = values in this interval not displayed.   GFR: Estimated Creatinine Clearance: 8.2 mL/min (A) (by C-G formula based on SCr of 3.77 mg/dL (H)). Liver Function Tests: Recent Labs  Lab 09/23/20 1540 09/24/20 0459 09/27/20 2131 09/28/20 0030 09/28/20 0355  AST 18 17 54* 52* 47*  ALT '10 11 29 '$ 32 32  ALKPHOS 82 74 80 77 79  BILITOT 0.3 0.6 0.7 0.6 0.9  PROT 7.0 6.4* 6.2* 6.2* 6.4*  ALBUMIN 3.4* 3.2* 3.1* 3.0* 3.1*   No results for input(s): LIPASE, AMYLASE in the last 168 hours. No results for input(s): AMMONIA in the last 168 hours. Coagulation Profile: Recent Labs  Lab 09/23/20 1540 09/27/20 2036  INR 1.1 1.1   Cardiac Enzymes: No results for input(s): CKTOTAL, CKMB, CKMBINDEX, TROPONINI in the last 168 hours. BNP (last 3 results) No results for input(s): PROBNP in the last 8760 hours. HbA1C: No results for input(s): HGBA1C in the last 72 hours. CBG: Recent Labs  Lab 09/23/20 1539 09/27/20 2033  GLUCAP 139* 190*   Lipid Profile: No results for input(s): CHOL, HDL, LDLCALC, TRIG, CHOLHDL, LDLDIRECT in the last 72 hours. Thyroid Function Tests: No results for input(s): TSH, T4TOTAL, FREET4, T3FREE, THYROIDAB in the last 72 hours. Anemia Panel: No results for input(s):  VITAMINB12, FOLATE, FERRITIN, TIBC, IRON, RETICCTPCT in the last 72 hours. Sepsis Labs: No results for input(s): PROCALCITON, LATICACIDVEN in the last 168 hours.  Recent Results (from the past 240 hour(s))  Resp Panel by RT-PCR (Flu A&B, Covid) Nasopharyngeal Swab     Status: None   Collection Time: 09/23/20  8:12 PM   Specimen: Nasopharyngeal Swab; Nasopharyngeal(NP) swabs in vial transport medium  Result Value  Ref Range Status   SARS Coronavirus 2 by RT PCR NEGATIVE NEGATIVE Final    Comment: (NOTE) SARS-CoV-2 target nucleic acids are NOT DETECTED.  The SARS-CoV-2 RNA is generally detectable in upper respiratory specimens during the acute phase of infection. The lowest concentration of SARS-CoV-2 viral copies this assay can detect is 138 copies/mL. A negative result does not preclude SARS-Cov-2 infection and should not be used as the sole basis for treatment or other patient management decisions. A negative result may occur with  improper specimen collection/handling, submission of specimen other than nasopharyngeal swab, presence of viral mutation(s) within the areas targeted by this assay, and inadequate number of viral copies(<138 copies/mL). A negative result must be combined with clinical observations, patient history, and epidemiological information. The expected result is Negative.  Fact Sheet for Patients:  EntrepreneurPulse.com.au  Fact Sheet for Healthcare Providers:  IncredibleEmployment.be  This test is no t yet approved or cleared by the [place] FDA and  has been authorized for detection and/or diagnosis of SARS-CoV-2 by FDA under an Emergency Use Authorization (EUA). This EUA will remain  in effect (meaning this test can be used) for the duration of the COVID-19 declaration under Section 564(b)(1) of the Act, 21 U.S.C.section 360bbb-3(b)(1), unless the authorization is terminated  or revoked sooner.       Influenza A  by PCR NEGATIVE NEGATIVE Final   Influenza B by PCR NEGATIVE NEGATIVE Final    Comment: (NOTE) The Xpert Xpress SARS-CoV-2/FLU/RSV plus assay is intended as an aid in the diagnosis of influenza from Nasopharyngeal swab specimens and should not be used as a sole basis for treatment. Nasal washings and aspirates are unacceptable for Xpert Xpress SARS-CoV-2/FLU/RSV testing.  Fact Sheet for Patients: EntrepreneurPulse.com.au  Fact Sheet for Healthcare Providers: IncredibleEmployment.be  This test is not yet approved or cleared by the [place] FDA and has been authorized for detection and/or diagnosis of SARS-CoV-2 by FDA under an Emergency Use Authorization (EUA). This EUA will remain in effect (meaning this test can be used) for the duration of the COVID-19 declaration under Section 564(b)(1) of the Act, 21 U.S.C. section 360bbb-3(b)(1), unless the authorization is terminated or revoked.  Performed at [facility], [place] [address], [place], [place] [address]   Resp Panel by RT-PCR (Flu A&B, Covid) Nasopharyngeal Swab     Status: None   Collection Time: 09/27/20 10:31 PM   Specimen: Nasopharyngeal Swab; Nasopharyngeal(NP) swabs in vial transport medium  Result Value Ref Range Status   SARS Coronavirus 2 by RT PCR NEGATIVE NEGATIVE Final    Comment: (NOTE) SARS-CoV-2 target nucleic acids are NOT DETECTED.  The SARS-CoV-2 RNA is generally detectable in upper respiratory specimens during the acute phase of infection. The lowest concentration of SARS-CoV-2 viral copies this assay can detect is 138 copies/mL. A negative result does not preclude SARS-Cov-2 infection and should not be used as the sole basis for treatment or other patient management decisions. A negative result may occur with  improper specimen collection/handling, submission of specimen other than nasopharyngeal swab, presence of viral mutation(s) within the areas targeted  by this assay, and inadequate number of viral copies(<138 copies/mL). A negative result must be combined with clinical observations, patient history, and epidemiological information. The expected result is Negative.  Fact Sheet for Patients:  EntrepreneurPulse.com.au  Fact Sheet for Healthcare Providers:  IncredibleEmployment.be  This test is no t yet approved or cleared by the [place] FDA and  has been authorized for detection and/or diagnosis  of SARS-CoV-2 by FDA under an Emergency Use Authorization (EUA). This EUA will remain  in effect (meaning this test can be used) for the duration of the COVID-19 declaration under Section 564(b)(1) of the Act, 21 U.S.C.section 360bbb-3(b)(1), unless the authorization is terminated  or revoked sooner.       Influenza A by PCR NEGATIVE NEGATIVE Final   Influenza B by PCR NEGATIVE NEGATIVE Final    Comment: (NOTE) The Xpert Xpress SARS-CoV-2/FLU/RSV plus assay is intended as an aid in the diagnosis of influenza from Nasopharyngeal swab specimens and should not be used as a sole basis for treatment. Nasal washings and aspirates are unacceptable for Xpert Xpress SARS-CoV-2/FLU/RSV testing.  Fact Sheet for Patients: EntrepreneurPulse.com.au  Fact Sheet for Healthcare Providers: IncredibleEmployment.be  This test is not yet approved or cleared by the [place] FDA and has been authorized for detection and/or diagnosis of SARS-CoV-2 by FDA under an Emergency Use Authorization (EUA). This EUA will remain in effect (meaning this test can be used) for the duration of the COVID-19 declaration under Section 564(b)(1) of the Act, 21 U.S.C. section 360bbb-3(b)(1), unless the authorization is terminated or revoked.  Performed at [facility], [place] [address] [place],  [address]      Radiology Studies: DG Chest Portable 1 View  Result Date:  09/27/2020 CLINICAL DATA:  87 year old with confusion. EXAM: PORTABLE CHEST 1 VIEW COMPARISON:  09/23/2020 FINDINGS: Stable upper normal heart size. Unchanged mediastinal contours with aortic atherosclerosis. Smaller patchy opacity at the left lung base with possible small left pleural effusion. No new or acute airspace disease. No pulmonary edema or pneumothorax. Bones under mineralized without acute osseous abnormality. IMPRESSION: Mild patchy opacity at the left lung base, unchanged from prior exam, atelectasis versus pneumonia. Possible small left pleural effusion. Electronically Signed   By: Keith Rake M.D.   On: 09/27/2020 22:08   EEG adult  Result Date: 09/28/2020 Lora Havens, MD     09/28/2020  1:56 PM Patient Name: ELIZETH SPARLING MRN: [id number] Epilepsy Attending: Lora Havens Referring Physician/Provider: Dr Lynnae Sandhoff Date: 09/28/2020 Duration: 23.59 mins Patient history:  87 y.o. female who presented with an episode of unresponsiveness and right sided weakness. EEG to evaluate for seizure Level of alertness: Awake AEDs during EEG study: None Technical aspects: This EEG study was done with scalp electrodes positioned according to the 10-20 International system of electrode placement. Electrical activity was acquired at a sampling rate of '500Hz'$  and reviewed with a high frequency filter of '70Hz'$  and a low frequency filter of '1Hz'$ . EEG data were recorded continuously and digitally stored. Description: The posterior dominant rhythm consists of 8 Hz activity of moderate voltage (25-35 uV) seen predominantly in posterior head regions, symmetric and reactive to eye opening and eye closing.  EEG showed intermittent generalized polymorphic sharply contoured 3 to 6 Hz theta-delta slowing admixed with 15-'18hz'$  generalized beta activity. Hyperventilation and photic stimulation were not performed.   ABNORMALITY - Intermittent slow, generalized IMPRESSION: This study is suggestive of mild diffuse  encephalopathy, nonspecific etiology. No seizures or epileptiform discharges were seen throughout the recording. Lora Havens   CT HEAD CODE STROKE WO CONTRAST  Result Date: 09/27/2020 CLINICAL DATA:  Code stroke.  Right facial droop and weakness EXAM: CT HEAD WITHOUT CONTRAST TECHNIQUE: Contiguous axial images were obtained from the base of the skull through the vertex without intravenous contrast. COMPARISON:  None. FINDINGS: Brain: There is no mass, hemorrhage or extra-axial collection. There is generalized  atrophy without lobar predilection. Hypodensity of the white matter is most commonly associated with chronic microvascular disease. Old infarcts in the right frontal lobe, left occipital lobe and within the basal ganglia. Vascular: No abnormal hyperdensity of the major intracranial arteries or dural venous sinuses. No intracranial atherosclerosis. Skull: The visualized skull base, calvarium and extracranial soft tissues are normal. Sinuses/Orbits: No fluid levels or advanced mucosal thickening of the visualized paranasal sinuses. No mastoid or middle ear effusion. The orbits are normal. ASPECTS [facility] Stroke Program Early CT Score) - Ganglionic level infarction (caudate, lentiform nuclei, internal capsule, insula, M1-M3 cortex): 7 - Supraganglionic infarction (M4-M6 cortex): 3 Total score (0-10 with 10 being normal): 10 IMPRESSION: 1. No acute intracranial abnormality. 2. ASPECTS is 10. 3. Old infarcts in the right frontal lobe, left occipital lobe and within the basal ganglia. These results were communicated to Lynnae Sandhoff at 8:50 pm on 09/27/2020 by text page via the [facility] messaging system. Electronically Signed   By: Ulyses Jarred M.D.   On: 09/27/2020 20:50    Scheduled Meds:  allopurinol  100 mg Oral Daily   aspirin EC  81 mg Oral q morning   atorvastatin  40 mg Oral Daily   carvedilol  6.25 mg Oral BID WC   clopidogrel  75 mg Oral Daily   felodipine  10 mg Oral Daily   furosemide  40  mg Oral Daily   And   furosemide  20 mg Oral Daily   gabapentin  300 mg Oral QHS   heparin injection (subcutaneous)  5,000 Units Subcutaneous Q8H   hydrALAZINE  25 mg Oral BID   isosorbide mononitrate  30 mg Oral Daily   multivitamin  1 tablet Oral Daily   Continuous Infusions:  cefTRIAXone (ROCEPHIN)  IV 1 g (09/28/20 0139)     LOS: 0 days   Marylu Lund, MD [facility] Pager On Amion  If 7PM-7AM, please contact night-coverage 09/28/2020, 5:52 PM

## 2020-09-28 NOTE — Procedures (Signed)
Patient Name: [name]  MRN: [id number]  Epilepsy Attending: Lora Havens  Referring Physician/Provider: Dr Lynnae Sandhoff Date: 09/28/2020 Duration: 23.59 mins  Patient history:  87 y.o. female who presented with an episode of unresponsiveness and right sided weakness. EEG to evaluate for seizure  Level of alertness: Awake  AEDs during EEG study: None  Technical aspects: This EEG study was done with scalp electrodes positioned according to the 10-20 International system of electrode placement. Electrical activity was acquired at a sampling rate of '500Hz'$  and reviewed with a high frequency filter of '70Hz'$  and a low frequency filter of '1Hz'$ . EEG data were recorded continuously and digitally stored.   Description: The posterior dominant rhythm consists of 8 Hz activity of moderate voltage (25-35 uV) seen predominantly in posterior head regions, symmetric and reactive to eye opening and eye closing.  EEG showed intermittent generalized polymorphic sharply contoured 3 to 6 Hz theta-delta slowing admixed with 15-'18hz'$  generalized beta activity. Hyperventilation and photic stimulation were not performed.     ABNORMALITY - Intermittent slow, generalized  IMPRESSION: This study is suggestive of mild diffuse encephalopathy, nonspecific etiology. No seizures or epileptiform discharges were seen throughout the recording.  [name] Barbra Sarks

## 2020-09-28 NOTE — Evaluation (Signed)
Occupational Therapy Evaluation Patient Details Name: [name] MRN: [id number] DOB: Jul 16, 1923 Today's Date: 09/28/2020    History of Present Illness 87 y/o female presented to ED on 6/14 for evaluation of sudden onset of dysarthria. CT head negative. MRI found a few small cortical/subcortical acute PCA infarcts, possible acute punctate infarct. PMH: CHF, CKD, HTN, dilated cardiomyopathy, cervical spondylosis, breast mass, chronic back pain   Clinical Impression   PTA patient was living alone in a private residence and was grossly Mod I with ADLs/IADLs with use of RW. Since d/c several days ago family has been providing recommended 24hr supervision/assist. Patient currently functioning near baseline demonstrating observed ADLs including grooming standing at sink level, LB dressing and functional mobility with Min guard. Min guard to Min A for sit to stand transfers with patient noting increased stiffness from decreased activity over last several days reporting generalized weakness. Patient with BUE strength symmetrical bilaterally, intact sensation, and mild coordination deficits (symmetrical bilaterally and likely baseline). Patient states other than feeling stiff, she feels like herself. Recommendation for return home with family with continued initial 24hr supervision/assist and HHOT. OT will continue to follow acutely.     Follow Up Recommendations  Home health OT;Supervision/Assistance - 24 hour (24 hour supervision initially upon return home to ensure she is functioning well in her home, then can reduce amount of supervision required based on her performance and needs.  Recommend direct supervision for meds and finances initially)    Equipment Recommendations  None recommended by OT    Recommendations for Other Services       Precautions / Restrictions Precautions Precautions: Fall Precaution Comments: chronic back pain, LE spasms Restrictions Weight Bearing Restrictions: No       Mobility Bed Mobility Overal bed mobility: Needs Assistance Bed Mobility: Supine to Sit     Supine to sit: Supervision     General bed mobility comments: Supervision A for safety.; mildly increased time; HOB slightly elevated.    Transfers Overall transfer level: Needs assistance Equipment used: Rolling walker (2 wheeled) Transfers: Sit to/from [facility] Sit to Stand: Min guard;Min assist Stand pivot transfers: Min guard       General transfer comment: Min A for sit to stand from EOB with increased time to rise. Patient reports stiffness from being in bed.    Balance Overall balance assessment: Needs assistance Sitting-balance support: No upper extremity supported;Feet supported Sitting balance-Leahy Scale: Good     Standing balance support: No upper extremity supported Standing balance-Leahy Scale: Fair Standing balance comment: Able to maintain static standing at sink level during grooming tasks with Min guard and no UE support.                           ADL either performed or assessed with clinical judgement   ADL Overall ADL's : Needs assistance/impaired Eating/Feeding: Independent   Grooming: Wash/dry hands;Wash/dry face;Oral care;Brushing hair;Min guard;Standing Grooming Details (indicate cue type and reason): Min guard for hand washing standing at sink level.             Lower Body Dressing: Min guard;Sit to/from stand Lower Body Dressing Details (indicate cue type and reason): Able to don socks seated EOB without external assist. Requires increasd time. Toilet Transfer: Min guard;Ambulation;Comfort height toilet;Grab bars;RW Armed forces technical officer Details (indicate cue type and reason): Simulated with transfer to recliner with RW and Min guard.         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?: Yes   Praxis Praxis Praxis tested?: Within functional limits    Pertinent Vitals/Pain Pain Assessment: No/denies pain     Hand Dominance Right   Extremity/Trunk Assessment Upper Extremity Assessment Upper Extremity Assessment: Overall WFL for tasks assessed (MMT grossly 4+/5 bilaterally (symmetrical).)   Lower Extremity Assessment Lower Extremity Assessment: Defer to PT evaluation   Cervical / Trunk Assessment Cervical / Trunk Assessment: Kyphotic   Communication     Cognition Arousal/Alertness: Awake/alert Behavior During Therapy: WFL for tasks assessed/performed Overall Cognitive Status: Within Functional Limits for tasks assessed                                 General Comments: Cognitiion appears to be at baseline.   General Comments  VSS on RA.    Exercises     Shoulder Instructions      Home Living Family/patient expects to be discharged to:: Private residence Living Arrangements: Alone Available Help at Discharge: Family;Available PRN/intermittently;Personal care attendant Type of Home: House Home Access: Level entry     Home Layout: One level     Bathroom Shower/Tub: Teacher, early years/pre: Handicapped height     Home Equipment: Environmental consultant - 2 wheels;Toilet riser;Cane - single point;Bedside commode;Grab bars - tub/shower          Prior Functioning/Environment Level of Independence: Independent with assistive device(s)        Comments: Ambulates with RW household distances. PCA 2x/week and does laundry/cleaning. Manages own medications and  cooks.  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 OT Goal Formulation: With patient Time For Goal Achievement: 10/12/20 Potential to Achieve Goals: Good ADL Goals Additional ADL Goal #1: Patient will complete ADLs with Mod I and use of RW. Additional ADL Goal #2: Patient will recall 3 energy conservation techniques in prep for ADLs/IADLs.  OT Frequency:     Barriers to D/C:            Co-evaluation              AM-PAC OT "6 Clicks" Daily Activity     Outcome Measure Help from another person eating meals?: None Help from another person taking care of personal grooming?: A Little Help from another person toileting, which includes using toliet, bedpan, or urinal?: A Little Help from another person bathing (including washing, rinsing, drying)?: A Little Help from another person to put on and taking off regular upper body clothing?: A Little Help from another person to put on and taking off regular lower body clothing?: A Little 6 Click Score: 19   End of Session Equipment Utilized During Treatment: Rolling walker Nurse Communication: Mobility status;Other (comment) (Need for second walk later  this date.)  Activity Tolerance: Patient tolerated treatment well Patient left: in chair;with call bell/phone within reach;with chair alarm set  OT Visit Diagnosis: Unsteadiness on feet (R26.81)                Time: [id number] OT Time Calculation (min): 19 min Charges:  OT General Charges $OT Visit: 1 Visit OT Evaluation $OT Eval Low Complexity: 1 Low  Felipe Paluch H. OTR/L Supplemental OT, Department of rehab services [phone]  Arun Herrod R H. 09/28/2020, 9:20 AM

## 2020-09-28 NOTE — Progress Notes (Signed)
EEG complete - results pending 

## 2020-09-28 NOTE — Progress Notes (Addendum)
STROKE TEAM PROGRESS NOTE    Interval History   No acute events overnight, patient is sitting up in the chair beside her bed with her daughter at bedside.   Patient and daughter asked to recount the events that brought her in to the hospital. Daughter called her brother to help with the history. He states that she was on the couch when she slumped back and when she did open her eyes it was as if she was staring through her children and did not have complete awareness.   Daughter and patient note that since leaving the hospital last admission her feet have been itching. They think this is due a new medication but are not sure which one.   Pertinent Lab Work and Imaging    09/23/20 CT Head WO IV Contrast 1. No evidence of acute intracranial abnormality. Moderate chronic small vessel ischemic disease with chronic infarcts as above.   09/24/20 MRI Brain WO IV Contrast There are a few small cortical/subcortical acute infarcts within the left parietal, occipital and posterior temporal lobes (PCA vascular territory). Possible additional punctate acute infarct within the posterior left frontal lobe (motor strip).   09/24/20 MR Angio Head 1. Negative for large vessel occlusion, but positive for a fetal Left PCA origin with multifocal moderate to severe Left P2 irregularity and stenosis, and a moderate stenosis of the Left ICA terminus distal to the Pcomm origin.   2. 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   09/24/20 Echocardiogram Complete   1. Left ventricular ejection fraction, by estimation, is 30 to 35%. The left ventricle has moderately decreased function. The left ventricle demonstrates global hypokinesis. Left ventricular diastolic parameters are consistent with Grade II diastolic dysfunction (pseudonormalization).   2. Right ventricular systolic function is normal. The right ventricular size is normal. There is normal  pulmonary artery systolic pressure. The estimated right ventricular systolic pressure is 0000000 mmHg.   3. The aortic valve is tricuspid. There is moderate calcification of the aortic valve. Aortic valve regurgitation is mild. Aortic valve sclerosis/calcification is present, without any evidence of aortic stenosis. Aortic regurgitation PHT measures 602 msec. Aortic valve mean gradient measures 7.3 mmHg.   4. The mitral valve is abnormal. Moderate mitral valve regurgitation. Moderate mitral annular calcification.   5. Left atrial size was moderately dilated.   6. The inferior vena cava is normal in size with greater than 50% respiratory variability, suggesting right atrial pressure of 3 mmHg.    09/24/20 CUS Right Carotid: Velocities in the right ICA are consistent with a 80-99% stenosis.   Left Carotid: Velocities in the left ICA are consistent with a 80-99% stenosis.   Vertebrals: Bilateral vertebral arteries demonstrate antegrade flow.   09/28/20 CT Head  1. No acute intracranial abnormality. 2. ASPECTS is 10. 3. Old infarcts in the right frontal lobe, left occipital lobe and within the basal ganglia.  Physical Examination   Constitutional: Calm, appropriate for condition Cardiovascular: Normal RR Respiratory: No increased WOB   Mental status: AAOX4, following commands Speech: Fluent with repetition and naming intact, No dysarthria  Cranial nerves: EOMI, VFF, Face symmetric, Tongue midline  Motor: Normal bulk and tone. No drift.   Dlt Bic Tri FgS Grp HF  KnF KnE PIF DoF  R '5 5 5 5 5 5 5 5 5 5  '$ L '5 5 5 5 5 5 5 5 5 5  '$ Sensory: Intact to light tough throughout  Coordination: Intact FNF  Gait: Deferred    NIHSS: 0   Assessment and Plan   Ms. [name] is a 87 y.o. female w/pmh of CHF, CKD, HTN, dilated cardiomyopathy, cervical spondylosis and breast mass who presents with an episode of unresponsiveness and right sided weakness. By the time she arrived to the ED her sx had  resolved. CTH was done w/NAICP. She did not receive IVTPA given NIHSS 0 and was not a candidate for mechanical thrombectomy.   #Unresponsiveness and Right Sided Weakness  Patient is known to the stroke service as she was admitted from 6/14 to 6/16 for left PCA/MCA territory stroke felt to be in the setting of LICA stenosis. This admission she presents with an episode of unresponsiveness; family describes her sitting on the couch when she became somnolent/sleepy. When she did open her eyes it was as if she was starting through her family members. She was not responsive to questions at the time. Of note, when EMS arrived the patients blood pressure was noted to be 80/40. Her symptoms are concerning for seizure. Will proceed with EEG at this time. Will not repeat stroke work up as she had a complete stroke work up very recently.  - Continue DAPT 21 days followed by Aspirin monotherapy for stroke prevention  - Continue Atorvastatin 40 mg for stroke prevention  - Liberal SBP goal [id number] given LICA stenosis  - Follow up on EEG findings  - Neurology follow up at discharge   Hospital day # 0  Ruta Hinds, NP  [facility] Nurse Practitioner Patient seen and discussed with attending physician Dr. Carilyn Goodpasture MD NOTE I have personally obtained history,examined this patient, reviewed notes, independently viewed imaging studies, participated in medical decision making and plan of care.ROS completed by me personally and pertinent positives fully documented  I have made any additions or clarifications directly to the above note. Agree with note above.  Patient had been recently discharged with symptomatic high-grade left carotid stenosis with small left subcortical infarcts.  She returns with transient episode of unresponsiveness and staring followed by right-sided weakness with documented hypertension.  Neurological exam is nonfocal and she is back to her baseline.  Family had refused carotid  revascularization options at last visit.  I do not believe further neurovascular testing is necessary but will check an EEG for cortical irritability and if found may need seizure medications.  Continue ongoing antiplatelet therapy and aggressive risk factor modification.  Long discussion with the patient, her daughter at the bedside and her son over the phone and answered questions.  Discussed with Dr. Sherrian Divers.  Greater than 50% time during this 35-minute visit was spent on counseling and coordination of care about her symptomatic carotid stenosis and possible seizure versus TIA and answering questions  Antony Contras, MD Medical Director [facility] Pager: [phone] 09/28/2020 1:48 PM   To contact Stroke Continuity provider, please refer to [email or web address] After hours, contact General Neurology

## 2020-09-28 NOTE — ED Provider Notes (Signed)
.  Critical Care  Date/Time: 09/28/2020 10:19 AM Performed by: Wyvonnia Dusky, MD Authorized by: Wyvonnia Dusky, MD   Critical care provider statement:    Critical care time (minutes):  35   Critical care was necessary to treat or prevent imminent or life-threatening deterioration of the following conditions:  CNS failure or compromise   Critical care was time spent personally by me on the following activities:  Discussions with consultants, evaluation of patient's response to treatment, examination of patient, ordering and performing treatments and interventions, ordering and review of laboratory studies, ordering and review of radiographic studies, pulse oximetry, re-evaluation of patient's condition, obtaining history from patient or surrogate and review of old charts Comments:     Consideration of tPA for stroke alert - patient is not a candidate    Wyvonnia Dusky, MD 09/28/20 1019

## 2020-09-28 NOTE — Evaluation (Signed)
Physical Therapy Evaluation Patient Details Name: [name] MRN: [id number] DOB: 07-08-1923 Today's Date: 09/28/2020   History of Present Illness  87 y/o female presented to ED on 6/14 for evaluation of sudden onset of dysarthria. CT head negative. MRI found a few small cortical/subcortical acute PCA infarcts, possible acute punctate infarct. PMH: CHF, CKD, HTN, dilated cardiomyopathy, cervical spondylosis, breast mass, chronic back pain  Clinical Impression  Pt admitted with/for R sided weakness.  Pt does indeed show R LE weakness and needing min guard assist for safety during OOB mobility..  Pt currently limited functionally due to the problems listed below.  (see problems list.)  Pt will benefit from PT to maximize function and safety to be able to get home safely with available assist.  Recommended to the family and pt that she needs initially up to 24 hour assist/supervision for safety to be withdrawn as pt shows to be safe with intermittent assist. .     Follow Up Recommendations Home health PT;Supervision/Assistance - 24 hour    Equipment Recommendations  None recommended by PT    Recommendations for Other Services       Precautions / Restrictions Precautions Precautions: Fall Precaution Comments: chronic back pain, LE spasms      Mobility  Bed Mobility Overal bed mobility: Needs Assistance Bed Mobility: Supine to Sit     Supine to sit: Supervision     General bed mobility comments: Supervision A for safety.; mildly increased time; HOB slightly elevated.    Transfers Overall transfer level: Needs assistance Equipment used: Rolling walker (2 wheeled) Transfers: Sit to/from [facility] Sit to Stand: Min guard;Min assist Stand pivot transfers: Min guard       General transfer comment: Min A for sit to stand from EOB with increased time to rise. Patient reports stiffness from being in bed.  Ambulation/Gait Ambulation/Gait assistance: Min  guard Gait Distance (Feet): 60 Feet Assistive device: Rolling walker (2 wheeled) Gait Pattern/deviations: Step-through pattern;Decreased stride length Gait velocity: decreased Gait velocity interpretation: <1.8 ft/sec, indicate of risk for recurrent falls General Gait Details: slow and steady, flexed posture,  Stairs            Wheelchair Mobility    Modified Rankin (Stroke Patients Only) Modified Rankin (Stroke Patients Only) Modified Rankin: Moderate disability     Balance Overall balance assessment: Needs assistance Sitting-balance support: No upper extremity supported;Feet supported Sitting balance-Leahy Scale: Good     Standing balance support: No upper extremity supported Standing balance-Leahy Scale: Fair Standing balance comment: Able to maintain static standing without UE assist                             Pertinent Vitals/Pain Pain Assessment: No/denies pain    Home Living Family/patient expects to be discharged to:: Private residence Living Arrangements: Alone Available Help at Discharge: Family;Available PRN/intermittently;Personal care attendant Type of Home: House Home Access: Level entry     Home Layout: One level Home Equipment: Walker - 2 wheels;Toilet riser;Cane - single point;Bedside commode;Grab bars - tub/shower      Prior Function Level of Independence: Independent with assistive device(s)         Comments: Ambulates with RW household distances. PCA 2x/week and does laundry/cleaning. Manages own medications and cooks.  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  Extremity/Trunk Assessment   Upper Extremity Assessment Upper Extremity Assessment: Overall WFL for tasks assessed    Lower Extremity Assessment Lower Extremity Assessment: RLE deficits/detail RLE Deficits / Details: generalized weakness 3- hip  flexion o/w 3 to 3+ RLE Sensation: WNL    Cervical / Trunk Assessment Cervical / Trunk Assessment: Kyphotic  Communication      Cognition Arousal/Alertness: Awake/alert Behavior During Therapy: WFL for tasks assessed/performed Overall Cognitive Status: Within Functional Limits for tasks assessed                                 General Comments: Cognitiion appears to be at baseline.      General Comments General comments (skin integrity, edema, etc.): vss    Exercises     Assessment/Plan    PT Assessment Patient needs continued PT services  PT Problem List Decreased strength;Decreased activity tolerance;Decreased balance;Decreased mobility       PT Treatment Interventions DME instruction;Gait training;Functional mobility training;Therapeutic activities;Therapeutic exercise;Balance training;Patient/family education    PT Goals (Current goals can be found in the Care Plan section)  Acute Rehab PT Goals Patient Stated Goal: To return home. PT Goal Formulation: With patient Time For Goal Achievement: 10/05/20 Potential to Achieve Goals: Fair    Frequency Min 4X/week   Barriers to discharge        Co-evaluation               AM-PAC PT "6 Clicks" Mobility  Outcome Measure Help needed turning from your back to your side while in a flat bed without using bedrails?: A Little Help needed moving from lying on your back to sitting on the side of a flat bed without using bedrails?: A Little Help needed moving to and from a bed to a chair (including a wheelchair)?: A Little Help needed standing up from a chair using your arms (e.g., wheelchair or bedside chair)?: A Little Help needed to walk in hospital room?: A Little Help needed climbing 3-5 steps with a railing? : A Little 6 Click Score: 18    End of Session   Activity Tolerance: Patient tolerated treatment well Patient left: in chair;with chair alarm set;with family/visitor present Nurse  Communication: Mobility status PT Visit Diagnosis: Unsteadiness on feet (R26.81);Difficulty in walking, not elsewhere classified (R26.2)    Time: [id number] PT Time Calculation (min) (ACUTE ONLY): 25 min   Charges:              09/28/2020  Ginger Carne., PT Acute Rehabilitation Services [phone]  (pager) [phone]  (office)  Tessie Fass Raffaele Derise 09/28/2020, 3:17 PM

## 2020-09-28 NOTE — Plan of Care (Signed)

## 2020-09-29 ENCOUNTER — Other Ambulatory Visit (HOSPITAL_COMMUNITY): Payer: Self-pay

## 2020-09-29 DIAGNOSIS — N179 Acute kidney failure, unspecified: Secondary | ICD-10-CM | POA: Diagnosis not present

## 2020-09-29 DIAGNOSIS — I6330 Cerebral infarction due to thrombosis of unspecified cerebral artery: Secondary | ICD-10-CM | POA: Diagnosis not present

## 2020-09-29 LAB — COMPREHENSIVE METABOLIC PANEL
ALT: 28 U/L (ref 0–44)
AST: 35 U/L (ref 15–41)
Albumin: 3.1 g/dL — ABNORMAL LOW (ref 3.5–5.0)
Alkaline Phosphatase: 76 U/L (ref 38–126)
Anion gap: 14 (ref 5–15)
BUN: 95 mg/dL — ABNORMAL HIGH (ref 8–23)
CO2: 26 mmol/L (ref 22–32)
Calcium: 8.7 mg/dL — ABNORMAL LOW (ref 8.9–10.3)
Chloride: 97 mmol/L — ABNORMAL LOW (ref 98–111)
Creatinine, Ser: 3.86 mg/dL — ABNORMAL HIGH (ref 0.44–1.00)
GFR, Estimated: 10 mL/min — ABNORMAL LOW (ref >60)
Glucose, Bld: 92 mg/dL (ref 70–99)
Potassium: 4.3 mmol/L (ref 3.5–5.1)
Sodium: 137 mmol/L (ref 135–145)
Total Bilirubin: 0.3 mg/dL (ref 0.3–1.2)
Total Protein: 6.5 g/dL (ref 6.5–8.1)

## 2020-09-29 LAB — CBC
HCT: 29.8 % — ABNORMAL LOW (ref 36.0–46.0)
Hemoglobin: 9.6 g/dL — ABNORMAL LOW (ref 12.0–15.0)
MCH: 30.3 pg (ref 26.0–34.0)
MCHC: 32.2 g/dL (ref 30.0–36.0)
MCV: 94 fL (ref 80.0–100.0)
Platelets: 158 10*3/uL (ref 150–400)
RBC: 3.17 MIL/uL — ABNORMAL LOW (ref 3.87–5.11)
RDW: 15 % (ref 11.5–15.5)
WBC: 8.2 10*3/uL (ref 4.0–10.5)
nRBC: 0 % (ref 0.0–0.2)

## 2020-09-29 MED ORDER — CEFDINIR 300 MG PO CAPS
300.0000 mg | ORAL_CAPSULE | Freq: Two times a day (BID) | ORAL | 0 refills | Status: AC
  Filled 2020-09-29: qty 8, 4d supply, fill #0

## 2020-09-29 MED ORDER — CARVEDILOL 3.125 MG PO TABS: 3.1250 mg | ORAL_TABLET | Freq: Two times a day (BID) | ORAL | Status: DC

## 2020-09-29 MED ORDER — MUSCLE RUB 10-15 % EX CREA
TOPICAL_CREAM | Freq: Two times a day (BID) | CUTANEOUS | Status: DC | PRN
  Filled 2020-09-29: qty 85

## 2020-09-29 MED ORDER — CARVEDILOL 6.25 MG PO TABS: 6.2500 mg | ORAL_TABLET | Freq: Two times a day (BID) | ORAL | Status: DC

## 2020-09-29 NOTE — Care Management Obs Status (Signed)
[facility] NOTIFICATION   Patient Details  Name: [name] MRN: [id number] Date of Birth: 1923-05-07   Medicare Observation Status Notification Given:  Yes    Angelita Ingles, RN 09/29/2020, 2:02 PM

## 2020-09-29 NOTE — Discharge Summary (Signed)
Physician Discharge Summary  [name] [id number] DOB: [birth year] DOA: 09/27/2020  PCP: Troy Sine, MD Donnie Coffin, NP is listed as PCP in Care Everywhere  Admit date: 09/27/2020 Discharge date: 09/29/2020  Admitted From: Home Disposition:  Home  Recommendations for Outpatient Follow-up:  Follow up with PCP in 1-2 weeks Follow up with Nephrology as scheduled Please monitor blood pressure and titrate bp meds, avoiding hypotension. Goal SBP>100  Home Health:PT   Discharge Condition:Improved CODE STATUS:DNR Diet recommendation: Heart healthy   Brief/Interim Summary: 87 y.o. female with medical history significant for HFrEF 30 to 35%, dilated cardiomyopathy, essential hypertension, CKD stage IV, history of breast mass, chronic back pain, ventral hernia with previous repair who was recently discharged from the hospital 2 days prior to this presentation after diagnosis of stroke, suspected embolic in nature.  She presents this time due to right-sided weakness after eating her dinner and shortly falling asleep.on the day of presentation.  Per her son at bedside, her daughter had concern for right sided weakness.  Unknown when last known well.  EMS was activated.  She presented to the ED as a code stroke  Discharge Diagnoses:  Active Problems:   Acute right-sided weakness   Cerebral thrombosis with cerebral infarction  Acute right-sided weakness, improved. Presented as a code stroke CT head nonacute Seen by Neurology, recommendation for EEG EEG reviewed with Neurology, found to be neg for seizure Cont with PT OT evaluation Suspect secondary to UTI given below vs presenting hypotension Discussed with Neurology. Recommendation to keep sbp>100, see below   Elevated troponin, suspect demand ischemia Presenting troponin S 88 No reported chest pain 12 EKG with no evidence of acute ischemia Repeat troponin trending down   UTI ruled in UA positive for blood pyuria Continued  on Rocephin empirically, discharged on omnicef Urine culture had grown klebsiella species Clinically improved   Isolated elevated AST Presented with AST of 54 Monitor for now   CKD stage IV Presented with creatinine of 4.02 Continue to monitor for toxic agents, dehydration, hypotension.   Anemia of chronic disease in the setting of advanced CKD. No overt bleeding. Hgb remain stable at this time, labs reviewed   Chronic combined diastolic and systolic CHF Noted to be euvolemic on exam Continue home regimen Strict I's and O's and daily weight.  HTN Presented with concerns of sbp in the 80's Initially tolerated coreg, felodipine, hydralazine, imdur Towards day of discharge, sbp noted to be soft with sbp in the mid-90's -Recommend holding hydralazine and imdur, continue coreg and felodipine -Have recommended patient follow up with PCP to titrate BP regimen as outpatient to ensure normotension and to keep sbp>100    Discharge Instructions   Allergies as of 09/29/2020   No Known Allergies      Medication List     STOP taking these medications    hydrALAZINE 25 MG tablet Commonly known as: APRESOLINE   isosorbide mononitrate 30 MG 24 hr tablet Commonly known as: IMDUR   Potassium Chloride ER 20 MEQ Tbcr       TAKE these medications    acetaminophen 325 MG tablet Commonly known as: TYLENOL Take 650 mg by mouth every 6 (six) hours as needed for mild pain.   allopurinol 100 MG tablet Commonly known as: ZYLOPRIM Take 100 mg by mouth daily.   aspirin EC 81 MG tablet Take 1 tablet (81 mg total) by mouth every morning.   atorvastatin 40 MG tablet Commonly known as: LIPITOR Take 1  tablet (40 mg total) by mouth daily.   calcitRIOL 0.5 MCG capsule Commonly known as: ROCALTROL Take 0.5 mcg by mouth daily.   carvedilol 6.25 MG tablet Commonly known as: COREG Take 1 tablet (6.25 mg total) by mouth 2 (two) times daily with a meal. Please schedule an appointment  for further refills   cefdinir 300 MG capsule Commonly known as: OMNICEF Take 1 capsule (300 mg total) by mouth 2 (two) times daily for 4 days.   clopidogrel 75 MG tablet Commonly known as: PLAVIX Take 1 tablet (75 mg total) by mouth daily.   felodipine 10 MG 24 hr tablet Commonly known as: PLENDIL Take 10 mg by mouth daily.   furosemide 40 MG tablet Commonly known as: LASIX Take 0.5-1 tablets (20-40 mg total) by mouth 2 (two) times daily. Take 40 mg in the morning and 20 mg in afternoon, may take extra 20 mg dose in the afternoon as needed What changed:  when to take this additional instructions   gabapentin 300 MG capsule Commonly known as: NEURONTIN Take 300 mg by mouth at bedtime.   PRESERVISION AREDS PO Take 1 capsule by mouth daily.        Follow-up Information     Troy Sine, MD Follow up in 1 week(s).   Specialty: Cardiology Why: Hospital follow up Contact information: [address] [place] [address] [phone]         Madelon Lips, MD Follow up.   Specialty: Nephrology Contact information: [place] [address] [phone]                No Known Allergies  Consultations: Neurology  Procedures/Studies: MR ANGIO HEAD WO CONTRAST  Result Date: 09/24/2020 CLINICAL DATA:  87 year old female with slurred speech. Small cortical and white matter infarcts in the left hemisphere on MRI earlier today, most apparent in the left PCA territory. EXAM: MRA HEAD WITHOUT CONTRAST TECHNIQUE: Angiographic images of the Circle of Willis were acquired using MRA technique without intravenous contrast. COMPARISON:  Brain MRI 0548 hours today. FINDINGS: No intracranial mass effect or ventriculomegaly. Antegrade flow in the posterior circulation with dominant left vertebral artery. Mild distal vertebral artery irregularity without significant stenosis. Bilateral PICA remain patent. Patent vertebrobasilar junction and basilar  artery with mild irregularity but no significant stenosis. SCA and right PCA origins are patent. There is a fetal left PCA origin. Right posterior communicating artery is diminutive or absent. Multifocal moderate to severe irregularity and stenosis of the left PCA P2 on series 255, image 8. Distal left PCA branch flow is maintained. Comparatively mild right PCA P2 irregularity. Distal right PCA branches are within normal limits. Antegrade flow in both ICA siphons. Long segment mild to moderate left siphon stenosis beginning at the anterior genu, with more pronounced moderate left ICA terminus stenosis distal to the patent left posterior communicating artery origin (series 252, image 12). The left ACA A1 is diminutive or absent. The left MCA M1 remains patent to the bifurcation without stenosis. Left MCA branches appear within normal limits. Contralateral right ICA siphon with only mild supraclinoid stenosis, but small 2 mm aneurysm of the anterior distal ICA on series 2, image 101. Right ICA terminus, right MCA and ACA origins are patent. Anterior communicating artery is within normal limits. Azygos type initial ACA A2 anatomy with a trifurcation and visible ACA branches within normal limits. Right MCA M1 segment and trifurcation are patent without stenosis. Visible right MCA branches are within normal limits.  IMPRESSION: 1. Negative for large vessel occlusion, but positive for a fetal Left PCA origin with multifocal moderate to severe Left P2 irregularity and stenosis, and a moderate stenosis of the Left ICA terminus distal to the Pcomm origin. 2. 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 Electronically Signed   By: Genevie Ann M.D.   On: 09/24/2020 10:19   MR BRAIN WO CONTRAST  Result Date: 09/24/2020 CLINICAL DATA:  Stroke suspected. Additional provided: Slurred speech. EXAM: MRI HEAD WITHOUT CONTRAST TECHNIQUE: Multiplanar, multiecho pulse  sequences of the brain and surrounding structures were obtained without intravenous contrast. COMPARISON:  Noncontrast head CT 09/23/2020.  Brain MRI 09/16/2019. FINDINGS: Brain: Mild-to-moderate generalized cerebral atrophy. Comparatively mild cerebellar atrophy. There are a few small cortical/subcortical acute infarcts within the left parietal, occipital and posterior temporal lobes (PCA vascular territory). Possible additional punctate acute infarct within the posterior left frontal lobe (motor strip). Redemonstrated chronic cortically based infarcts within the right frontal and left occipital lobes. As before, there is a small amount of chronic hemosiderin deposition at site of the right frontal and left occipital lobe infarcts, as well as within the right occipital lobe. Moderate multifocal T2/FLAIR hyperintensity within the cerebral white matter and pons, nonspecific but compatible with chronic small vessel ischemic disease. Redemonstrated chronic lacunar infarct within the left thalamus. Redemonstrated small chronic infarcts within the bilateral cerebellar hemispheres. No evidence of intracranial mass. No extra-axial fluid collection. No midline shift. Vascular: Expected proximal arterial flow voids. Skull and upper cervical spine: No focal suspicious marrow lesion. Susceptibility artifact arising from hardware within the upper cervical spine. Sinuses/Orbits: Visualized orbits show no acute finding. Trace bilateral ethmoid sinus mucosal thickening. IMPRESSION: There are a few small cortical/subcortical acute infarcts within the left parietal, occipital and posterior temporal lobes (PCA vascular territory). Possible additional punctate acute infarct within the posterior left frontal lobe (motor strip). Otherwise stable non-contrast MRI appearance of the brain as compared to 09/16/2019. Chronic cortically based infarcts within the right frontal and left occipital lobes. Chronic hemosiderin deposition at site of  these infarcts and within the right occipital lobe. Moderate chronic small vessel ischemic changes within the cerebral white matter and pons. Chronic left thalamic lacunar infarct. Small chronic infarcts within the bilateral cerebellar hemispheres. Electronically Signed   By: Kellie Simmering DO   On: 09/24/2020 08:20   DG Chest Portable 1 View  Result Date: 09/27/2020 CLINICAL DATA:  87 year old with confusion. EXAM: PORTABLE CHEST 1 VIEW COMPARISON:  09/23/2020 FINDINGS: Stable upper normal heart size. Unchanged mediastinal contours with aortic atherosclerosis. Smaller patchy opacity at the left lung base with possible small left pleural effusion. No new or acute airspace disease. No pulmonary edema or pneumothorax. Bones under mineralized without acute osseous abnormality. IMPRESSION: Mild patchy opacity at the left lung base, unchanged from prior exam, atelectasis versus pneumonia. Possible small left pleural effusion. Electronically Signed   By: Keith Rake M.D.   On: 09/27/2020 22:08   DG Chest Portable 1 View  Result Date: 09/23/2020 CLINICAL DATA:  Code stroke.  New slurred speech. EXAM: PORTABLE CHEST 1 VIEW COMPARISON:  08/08/2016 FINDINGS: Heart size and pulmonary vascularity are normal for technique. Slight infiltration or atelectasis in the left lung base. No pleural effusions. No pneumothorax. Calcified and tortuous aorta. Degenerative changes in the spine and shoulders. IMPRESSION: Slight infiltration or atelectasis in the left base. Electronically Signed   By: Lucienne Capers M.D.   On: 09/23/2020  17:23   EEG adult  Result Date: 09/28/2020 Lora Havens, MD     09/28/2020  1:56 PM Patient Name: [name] MRN: [id number] Epilepsy Attending: Lora Havens Referring Physician/Provider: Dr Lynnae Sandhoff Date: 09/28/2020 Duration: 23.59 mins Patient history:  87 y.o. female who presented with an episode of unresponsiveness and right sided weakness. EEG to evaluate for seizure Level  of alertness: Awake AEDs during EEG study: None Technical aspects: This EEG study was done with scalp electrodes positioned according to the 10-20 International system of electrode placement. Electrical activity was acquired at a sampling rate of '500Hz'$  and reviewed with a high frequency filter of '70Hz'$  and a low frequency filter of '1Hz'$ . EEG data were recorded continuously and digitally stored. Description: The posterior dominant rhythm consists of 8 Hz activity of moderate voltage (25-35 uV) seen predominantly in posterior head regions, symmetric and reactive to eye opening and eye closing.  EEG showed intermittent generalized polymorphic sharply contoured 3 to 6 Hz theta-delta slowing admixed with 15-'18hz'$  generalized beta activity. Hyperventilation and photic stimulation were not performed.   ABNORMALITY - Intermittent slow, generalized IMPRESSION: This study is suggestive of mild diffuse encephalopathy, nonspecific etiology. No seizures or epileptiform discharges were seen throughout the recording. Lora Havens   ECHOCARDIOGRAM COMPLETE  Result Date: 09/24/2020    ECHOCARDIOGRAM REPORT   Patient Name:   [name] Date of Exam: 09/24/2020 Medical Rec #:  [id number]      Height:       62.0 in Accession #:    [id number]     Weight:       161.8 lb Date of Birth:  11/30/1923      BSA:          1.747 m Patient Age:    87 years       BP:           150/63 mmHg Patient Gender: F              HR:           67 bpm. Exam Location:  Inpatient Procedure: 2D Echo, Cardiac Doppler and Color Doppler Indications:    Stroke I63.9  History:        Patient has prior history of Echocardiogram examinations, most                 recent 06/09/2016. Cardiomyopathy; Risk Factors:Hypertension.  Sonographer:    Jonelle Sidle Dance Referring Phys: [place]  1. Left ventricular ejection fraction, by estimation, is 30 to 35%. The left ventricle has moderately decreased function. The left ventricle demonstrates global  hypokinesis. Left ventricular diastolic parameters are consistent with Grade II diastolic dysfunction (pseudonormalization).  2. Right ventricular systolic function is normal. The right ventricular size is normal. There is normal pulmonary artery systolic pressure. The estimated right ventricular systolic pressure is 0000000 mmHg.  3. The aortic valve is tricuspid. There is moderate calcification of the aortic valve. Aortic valve regurgitation is mild. Aortic valve sclerosis/calcification is present, without any evidence of aortic stenosis. Aortic regurgitation PHT measures 602 msec. Aortic valve mean gradient measures 7.3 mmHg.  4. The mitral valve is abnormal. Moderate mitral valve regurgitation. Moderate mitral annular calcification.  5. Left atrial size was moderately dilated.  6. The inferior vena cava is normal in size with greater than 50% respiratory variability, suggesting right atrial pressure of 3 mmHg. Comparison(s): A prior study was performed on 06/09/2014. Prior images reviewed side by side.  LVEF has improved. FINDINGS  Left Ventricle: Left ventricular ejection fraction, by estimation, is 30 to 35%. The left ventricle has moderately decreased function. The left ventricle demonstrates global hypokinesis. The left ventricular internal cavity size was normal in size. There is no left ventricular hypertrophy. Left ventricular diastolic parameters are consistent with Grade II diastolic dysfunction (pseudonormalization). Right Ventricle: The right ventricular size is normal. No increase in right ventricular wall thickness. Right ventricular systolic function is normal. There is normal pulmonary artery systolic pressure. The tricuspid regurgitant velocity is 2.58 m/s, and  with an assumed right atrial pressure of 3 mmHg, the estimated right ventricular systolic pressure is 0000000 mmHg. Left Atrium: Left atrial size was moderately dilated. Right Atrium: Right atrial size was normal in size. Pericardium: There is no  evidence of pericardial effusion. Mitral Valve: The mitral valve is abnormal. Moderate mitral annular calcification. Moderate mitral valve regurgitation. MV peak gradient, 11.4 mmHg. The mean mitral valve gradient is 4.0 mmHg with average heart rate of 61 bpm. Tricuspid Valve: The tricuspid valve is normal in structure. Tricuspid valve regurgitation is trivial. No evidence of tricuspid stenosis. Aortic Valve: The aortic valve is tricuspid. There is moderate calcification of the aortic valve. Aortic valve regurgitation is mild. Aortic regurgitation PHT measures 602 msec. Mild to moderate aortic valve sclerosis/calcification is present, without any evidence of aortic stenosis. Aortic valve mean gradient measures 7.3 mmHg. Aortic valve peak gradient measures 13.7 mmHg. Aortic valve area, by VTI measures 1.65 cm. Pulmonic Valve: The pulmonic valve was not well visualized. Pulmonic valve regurgitation is not visualized. No evidence of pulmonic stenosis. Aorta: The aortic root and ascending aorta are structurally normal, with no evidence of dilitation. Venous: The inferior vena cava is normal in size with greater than 50% respiratory variability, suggesting right atrial pressure of 3 mmHg. IAS/Shunts: The atrial septum is grossly normal.  LEFT VENTRICLE PLAX 2D LVIDd:         4.90 cm  Diastology LVIDs:         4.10 cm  LV e' medial:    3.26 cm/s LV PW:         1.00 cm  LV E/e' medial:  35.0 LV IVS:        1.10 cm  LV e' lateral:   5.00 cm/s LVOT diam:     2.10 cm  LV E/e' lateral: 22.8 LV SV:         71 LV SV Index:   41 LVOT Area:     3.46 cm  RIGHT VENTRICLE            IVC RV Basal diam:  2.70 cm    IVC diam: 1.40 cm RV S prime:     7.94 cm/s TAPSE (M-mode): 1.9 cm LEFT ATRIUM             Index       RIGHT ATRIUM           Index LA diam:        4.40 cm 2.52 cm/m  RA Area:     14.70 cm LA Vol (A2C):   63.5 ml 36.35 ml/m RA Volume:   37.20 ml  21.29 ml/m LA Vol (A4C):   69.7 ml 39.89 ml/m LA Biplane Vol: 70.2 ml  40.18 ml/m  AORTIC VALVE AV Area (Vmax):    1.79 cm AV Area (Vmean):   1.64 cm AV Area (VTI):     1.65 cm AV Vmax:  185.00 cm/s AV Vmean:          125.333 cm/s AV VTI:            0.431 m AV Peak Grad:      13.7 mmHg AV Mean Grad:      7.3 mmHg LVOT Vmax:         95.35 cm/s LVOT Vmean:        59.200 cm/s LVOT VTI:          0.206 m LVOT/AV VTI ratio: 0.48 AI PHT:            602 msec  AORTA Ao Root diam: 3.20 cm Ao Asc diam:  3.10 cm MITRAL VALVE                TRICUSPID VALVE MV Area (PHT): 1.98 cm     TR Peak grad:   26.6 mmHg MV Area VTI:   1.61 cm     TR Vmax:        258.00 cm/s MV Peak grad:  11.4 mmHg MV Mean grad:  4.0 mmHg     SHUNTS MV Vmax:       1.69 m/s     Systemic VTI:  0.21 m MV Vmean:      98.2 cm/s    Systemic Diam: 2.10 cm MV Decel Time: 384 msec MV E velocity: 114.00 cm/s MV A velocity: 177.00 cm/s MV E/A ratio:  0.64 Rudean Haskell MD Electronically signed by Rudean Haskell MD Signature Date/Time: 09/24/2020/1:28:02 PM    Final    CT HEAD CODE STROKE WO CONTRAST  Result Date: 09/27/2020 CLINICAL DATA:  Code stroke.  Right facial droop and weakness EXAM: CT HEAD WITHOUT CONTRAST TECHNIQUE: Contiguous axial images were obtained from the base of the skull through the vertex without intravenous contrast. COMPARISON:  None. FINDINGS: Brain: There is no mass, hemorrhage or extra-axial collection. There is generalized atrophy without lobar predilection. Hypodensity of the white matter is most commonly associated with chronic microvascular disease. Old infarcts in the right frontal lobe, left occipital lobe and within the basal ganglia. Vascular: No abnormal hyperdensity of the major intracranial arteries or dural venous sinuses. No intracranial atherosclerosis. Skull: The visualized skull base, calvarium and extracranial soft tissues are normal. Sinuses/Orbits: No fluid levels or advanced mucosal thickening of the visualized paranasal sinuses. No mastoid or middle ear  effusion. The orbits are normal. ASPECTS [facility] Stroke Program Early CT Score) - Ganglionic level infarction (caudate, lentiform nuclei, internal capsule, insula, M1-M3 cortex): 7 - Supraganglionic infarction (M4-M6 cortex): 3 Total score (0-10 with 10 being normal): 10 IMPRESSION: 1. No acute intracranial abnormality. 2. ASPECTS is 10. 3. Old infarcts in the right frontal lobe, left occipital lobe and within the basal ganglia. These results were communicated to Lynnae Sandhoff at 8:50 pm on 09/27/2020 by text page via the [facility] messaging system. Electronically Signed   By: Ulyses Jarred M.D.   On: 09/27/2020 20:50   CT HEAD CODE STROKE WO CONTRAST  Result Date: 09/23/2020 CLINICAL DATA:  Code stroke.  Right-sided weakness.  Slurred speech. EXAM: CT HEAD WITHOUT CONTRAST TECHNIQUE: Contiguous axial images were obtained from the base of the skull through the vertex without intravenous contrast. COMPARISON:  Head MRI 09/16/2019 FINDINGS: Brain: There is no evidence of an acute infarct, intracranial hemorrhage, mass, midline shift, or extra-axial fluid collection. Cerebral atrophy is within normal limits for age. Chronic cortical infarcts are again noted in the right frontal and left occipital lobes, and there is  also an unchanged chronic lacunar infarct in the left thalamus. Patchy hypodensities in the cerebral white matter bilaterally are nonspecific but compatible with moderate chronic small vessel ischemic disease. Vascular: Calcified atherosclerosis at the skull base. No hyperdense vessel. Skull: No fracture or suspicious osseous lesion. Sinuses/Orbits: Visualized paranasal sinuses and mastoid air cells are clear. Bilateral cataract extraction. Other: None. ASPECTS [facility] Stroke Program Early CT Score) - Ganglionic level infarction (caudate, lentiform nuclei, internal capsule, insula, M1-M3 cortex): 7 - Supraganglionic infarction (M4-M6 cortex): 3 Total score (0-10 with 10 being normal): 10 IMPRESSION: 1. No  evidence of acute intracranial abnormality. 2. ASPECTS is 10. 3. Moderate chronic small vessel ischemic disease with chronic infarcts as above. These results were communicated to Dr. Cheral Marker at 3:55 pm on 09/23/2020 by text page via the [facility] messaging system. Electronically Signed   By: Logan Bores M.D.   On: 09/23/2020 15:55   VAS US CAROTID (at [facility] and [facility] only)  Result Date: 09/24/2020 Carotid Arterial Duplex Study Patient Name:  [name]  Date of Exam:   09/24/2020 Medical Rec #: [id number]       Accession #:    [id number] Date of Birth: May 26, 1923       Patient Gender: F Patient Age:   5Y Exam Location:  [facility] Procedure:      VAS US CAROTID Referring Phys: [id number] Elwyn Reach --------------------------------------------------------------------------------  Indications:       CVA. Risk Factors:      Hypertension. Comparison Study:  no prior Performing Technologist: Archie Patten RVS  Examination Guidelines: A complete evaluation includes B-mode imaging, spectral Doppler, color Doppler, and power Doppler as needed of all accessible portions of each vessel. Bilateral testing is considered an integral part of a complete examination. Limited examinations for reoccurring indications may be performed as noted.  Right Carotid Findings: +----------+-------+-------+--------+---------------------------------+--------+           PSV    EDV    StenosisPlaque Description               Comments           cm/s   cm/s                                                     +----------+-------+-------+--------+---------------------------------+--------+ CCA Prox  54     12             heterogenous                              +----------+-------+-------+--------+---------------------------------+--------+ CCA Distal49     16             heterogenous                              +----------+-------+-------+--------+---------------------------------+--------+ ICA Prox  450     187    80-99%  heterogenous, calcific and                                                irregular                                 +----------+-------+-------+--------+---------------------------------+--------+  ICA Mid   368    198                                                      +----------+-------+-------+--------+---------------------------------+--------+ ICA Distal79     20                                                       +----------+-------+-------+--------+---------------------------------+--------+ ECA       46                                                              +----------+-------+-------+--------+---------------------------------+--------+ +----------+--------+-------+--------+-------------------+           PSV cm/sEDV cmsDescribeArm Pressure (mmHG) +----------+--------+-------+--------+-------------------+ [id number]                                         +----------+--------+-------+--------+-------------------+ +---------+--------+--+--------+-+---------+ VertebralPSV cm/s34EDV cm/s9Antegrade +---------+--------+--+--------+-+---------+  Left Carotid Findings: +----------+-------+-------+--------+---------------------------------+--------+           PSV    EDV    StenosisPlaque Description               Comments           cm/s   cm/s                                                     +----------+-------+-------+--------+---------------------------------+--------+ CCA Prox  69     12             heterogenous                              +----------+-------+-------+--------+---------------------------------+--------+ CCA Distal51     7              heterogenous                              +----------+-------+-------+--------+---------------------------------+--------+ ICA Prox  342    125    80-99%  heterogenous, calcific and                                                irregular                                  +----------+-------+-------+--------+---------------------------------+--------+ ICA Mid   54     13                                                       +----------+-------+-------+--------+---------------------------------+--------+  ICA Distal22     9                                                        +----------+-------+-------+--------+---------------------------------+--------+ ECA       83                                                              +----------+-------+-------+--------+---------------------------------+--------+ +----------+--------+--------+--------+-------------------+           PSV cm/sEDV cm/sDescribeArm Pressure (mmHG) +----------+--------+--------+--------+-------------------+ Subclavian50                                          +----------+--------+--------+--------+-------------------+ +---------+--------+--+--------+--+---------+ VertebralPSV cm/s30EDV cm/s15Antegrade +---------+--------+--+--------+--+---------+   Summary: Right Carotid: Velocities in the right ICA are consistent with a 80-99%                stenosis. Left Carotid: Velocities in the left ICA are consistent with a 80-99% stenosis. Vertebrals: Bilateral vertebral arteries demonstrate antegrade flow. *See table(s) above for measurements and observations.  Electronically signed by Antony Contras MD on 09/24/2020 at 4:39:18 PM.    Final     Subjective: Very eager to go home today  Discharge Exam: Vitals:   09/29/20 1515 09/29/20 1559  BP: (!) 94/54 117/62  Pulse: 66 71  Resp: 20   Temp: 98.2 F (36.8 C)   SpO2: 100%    Vitals:   09/29/20 1031 09/29/20 1325 09/29/20 1515 09/29/20 1559  BP: (!) 118/57 (!) 96/52 (!) 94/54 117/62  Pulse: 61  66 71  Resp:   20   Temp:   98.2 F (36.8 C)   TempSrc:   Oral   SpO2:   100%   Weight:      Height:        General: Pt is alert, awake, not in acute distress Cardiovascular: RRR, S1/S2  + Respiratory: CTA bilaterally, no wheezing, no rhonchi Abdominal: Soft, NT, ND, bowel sounds + Extremities: no edema, no cyanosis   The results of significant diagnostics from this hospitalization (including imaging, microbiology, ancillary and laboratory) are listed below for reference.     Microbiology: Recent Results (from the past 240 hour(s))  Resp Panel by RT-PCR (Flu A&B, Covid) Nasopharyngeal Swab     Status: None   Collection Time: 09/23/20  8:12 PM   Specimen: Nasopharyngeal Swab; Nasopharyngeal(NP) swabs in vial transport medium  Result Value Ref Range Status   SARS Coronavirus 2 by RT PCR NEGATIVE NEGATIVE Final    Comment: (NOTE) SARS-CoV-2 target nucleic acids are NOT DETECTED.  The SARS-CoV-2 RNA is generally detectable in upper respiratory specimens during the acute phase of infection. The lowest concentration of SARS-CoV-2 viral copies this assay can detect is 138 copies/mL. A negative result does not preclude SARS-Cov-2 infection and should not be used as the sole basis for treatment or other patient management decisions. A negative result may occur with  improper specimen collection/handling, submission of specimen other than nasopharyngeal swab, presence of viral mutation(s) within the areas targeted by this assay,  and inadequate number of viral copies(<138 copies/mL). A negative result must be combined with clinical observations, patient history, and epidemiological information. The expected result is Negative.  Fact Sheet for Patients:  EntrepreneurPulse.com.au  Fact Sheet for Healthcare Providers:  IncredibleEmployment.be  This test is no t yet approved or cleared by the [place] FDA and  has been authorized for detection and/or diagnosis of SARS-CoV-2 by FDA under an Emergency Use Authorization (EUA). This EUA will remain  in effect (meaning this test can be used) for the duration of the COVID-19 declaration  under Section 564(b)(1) of the Act, 21 U.S.C.section 360bbb-3(b)(1), unless the authorization is terminated  or revoked sooner.       Influenza A by PCR NEGATIVE NEGATIVE Final   Influenza B by PCR NEGATIVE NEGATIVE Final    Comment: (NOTE) The Xpert Xpress SARS-CoV-2/FLU/RSV plus assay is intended as an aid in the diagnosis of influenza from Nasopharyngeal swab specimens and should not be used as a sole basis for treatment. Nasal washings and aspirates are unacceptable for Xpert Xpress SARS-CoV-2/FLU/RSV testing.  Fact Sheet for Patients: EntrepreneurPulse.com.au  Fact Sheet for Healthcare Providers: IncredibleEmployment.be  This test is not yet approved or cleared by the [place] FDA and has been authorized for detection and/or diagnosis of SARS-CoV-2 by FDA under an Emergency Use Authorization (EUA). This EUA will remain in effect (meaning this test can be used) for the duration of the COVID-19 declaration under Section 564(b)(1) of the Act, 21 U.S.C. section 360bbb-3(b)(1), unless the authorization is terminated or revoked.  Performed at [facility], [place] [address], [place], [place] [address]   Resp Panel by RT-PCR (Flu A&B, Covid) Nasopharyngeal Swab     Status: None   Collection Time: 09/27/20 10:31 PM   Specimen: Nasopharyngeal Swab; Nasopharyngeal(NP) swabs in vial transport medium  Result Value Ref Range Status   SARS Coronavirus 2 by RT PCR NEGATIVE NEGATIVE Final    Comment: (NOTE) SARS-CoV-2 target nucleic acids are NOT DETECTED.  The SARS-CoV-2 RNA is generally detectable in upper respiratory specimens during the acute phase of infection. The lowest concentration of SARS-CoV-2 viral copies this assay can detect is 138 copies/mL. A negative result does not preclude SARS-Cov-2 infection and should not be used as the sole basis for treatment or other patient management decisions. A negative result may occur with   improper specimen collection/handling, submission of specimen other than nasopharyngeal swab, presence of viral mutation(s) within the areas targeted by this assay, and inadequate number of viral copies(<138 copies/mL). A negative result must be combined with clinical observations, patient history, and epidemiological information. The expected result is Negative.  Fact Sheet for Patients:  EntrepreneurPulse.com.au  Fact Sheet for Healthcare Providers:  IncredibleEmployment.be  This test is no t yet approved or cleared by the [place] FDA and  has been authorized for detection and/or diagnosis of SARS-CoV-2 by FDA under an Emergency Use Authorization (EUA). This EUA will remain  in effect (meaning this test can be used) for the duration of the COVID-19 declaration under Section 564(b)(1) of the Act, 21 U.S.C.section 360bbb-3(b)(1), unless the authorization is terminated  or revoked sooner.       Influenza A by PCR NEGATIVE NEGATIVE Final   Influenza B by PCR NEGATIVE NEGATIVE Final    Comment: (NOTE) The Xpert Xpress SARS-CoV-2/FLU/RSV plus assay is intended as an aid in the diagnosis of influenza from Nasopharyngeal swab specimens and should not be used as a sole basis for treatment. Nasal washings and  aspirates are unacceptable for Xpert Xpress SARS-CoV-2/FLU/RSV testing.  Fact Sheet for Patients: EntrepreneurPulse.com.au  Fact Sheet for Healthcare Providers: IncredibleEmployment.be  This test is not yet approved or cleared by the [place] FDA and has been authorized for detection and/or diagnosis of SARS-CoV-2 by FDA under an Emergency Use Authorization (EUA). This EUA will remain in effect (meaning this test can be used) for the duration of the COVID-19 declaration under Section 564(b)(1) of the Act, 21 U.S.C. section 360bbb-3(b)(1), unless the authorization is terminated  or revoked.  Performed at [facility], [place] [address], [place], [place] [address]   Culture, Urine     Status: Abnormal (Preliminary result)   Collection Time: 09/28/20  1:17 AM   Specimen: Urine, Random  Result Value Ref Range Status   Specimen Description URINE, RANDOM  Final   Special Requests NONE  Final   Culture (A)  Final    >=100,000 COLONIES/mL KLEBSIELLA PNEUMONIAE SUSCEPTIBILITIES TO FOLLOW Performed at [facility], [place] [address], [place],  [address]    Report Status PENDING  Incomplete     Labs: BNP (last 3 results) Recent Labs    09/23/20 2229 09/27/20 2128  BNP 195.9* 0000000*   Basic Metabolic Panel: Recent Labs  Lab 09/24/20 0459 09/27/20 2042 09/27/20 2131 09/28/20 0030 09/28/20 0355 09/29/20 0348  NA 138 136 138 139 140 137  K 4.8 7.8* 4.6 3.9 4.2 4.3  CL 101 105 102 109 104 97*  CO2 26  --  27 21* 27 26  GLUCOSE 88 181* 177* 104* 104* 92  BUN 73* 129* 92* 76* 94* 95*  CREATININE 3.07* 4.10* 4.02* 3.13* 3.77* 3.86*  CALCIUM 8.3*  --  8.1* 7.2* 8.4* 8.7*  MG  --   --   --   --  4.0*  --   PHOS  --   --   --   --  4.6  --    Liver Function Tests: Recent Labs  Lab 09/24/20 0459 09/27/20 2131 09/28/20 0030 09/28/20 0355 09/29/20 0348  AST 17 54* 52* 47* 35  ALT 11 29 32 32 28  ALKPHOS 74 80 77 79 76  BILITOT 0.6 0.7 0.6 0.9 0.3  PROT 6.4* 6.2* 6.2* 6.4* 6.5  ALBUMIN 3.2* 3.1* 3.0* 3.1* 3.1*   No results for input(s): LIPASE, AMYLASE in the last 168 hours. No results for input(s): AMMONIA in the last 168 hours. CBC: Recent Labs  Lab 09/23/20 1540 09/23/20 1546 09/24/20 0459 09/27/20 2036 09/27/20 2042 09/28/20 0355 09/29/20 0348  WBC 7.3  --  10.1 7.9  --  8.7 8.2  NEUTROABS 5.0  --   --  5.3  --   --   --   HGB 9.9*   < > 10.1* 10.0* 10.9* 9.9* 9.6*  HCT 31.0*   < > 30.0* 31.3* 32.0* 30.0* 29.8*  MCV 95.7  --  92.0 95.7  --  93.5 94.0  PLT 155  --  148* 167  --  166 158   < > = values in this interval  not displayed.   Cardiac Enzymes: No results for input(s): CKTOTAL, CKMB, CKMBINDEX, TROPONINI in the last 168 hours. BNP: Invalid input(s): POCBNP CBG: Recent Labs  Lab 09/23/20 1539 09/27/20 2033  GLUCAP 139* 190*   D-Dimer No results for input(s): DDIMER in the last 72 hours. Hgb A1c No results for input(s): HGBA1C in the last 72 hours. Lipid Profile No results for input(s): CHOL, HDL, LDLCALC, TRIG,  CHOLHDL, LDLDIRECT in the last 72 hours. Thyroid function studies No results for input(s): TSH, T4TOTAL, T3FREE, THYROIDAB in the last 72 hours.  Invalid input(s): FREET3 Anemia work up No results for input(s): VITAMINB12, FOLATE, FERRITIN, TIBC, IRON, RETICCTPCT in the last 72 hours. Urinalysis    Component Value Date/Time   COLORURINE YELLOW 09/27/2020 0043   APPEARANCEUR CLOUDY (A) 09/27/2020 0043   LABSPEC 1.011 09/27/2020 0043   PHURINE 6.0 09/27/2020 0043   GLUCOSEU NEGATIVE 09/27/2020 0043   HGBUR SMALL (A) 09/27/2020 0043   BILIRUBINUR NEGATIVE 09/27/2020 0043   KETONESUR NEGATIVE 09/27/2020 0043   PROTEINUR NEGATIVE 09/27/2020 0043   UROBILINOGEN 0.2 09/13/2013 1633   NITRITE NEGATIVE 09/27/2020 0043   LEUKOCYTESUR LARGE (A) 09/27/2020 0043   Sepsis Labs Invalid input(s): PROCALCITONIN,  WBC,  LACTICIDVEN Microbiology Recent Results (from the past 240 hour(s))  Resp Panel by RT-PCR (Flu A&B, Covid) Nasopharyngeal Swab     Status: None   Collection Time: 09/23/20  8:12 PM   Specimen: Nasopharyngeal Swab; Nasopharyngeal(NP) swabs in vial transport medium  Result Value Ref Range Status   SARS Coronavirus 2 by RT PCR NEGATIVE NEGATIVE Final    Comment: (NOTE) SARS-CoV-2 target nucleic acids are NOT DETECTED.  The SARS-CoV-2 RNA is generally detectable in upper respiratory specimens during the acute phase of infection. The lowest concentration of SARS-CoV-2 viral copies this assay can detect is 138 copies/mL. A negative result does not preclude  SARS-Cov-2 infection and should not be used as the sole basis for treatment or other patient management decisions. A negative result may occur with  improper specimen collection/handling, submission of specimen other than nasopharyngeal swab, presence of viral mutation(s) within the areas targeted by this assay, and inadequate number of viral copies(<138 copies/mL). A negative result must be combined with clinical observations, patient history, and epidemiological information. The expected result is Negative.  Fact Sheet for Patients:  EntrepreneurPulse.com.au  Fact Sheet for Healthcare Providers:  IncredibleEmployment.be  This test is no t yet approved or cleared by the [place] FDA and  has been authorized for detection and/or diagnosis of SARS-CoV-2 by FDA under an Emergency Use Authorization (EUA). This EUA will remain  in effect (meaning this test can be used) for the duration of the COVID-19 declaration under Section 564(b)(1) of the Act, 21 U.S.C.section 360bbb-3(b)(1), unless the authorization is terminated  or revoked sooner.       Influenza A by PCR NEGATIVE NEGATIVE Final   Influenza B by PCR NEGATIVE NEGATIVE Final    Comment: (NOTE) The Xpert Xpress SARS-CoV-2/FLU/RSV plus assay is intended as an aid in the diagnosis of influenza from Nasopharyngeal swab specimens and should not be used as a sole basis for treatment. Nasal washings and aspirates are unacceptable for Xpert Xpress SARS-CoV-2/FLU/RSV testing.  Fact Sheet for Patients: EntrepreneurPulse.com.au  Fact Sheet for Healthcare Providers: IncredibleEmployment.be  This test is not yet approved or cleared by the [place] FDA and has been authorized for detection and/or diagnosis of SARS-CoV-2 by FDA under an Emergency Use Authorization (EUA). This EUA will remain in effect (meaning this test can be used) for the duration of  the COVID-19 declaration under Section 564(b)(1) of the Act, 21 U.S.C. section 360bbb-3(b)(1), unless the authorization is terminated or revoked.  Performed at [facility], [place] [address] [place], [place] [address]   Resp Panel by RT-PCR (Flu A&B, Covid) Nasopharyngeal Swab     Status: None   Collection Time: 09/27/20 10:31 PM   Specimen: Nasopharyngeal  Swab; Nasopharyngeal(NP) swabs in vial transport medium  Result Value Ref Range Status   SARS Coronavirus 2 by RT PCR NEGATIVE NEGATIVE Final    Comment: (NOTE) SARS-CoV-2 target nucleic acids are NOT DETECTED.  The SARS-CoV-2 RNA is generally detectable in upper respiratory specimens during the acute phase of infection. The lowest concentration of SARS-CoV-2 viral copies this assay can detect is 138 copies/mL. A negative result does not preclude SARS-Cov-2 infection and should not be used as the sole basis for treatment or other patient management decisions. A negative result may occur with  improper specimen collection/handling, submission of specimen other than nasopharyngeal swab, presence of viral mutation(s) within the areas targeted by this assay, and inadequate number of viral copies(<138 copies/mL). A negative result must be combined with clinical observations, patient history, and epidemiological information. The expected result is Negative.  Fact Sheet for Patients:  EntrepreneurPulse.com.au  Fact Sheet for Healthcare Providers:  IncredibleEmployment.be  This test is no t yet approved or cleared by the [place] FDA and  has been authorized for detection and/or diagnosis of SARS-CoV-2 by FDA under an Emergency Use Authorization (EUA). This EUA will remain  in effect (meaning this test can be used) for the duration of the COVID-19 declaration under Section 564(b)(1) of the Act, 21 U.S.C.section 360bbb-3(b)(1), unless the authorization is terminated  or revoked sooner.        Influenza A by PCR NEGATIVE NEGATIVE Final   Influenza B by PCR NEGATIVE NEGATIVE Final    Comment: (NOTE) The Xpert Xpress SARS-CoV-2/FLU/RSV plus assay is intended as an aid in the diagnosis of influenza from Nasopharyngeal swab specimens and should not be used as a sole basis for treatment. Nasal washings and aspirates are unacceptable for Xpert Xpress SARS-CoV-2/FLU/RSV testing.  Fact Sheet for Patients: EntrepreneurPulse.com.au  Fact Sheet for Healthcare Providers: IncredibleEmployment.be  This test is not yet approved or cleared by the [place] FDA and has been authorized for detection and/or diagnosis of SARS-CoV-2 by FDA under an Emergency Use Authorization (EUA). This EUA will remain in effect (meaning this test can be used) for the duration of the COVID-19 declaration under Section 564(b)(1) of the Act, 21 U.S.C. section 360bbb-3(b)(1), unless the authorization is terminated or revoked.  Performed at [facility], [place] [address], [place], [place] [address]   Culture, Urine     Status: Abnormal (Preliminary result)   Collection Time: 09/28/20  1:17 AM   Specimen: Urine, Random  Result Value Ref Range Status   Specimen Description URINE, RANDOM  Final   Special Requests NONE  Final   Culture (A)  Final    >=100,000 COLONIES/mL KLEBSIELLA PNEUMONIAE SUSCEPTIBILITIES TO FOLLOW Performed at [facility], [place] [address], [place], [place] [address]    Report Status PENDING  Incomplete   Time spent: 30 min  SIGNED:   Marylu Lund, MD  [facility] 09/29/2020, 4:49 PM  If 7PM-7AM, please contact night-coverage

## 2020-09-29 NOTE — TOC Progression Note (Signed)
Transition of Care [facility]) - Progression Note    Patient Details  Name: [name] MRN: [id number] Date of Birth: 02/04/1924  Transition of Care [facility]) CM/SW [place], RN Phone Number:[phone]  09/29/2020, 2:23 PM  Clinical Narrative:    Patient is active with [facility] for [facility]. Will resume services once discharged.        Expected Discharge Plan and Services                                                 Social Determinants of Health (SDOH) Interventions    Readmission Risk Interventions No flowsheet data found.

## 2020-09-29 NOTE — Progress Notes (Signed)
Physical Therapy Treatment Patient Details Name: [name] MRN: [id number] DOB: 1923-12-16 Today's Date: 09/29/2020    History of Present Illness 87 y.o. female presented as CODE STROKE to ED on 6/18 with new onset R-sided weakness. Further work-up pending. UA (+) with suspicion for UTI. Recent admission 6/14 for evaluation of sudden onset of dysarthria. MRI (+) small cortical/subcortical acute PCA infarcts and possible acute punctate infarct. PMHx significant for CHF, CKD, HTN, dilated cardiomyopathy, cervical spondylosis, breast mass, chronic back pain    PT Comments    Patient reports significant pain earlier today (and yesterday) when in bed with legs extended in front of her. Much less pain when legs/feet dependent. Patient motivated and walked 50 ft with RW and minguard assist and completed bil LE exercises.     Follow Up Recommendations  Home health PT;Supervision/Assistance - 24 hour     Equipment Recommendations  None recommended by PT    Recommendations for Other Services       Precautions / Restrictions Precautions Precautions: Fall Precaution Comments: chronic back pain, LE spasms    Mobility  Bed Mobility                    Transfers Overall transfer level: Needs assistance Equipment used: Rolling walker (2 wheeled) Transfers: Sit to/from Stand Sit to Stand: Min guard         General transfer comment: from recliner x 5 reps total  Ambulation/Gait Ambulation/Gait assistance: Min guard Gait Distance (Feet): 50 Feet Assistive device: Rolling walker (2 wheeled) Gait Pattern/deviations: Step-through pattern;Decreased stride length Gait velocity: decreased   General Gait Details: slow and steady, flexed posture, minguard for safety but no imbalance   Stairs             Wheelchair Mobility    Modified Rankin (Stroke Patients Only) Modified Rankin (Stroke Patients Only) Pre-Morbid Rankin Score: Slight disability Modified Rankin:  Moderate disability     Balance Overall balance assessment: Needs assistance Sitting-balance support: No upper extremity supported;Feet supported Sitting balance-Leahy Scale: Good     Standing balance support: No upper extremity supported Standing balance-Leahy Scale: Fair Standing balance comment: Able to maintain static standing without UE assist                            Cognition Arousal/Alertness: Awake/alert Behavior During Therapy: WFL for tasks assessed/performed Overall Cognitive Status: Within Functional Limits for tasks assessed                                 General Comments: Cognitiion appears to be at baseline.      Exercises General Exercises - Lower Extremity Ankle Circles/Pumps: AROM;Both;10 reps Long Arc Quad: AROM;Both;10 reps Hip Flexion/Marching: AROM;Both;5 reps (increased her back pain) Heel Raises: AROM;Both;10 reps Other Exercises Other Exercises: sit to stand with bil UE support x 3 consecutive reps and could not do again due to fatigue    General Comments General comments (skin integrity, edema, etc.): son present      Pertinent Vitals/Pain Pain Assessment: Faces Faces Pain Scale: Hurts little more Pain Location: back and bil LE pain Pain Descriptors / Indicators: Grimacing;Aching Pain Intervention(s): Limited activity within patient's tolerance;Monitored during session;Repositioned    Home Living                      Prior Function  PT Goals (current goals can now be found in the care plan section) Acute Rehab PT Goals Patient Stated Goal: To return home. Time For Goal Achievement: 10/05/20 Potential to Achieve Goals: Fair Progress towards PT goals: Progressing toward goals    Frequency    Min 4X/week      PT Plan Current plan remains appropriate    Co-evaluation              AM-PAC PT "6 Clicks" Mobility   Outcome Measure  Help needed turning from your back to your  side while in a flat bed without using bedrails?: A Little Help needed moving from lying on your back to sitting on the side of a flat bed without using bedrails?: A Little Help needed moving to and from a bed to a chair (including a wheelchair)?: A Little Help needed standing up from a chair using your arms (e.g., wheelchair or bedside chair)?: A Little Help needed to walk in hospital room?: A Little Help needed climbing 3-5 steps with a railing? : A Little 6 Click Score: 18    End of Session Equipment Utilized During Treatment: Gait belt Activity Tolerance: Patient limited by fatigue Patient left: in chair;with chair alarm set;with family/visitor present Nurse Communication: Mobility status PT Visit Diagnosis: Unsteadiness on feet (R26.81);Difficulty in walking, not elsewhere classified (R26.2)     Time: 1423-1450 PT Time Calculation (min) (ACUTE ONLY): 27 min  Charges:  $Gait Training: 8-22 mins $Therapeutic Exercise: 8-22 mins                      Arby Barrette, PT Pager [phone]    Rexanne Mano 09/29/2020, 2:57 PM

## 2020-09-30 LAB — POCT I-STAT, CHEM 8
BUN: 129 mg/dL — ABNORMAL HIGH (ref 8–23)
Calcium, Ion: 0.97 mmol/L — ABNORMAL LOW (ref 1.15–1.40)
Chloride: 105 mmol/L (ref 98–111)
Creatinine, Ser: 4.1 mg/dL — ABNORMAL HIGH (ref 0.44–1.00)
Glucose, Bld: 181 mg/dL — ABNORMAL HIGH (ref 70–99)
HCT: 32 % — ABNORMAL LOW (ref 36.0–46.0)
Hemoglobin: 10.9 g/dL — ABNORMAL LOW (ref 12.0–15.0)
Potassium: 7.8 mmol/L (ref 3.5–5.1)
Sodium: 136 mmol/L (ref 135–145)
TCO2: 30 mmol/L (ref 22–32)

## 2020-09-30 LAB — URINE CULTURE: Culture: >=100000 — AB

## 2020-11-11 ENCOUNTER — Ambulatory Visit: Payer: Medicare PPO | Admitting: Adult Health

## 2020-11-11 ENCOUNTER — Encounter: Payer: Self-pay | Admitting: Adult Health

## 2020-11-11 VITALS — BP 106/64 | HR 64 | Wt 151.0 lb

## 2020-11-11 DIAGNOSIS — E785 Hyperlipidemia, unspecified: Secondary | ICD-10-CM

## 2020-11-11 DIAGNOSIS — I63232 Cerebral infarction due to unspecified occlusion or stenosis of left carotid arteries: Secondary | ICD-10-CM | POA: Diagnosis not present

## 2020-11-11 DIAGNOSIS — I10 Essential (primary) hypertension: Secondary | ICD-10-CM | POA: Diagnosis not present

## 2020-11-11 DIAGNOSIS — I6522 Occlusion and stenosis of left carotid artery: Secondary | ICD-10-CM | POA: Diagnosis not present

## 2020-11-11 NOTE — Progress Notes (Signed)
[facility] [address] [place]. [place] [address] [phone]       HOSPITAL FOLLOW UP NOTE  Ms. [name] Date of Birth:  1923/09/07 Medical Record Number:  [id number]   Reason for Referral:  hospital stroke follow up    SUBJECTIVE:   CHIEF COMPLAINT:  Chief Complaint  Patient presents with   Follow-up    Rm 3 with daugther here for f/u on stroke work completed on 09/23/2020. Reports overall she has been doing well. Some pain/weakness in her legs     HPI:   [name] is a 87 year old female with history of CHF, CKD, hypertension, cardiomyopathy, breast mass admitted on 09/23/2020 for slurred speech.  Personally reviewed hospitalization pertinent progress notes, lab work and imaging with summary provided.  CT no acute finding.  Offered tPA but patient declined.  MRI showed several punctate/small infarcts at left MCA and MCA/PCA territory.  MRA head showed left P2 and left terminal ICA stenosis.  Carotid Doppler showed bilateral ICA bulb high-grade 80 to 99% stenosis. EF 30 to 35%. In 05/2016 her EF 20 to 25%. LDL 81, A1c 5.4, creatinine 3.07.  Etiology likely due to left ICA high-grade stenosis although cardiomyopathy with low EF possible contributing factor.  Patient and family declined intervention for symptomatic carotid stenosis and preferred medical management.  Recommended BP goal 140-160 to ensure adequate perfusion and to follow-up with cardiology for cardiomyopathy.  Placed on aspirin Plavix for 3 months then Plavix alone as well as atorvastatin 40 mg daily.  PT/OT recommended home health PT/OT and discharged home in stable condition on 09/25/2020.  She returned on 09/27/2020 after episode of unresponsiveness and right-sided weakness- BP 80/40 upon EMS arrival.  Concern for seizure type activity although EEG negative.  She was found to have UTI and likely contributing factor as well as hypotensive.  BP medications adjusted and discharged home on  09/29/2020.  Today, 11/11/2020, Ms. [name] is being seen for hospital accompanied by her daughter. She has been doing well since discharge.  Reports improvement of speech and cognition.  Complains of bilateral leg weakness/pain which she was experiencing prior to her stroke with some worsening stroke-was working with [facility] therapies but is going to [facility] for rehab as she has had difficulty with ambulation and daily functioning due to leg pain.  Denies new stroke/TIA symptoms.  Remains on aspirin and Plavix without associated side effects.  Blood pressure today 106/64. Blood pressure at home routinely monitored typically SBP 110-120s. Has been to nephrologist who manages blood pressure - has remained off from Imdur and hydralazine. Has not yet seen cardiology.  No further concerns at this time.    ROS:   14 system review of systems performed and negative with exception of those listed in HPI  PMH:  Past Medical History:  Diagnosis Date   Breast mass    Cervical spondylosis    Dilated cardiomyopathy ([place]) 05/2016   Mild LVH with EF of 20 1125%. Hypokinesis with inferior akinesis. GR 1 DD noted with elevated LVEDP. Moderate LA dilation. Moderate mitral regurgitation. Aortic valve aortic valve calcification/sclerosis without stenosis.   Hypertension     PSH:  Past Surgical History:  Procedure Laterality Date   APPENDECTOMY     BACK SURGERY     x 4   CATARACT EXTRACTION     KNEE SURGERY     x 2   TRANSTHORACIC ECHOCARDIOGRAM  05/2016   Mild LVH with EF of 20 1125%. Hypokinesis  with inferior akinesis. GR 1 DD noted with elevated LVEDP. Moderate LA dilation. Moderate mitral regurgitation. Aortic valve aortic valve calcification/sclerosis without stenosis.   VENTRAL HERNIA REPAIR N/A 09/14/2013   Procedure: LAPAROSCOPIC ventral HERNIA REPAIR with mesh ;  Surgeon: Shann Medal, MD;  Location: [facility];  Service: General;  Laterality: N/A;    Social History:  Social History    Socioeconomic History   Marital status: Widowed    Spouse name: Not on file   Number of children: Not on file   Years of education: Not on file   Highest education level: Not on file  Occupational History   Not on file  Tobacco Use   Smoking status: Former    Types: Cigarettes   Smokeless tobacco: Never  Substance and Sexual Activity   Alcohol use: No   Drug use: No   Sexual activity: Not on file  Other Topics Concern   Not on file  Social History Narrative   Not on file   Social Determinants of Health   Financial Resource Strain: Not on file  Food Insecurity: Not on file  Transportation Needs: Not on file  Physical Activity: Not on file  Stress: Not on file  Social Connections: Not on file  Intimate Partner Violence: Not on file    Family History:  Family History  Problem Relation Age of Onset   Diabetes Mother    Hypertension Mother    Cancer Father        uknown kind of cancer   Diabetes Sister     Medications:   Current Outpatient Medications on File Prior to Visit  Medication Sig Dispense Refill   acetaminophen (TYLENOL) 325 MG tablet Take 650 mg by mouth every 6 (six) hours as needed for mild pain.     allopurinol (ZYLOPRIM) 100 MG tablet Take 100 mg by mouth daily.      amoxicillin (AMOXIL) 500 MG capsule Take 500 mg by mouth 3 (three) times daily.     aspirin EC 81 MG tablet Take 1 tablet (81 mg total) by mouth every morning. 90 tablet 0   atorvastatin (LIPITOR) 40 MG tablet Take 1 tablet (40 mg total) by mouth daily. 30 tablet 0   calcitRIOL (ROCALTROL) 0.5 MCG capsule Take 0.5 mcg by mouth daily.     carvedilol (COREG) 6.25 MG tablet Take 1 tablet (6.25 mg total) by mouth 2 (two) times daily with a meal. Please schedule an appointment for further refills 60 tablet 0   clopidogrel (PLAVIX) 75 MG tablet Take 1 tablet (75 mg total) by mouth daily. 30 tablet 0   felodipine (PLENDIL) 10 MG 24 hr tablet Take 10 mg by mouth daily.     furosemide (LASIX) 40  MG tablet Take 0.5-1 tablets (20-40 mg total) by mouth 2 (two) times daily. Take 40 mg in the morning and 20 mg in afternoon, may take extra 20 mg dose in the afternoon as needed     gabapentin (NEURONTIN) 300 MG capsule Take 300 mg by mouth at bedtime.     Multiple Vitamins-Minerals (PRESERVISION AREDS PO) Take 1 capsule by mouth daily.      potassium chloride SA (KLOR-CON) 20 MEQ tablet Take 20 mEq by mouth 2 (two) times daily.     No current facility-administered medications on file prior to visit.    Allergies:  No Known Allergies    OBJECTIVE:  Physical Exam  Vitals:   11/11/20 1518  BP: 106/64  Pulse: 64  SpO2:  96%  Weight: 151 lb (68.5 kg)   Body mass index is 27.62 kg/m. No results found.  Post stroke PHQ 2/9 Depression screen PHQ 2/9 11/11/2020  Decreased Interest 0  Down, Depressed, Hopeless 0  PHQ - 2 Score 0     General: well developed, well nourished, very pleasant elderly African-American female, seated, in no evident distress Head: head normocephalic and atraumatic.   Neck: supple with no carotid or supraclavicular bruits Cardiovascular: regular rate and rhythm, no murmurs Musculoskeletal: no deformity Skin:  no rash/petichiae Vascular:  Normal pulses all extremities   Neurologic Exam Mental Status: Awake and fully alert.  Fluent speech and language.  Oriented to place and time. Recent and remote memory intact. Attention span, concentration and fund of knowledge appropriate. Mood and affect appropriate.  Cranial Nerves: Fundoscopic exam reveals sharp disc margins. Pupils equal, briskly reactive to light. Extraocular movements full without nystagmus. Visual fields full to confrontation. Hearing intact. Facial sensation intact. Face, tongue, palate moves normally and symmetrically.  Motor: Normal bulk and tone. Normal strength in all tested extremity muscles except bilateral hip flexor weakness Sensory.: intact to touch , pinprick , position and vibratory  sensation.  Coordination: Rapid alternating movements normal in all extremities. Finger-to-nose and heel-to-shin performed accurately bilaterally. Gait and Station: Deferred Reflexes: 1+ and symmetric. Toes downgoing.     NIHSS  0 Modified Rankin  0      ASSESSMENT: [name] is a 87 y.o. year old female with recent left MCA and MCA/PCA territory strokes likely due to left ICA bulb high-grade stenosis on 09/23/2020 although cardiomyopathy with low EF possible contributing factor. Vascular risk factors include CHF, CKD, HTN, HLD, cardiomyopathy and left carotid stenosis.      PLAN:  L MCA and MCA/PCA stroke :  Recovered well without residual deficit.   Continue aspirin 81 mg daily and clopidogrel 75 mg daily  and atorvastatin 40 mg daily for secondary stroke prevention.  Advised to continue aspirin for additional 6 weeks then stop. Discussed secondary stroke prevention measures and importance of close PCP follow up for aggressive stroke risk factor management. I have gone over the pathophysiology of stroke, warning signs and symptoms, risk factors and their management in some detail with instructions to go to the closest emergency room for symptoms of concern. Left carotid stenosis: Patient and family denied intervention and wishes to continue with medical management.  Completed 3 months DAPT and Plavix alone and ongoing use of statin.  Also discussed importance of maintaining blood pressure goal to ensure adequate perfusion.  HTN: BP goal between 130-160 to ensure adequate perfusion.  On low side today despite recent medication changes.  Recommend follow-up with nephrology for further discussion on blood pressure management currently on carvedilol and furosemide HLD: LDL goal <70. Recent LDL 81.  Continue atorvastatin 40 mg daily Cardiomyopathy: Previously followed by Dr. Ellyn Hack but has not been seen since 2018.  Recommend reestablishing care or if new referral is needed, to let me  know or reach out to PCP    Follow up in 3 months or call earlier if needed   CC:  [place] provider: Dr. Leonie Man PCP: Jilda Panda, MD    I spent 54 minutes of face-to-face and non-face-to-face time with patient and daughter.  This included previsit chart review including review of recent hospitalization, lab review, study review, order entry, electronic health record documentation, patient and daughter education regarding recent stroke and etiology, secondary stroke prevention measures and aggressive stroke risk factor management  and answered all questions to patient satisfaction  Frann Rider, [facility]  [facility] [address] [place], [place] [address]  Phone [phone] Fax [phone] Note: This document was prepared with digital dictation and possible smart phrase technology. Any transcriptional errors that result from this process are unintentional.

## 2020-11-11 NOTE — Patient Instructions (Addendum)
Continue aspirin 81 mg daily and clopidogrel 75 mg daily  and atorvastatin '40mg'$  daily  for secondary stroke prevention  Continue aspirin for total of 3 months then stop and continue on plavix alone (mid September)   Continue to follow up with PCP regarding cholesterol and blood pressure management  Maintain strict control of hypertension with blood pressure goal below 130/90 and cholesterol with LDL cholesterol (bad cholesterol) goal below 70 mg/dL.   Recommend establishing care with cardiologist once able     Followup in the future with me in 3 months or call earlier if needed      Thank you for coming to see [place] at [facility]. I hope we have been able to provide you high quality care today.  You may receive a patient satisfaction survey over the next few weeks. We would appreciate your feedback and comments so that we may continue to improve ourselves and the health of our patients.

## 2020-11-13 ENCOUNTER — Encounter: Payer: Self-pay | Admitting: Adult Health

## 2020-11-13 DIAGNOSIS — I63232 Cerebral infarction due to unspecified occlusion or stenosis of left carotid arteries: Secondary | ICD-10-CM

## 2020-11-13 NOTE — Progress Notes (Signed)
I agree with the above plan 

## 2020-11-17 NOTE — Telephone Encounter (Signed)
Yes - that would be fine.  Thank you.

## 2021-02-24 ENCOUNTER — Ambulatory Visit: Payer: Medicare PPO | Admitting: Adult Health

## 2021-02-24 ENCOUNTER — Encounter: Payer: Self-pay | Admitting: Adult Health

## 2021-02-24 VITALS — BP 126/86 | HR 76 | Ht 60.0 in | Wt 151.0 lb

## 2021-02-24 DIAGNOSIS — E785 Hyperlipidemia, unspecified: Secondary | ICD-10-CM | POA: Diagnosis not present

## 2021-02-24 DIAGNOSIS — I63232 Cerebral infarction due to unspecified occlusion or stenosis of left carotid arteries: Secondary | ICD-10-CM | POA: Diagnosis not present

## 2021-02-24 DIAGNOSIS — I6523 Occlusion and stenosis of bilateral carotid arteries: Secondary | ICD-10-CM | POA: Diagnosis not present

## 2021-02-24 DIAGNOSIS — I10 Essential (primary) hypertension: Secondary | ICD-10-CM | POA: Diagnosis not present

## 2021-02-24 NOTE — Patient Instructions (Signed)
Continue clopidogrel 75 mg daily  and atorvastatin  for secondary stroke prevention  Follow up with cardiology as scheduled - they can also follow up on carotid narrowing   Continue to follow up with PCP regarding cholesterol and blood pressure management  Maintain strict control of hypertension with blood pressure goal below 130/90 and cholesterol with LDL cholesterol (bad cholesterol) goal below 70 mg/dL.       Followup in the future with me in 6 months or call earlier if needed       Thank you for coming to see [place] at [facility]. I hope we have been able to provide you high quality care today.  You may receive a patient satisfaction survey over the next few weeks. We would appreciate your feedback and comments so that we may continue to improve ourselves and the health of our patients.

## 2021-02-24 NOTE — Progress Notes (Signed)
[facility] [address] [place]. [place] [address] [phone]       STROKE FOLLOW UP NOTE  [name] Date of Birth:  03/04/1924 Medical Record Number:  [id number]   Reason for visit: Stroke follow up    SUBJECTIVE:   CHIEF COMPLAINT:  Chief Complaint  Patient presents with   Cerebrovascular Accident    Rm 2, 3 month FU, dgtr- [name] "completed 2 months of PT/OT which helped"      HPI:   Update 02/24/2021 JM: Returns for 61-month stroke follow-up accompanied by her daughter.  Overall stable since last visit -denies new stroke/TIA symptoms Returned back on home 9/27 from [place] - currently living in own home but does have 2 caregivers from [place] as well as very supportive family who assists as needed. Completed therapies with improvement of b/l leg weakness and pain (chronic - slightly worsened post stroke).  Ambulating with rolling walker -denies any recent falls  Completed 3 months DAPT -remains on Plavix -denies side effects Remains on atorvastatin 40 mg daily -denies side effects Blood pressure today 126/86 - routinely monitors at home which has been stable   Has had recent visit with PCP - unable to view via epic Has appt 12/7 with Dr. Harl Bowie cardiology   No further concerns at this time     History provided for reference purposes only Initial visit 11/11/2020 JM: [name] is being seen for hospital accompanied by her daughter. She has been doing well since discharge.  Reports improvement of speech and cognition.  Complains of bilateral leg weakness/pain which she was experiencing prior to her stroke with some worsening stroke-was working with [facility] therapies but is going to [facility] for rehab as she has had difficulty with ambulation and daily functioning due to leg pain.  Denies new stroke/TIA symptoms.  Remains on aspirin and Plavix without associated side effects.  Blood pressure today 106/64. Blood pressure  at home routinely monitored typically SBP 110-120s. Has been to nephrologist who manages blood pressure - has remained off from Imdur and hydralazine. Has not yet seen cardiology.  No further concerns at this time.  Stroke admission 09/23/2020 [name] is a 87 year old female with history of CHF, CKD, hypertension, cardiomyopathy, breast mass admitted on 09/23/2020 for slurred speech.  Personally reviewed hospitalization pertinent progress notes, lab work and imaging with summary provided.  CT no acute finding.  Offered tPA but patient declined.  MRI showed several punctate/small infarcts at left MCA and MCA/PCA territory.  MRA head showed left P2 and left terminal ICA stenosis.  Carotid Doppler showed bilateral ICA bulb high-grade 80 to 99% stenosis. EF 30 to 35%. In 05/2016 her EF 20 to 25%. LDL 81, A1c 5.4, creatinine 3.07.  Etiology likely due to left ICA high-grade stenosis although cardiomyopathy with low EF possible contributing factor.  Patient and family declined intervention for symptomatic carotid stenosis and preferred medical management.  Recommended BP goal 140-160 to ensure adequate perfusion and to follow-up with cardiology for cardiomyopathy.  Placed on aspirin Plavix for 3 months then Plavix alone as well as atorvastatin 40 mg daily.  PT/OT recommended home health PT/OT and discharged home in stable condition on 09/25/2020.  She returned on 09/27/2020 after episode of unresponsiveness and right-sided weakness- BP 80/40 upon EMS arrival.  Concern for seizure type activity although EEG negative.  She was found to have UTI and likely contributing factor as well as hypotensive.  BP medications adjusted and discharged  home on 09/29/2020.     ROS:   14 system review of systems performed and negative with exception of those listed in HPI  PMH:  Past Medical History:  Diagnosis Date   Breast mass    Cervical spondylosis    Dilated cardiomyopathy ([place]) 05/2016   Mild LVH with EF of 20  1125%. Hypokinesis with inferior akinesis. GR 1 DD noted with elevated LVEDP. Moderate LA dilation. Moderate mitral regurgitation. Aortic valve aortic valve calcification/sclerosis without stenosis.   Hypertension     PSH:  Past Surgical History:  Procedure Laterality Date   APPENDECTOMY     BACK SURGERY     x 4   CATARACT EXTRACTION     KNEE SURGERY     x 2   TRANSTHORACIC ECHOCARDIOGRAM  05/2016   Mild LVH with EF of 20 1125%. Hypokinesis with inferior akinesis. GR 1 DD noted with elevated LVEDP. Moderate LA dilation. Moderate mitral regurgitation. Aortic valve aortic valve calcification/sclerosis without stenosis.   VENTRAL HERNIA REPAIR N/A 09/14/2013   Procedure: LAPAROSCOPIC ventral HERNIA REPAIR with mesh ;  Surgeon: Shann Medal, MD;  Location: [facility];  Service: General;  Laterality: N/A;    Social History:  Social History   Socioeconomic History   Marital status: Widowed    Spouse name: Not on file   Number of children: 2   Years of education: Not on file   Highest education level: Not on file  Occupational History   Not on file  Tobacco Use   Smoking status: Former    Types: Cigarettes   Smokeless tobacco: Never  Substance and Sexual Activity   Alcohol use: No   Drug use: No   Sexual activity: Not on file  Other Topics Concern   Not on file  Social History Narrative   02/24/21 lives alone, dgtr 5 min away, cameras in house, caregivers   Social Determinants of Health   Financial Resource Strain: Not on file  Food Insecurity: Not on file  Transportation Needs: Not on file  Physical Activity: Not on file  Stress: Not on file  Social Connections: Not on file  Intimate Partner Violence: Not on file    Family History:  Family History  Problem Relation Age of Onset   Diabetes Mother    Hypertension Mother    Cancer Father        uknown kind of cancer   Diabetes Sister     Medications:   Current Outpatient Medications on File Prior to Visit   Medication Sig Dispense Refill   acetaminophen (TYLENOL) 325 MG tablet Take 650 mg by mouth every 6 (six) hours as needed for mild pain.     allopurinol (ZYLOPRIM) 100 MG tablet Take 100 mg by mouth daily.      atorvastatin (LIPITOR) 40 MG tablet Take 1 tablet (40 mg total) by mouth daily. 30 tablet 0   calcitRIOL (ROCALTROL) 0.5 MCG capsule Take 0.5 mcg by mouth daily.     carvedilol (COREG) 6.25 MG tablet Take 1 tablet (6.25 mg total) by mouth 2 (two) times daily with a meal. Please schedule an appointment for further refills 60 tablet 0   clopidogrel (PLAVIX) 75 MG tablet Take 1 tablet (75 mg total) by mouth daily. 30 tablet 0   furosemide (LASIX) 40 MG tablet Take 0.5-1 tablets (20-40 mg total) by mouth 2 (two) times daily. Take 40 mg in the morning and 20 mg in afternoon, may take extra 20 mg dose in the  afternoon as needed     gabapentin (NEURONTIN) 300 MG capsule Take 300 mg by mouth at bedtime.     Multiple Vitamins-Minerals (PRESERVISION AREDS PO) Take 1 capsule by mouth daily.      potassium chloride SA (KLOR-CON) 20 MEQ tablet Take 20 mEq by mouth 2 (two) times daily.     felodipine (PLENDIL) 10 MG 24 hr tablet Take 10 mg by mouth daily. (Patient not taking: Reported on 02/24/2021)     No current facility-administered medications on file prior to visit.    Allergies:  No Known Allergies    OBJECTIVE:  Physical Exam  Vitals:   02/24/21 1307  BP: 126/86  Pulse: 76  Weight: 151 lb (68.5 kg)  Height: 5' (1.524 m)    Body mass index is 29.49 kg/m.  General: well developed, well nourished, very pleasant elderly African-American female, seated, in no evident distress Head: head normocephalic and atraumatic.   Neck: supple with no carotid or supraclavicular bruits Cardiovascular: regular rate and rhythm, no murmurs Musculoskeletal: no deformity Skin:  no rash/petichiae Vascular:  Normal pulses all extremities   Neurologic Exam Mental Status: Awake and fully alert.   Fluent speech and language.  Oriented to place and time. Recent and remote memory intact. Attention span, concentration and fund of knowledge appropriate. Mood and affect appropriate.  Cranial Nerves: Pupils equal, briskly reactive to light. Extraocular movements full without nystagmus. Visual fields full to confrontation. Hearing intact. Facial sensation intact. Face, tongue, palate moves normally and symmetrically.  Motor: Normal bulk and tone. Normal strength in all tested extremity muscles  Sensory.: intact to touch , pinprick , position and vibratory sensation.  Coordination: Rapid alternating movements normal in all extremities. Finger-to-nose and heel-to-shin performed accurately bilaterally. Gait and Station: stands from seated position with only mild difficulty. Stance is hunched.  Gait demonstrates slow gait with decreased stride length and step height with rolling walker.  Tandem walking heel toe not attempted Reflexes: 1+ and symmetric. Toes downgoing.        ASSESSMENT: [name] is a 87 y.o. year old female with recent left MCA and MCA/PCA territory strokes likely due to left ICA bulb high-grade stenosis on 09/23/2020 although cardiomyopathy with low EF possible contributing factor. Vascular risk factors include CHF, CKD, HTN, HLD, cardiomyopathy and left carotid stenosis.      PLAN:  L MCA and MCA/PCA stroke :  Recovered well without residual deficit  Continue clopidogrel 75 mg daily  and atorvastatin 40 mg daily for secondary stroke prevention.  Advised to continue aspirin for additional 6 weeks then stop. Discussed secondary stroke prevention measures and importance of close PCP follow up for aggressive stroke risk factor management. I have gone over the pathophysiology of stroke, warning signs and symptoms, risk factors and their management in some detail with instructions to go to the closest emergency room for symptoms of concern. Carotid stenosis, bilateral: Carotid  duplex 09/24/2020 b/l ICA 80 to 99% stenosis.  Patient and family denied intervention and wishes to continue with medical management.  Continue Plavix and statin.  Also discussed importance of maintaining blood pressure goal to ensure adequate perfusion. Has f/u with cardiology 12/7 - defer need of ongoing monitoring to their office HTN: BP goal between 130-160 to ensure adequate perfusion and avoidance of hypotension.  Recommend follow-up with nephrology/cardiology for continued monitoring/management HLD: LDL goal <70. Continue atorvastatin 40 mg daily.  Monitored by PCP -unable to view via epic Cardiomyopathy: has appt scheduled 12/7 with Dr. Harl Bowie  Follow up in 6 months or call earlier if needed - if remains stable, can f/u as needed   CC:  PCP: Jilda Panda, MD    I spent 31 minutes of face-to-face and non-face-to-face time with patient and daughter.  This included previsit chart review, lab review, study review, electronic health record documentation, patient and daughter education regarding prior stroke and etiology, secondary stroke prevention measures and aggressive stroke risk factor management and answered all questions to patient satisfaction  Frann Rider, [facility]  [facility] [address] [place], [place] [address]  Phone [phone] Fax [phone] Note: This document was prepared with digital dictation and possible smart phrase technology. Any transcriptional errors that result from this process are unintentional.

## 2021-03-17 NOTE — Progress Notes (Addendum)
Cardiology Office Note:    Date:  03/17/2021   ID:  [name], DOB 07/27/1923, MRN [id number]  PCP:  Jilda Panda, MD   [place] Providers Cardiologist:  None     Referring MD: Jilda Panda, MD   No chief complaint on file. Systolic HF  History of Present Illness:    [name] is a 87 y.o. female with a hx of HTN, CKD,  former patient of Dr. Claiborne Billings for systolic HF, stroke with BL carotid artery stenosis high grade 80-99% started on DAPT for 3 months then plavix, referral to re-establish care  She has a mild stroke 09/23/2020. She had slurred speech and presented to the hospital. CT showed no acute changes. MRI showed several punctate/small infarcts at left MCA and MCA/PCA territory. Carotid stenosis was noted above. Patient and family declined endarterectomy with shared decision making. She was started on DAPT, then plavix. She had some right sided weakness. She had an echo showing EF 30-35% global hypokinesis improved from 2018. No LV thrombus  She worked with physical therapy afterwards. She went to an independent facility at [facility] on [address]  She's done well. At home, she ambulates around the house and active with Bible study.  She sees a nephrologist for CKD stage V. She follows with Dr. Hollie Salk. They are watching it. Unlikely IHD candidate with her age. Crt 3.3/ BUN 79  12/03/2020  She was last seen 07/27/2016. She was diagnosed with systolic CHF in March of that year with EF 20-25%. She presented to the hospital in March 2018 and found to have systolic CHF. She was not deemed to be candidate of ischemic w/u. At that time she was on coreg 6.25 mg BID, lasix 40 in the AM, 20 in the afternoon and extra 20 with weight gain. As well as metolazone 2.5 mg twice a week (Mon and Thursday). Hydralazine 25 mg BID, imdur 30 mg daily.   Her blood pressures checked at home. She has home health and they check the blood pressures.   Social Hx: she lives at home with her  daughter.    09/27/2020 BNP 111 Crt 4.02; BUN 92, GFR  TTE 09/24/2020 EF-30-35% Significant MAC on the mitral valve, moderate MR Aortic valve calcification- mild AI. No AS valve area 1.65 cm2  Carotid Doppler showed bilateral ICA bulb high-grade 80 to 99% stenosis  06/09/2016 EF 20-25%, inferior akinesis, moderate MR/MAC. RVSP 31 mmHg. Dilate LV.   Past Medical History:  Diagnosis Date   Breast mass    Cervical spondylosis    Dilated cardiomyopathy ([place]) 05/2016   Mild LVH with EF of 20 1125%. Hypokinesis with inferior akinesis. GR 1 DD noted with elevated LVEDP. Moderate LA dilation. Moderate mitral regurgitation. Aortic valve aortic valve calcification/sclerosis without stenosis.   Hypertension     Past Surgical History:  Procedure Laterality Date   APPENDECTOMY     BACK SURGERY     x 4   CATARACT EXTRACTION     KNEE SURGERY     x 2   TRANSTHORACIC ECHOCARDIOGRAM  05/2016   Mild LVH with EF of 20 1125%. Hypokinesis with inferior akinesis. GR 1 DD noted with elevated LVEDP. Moderate LA dilation. Moderate mitral regurgitation. Aortic valve aortic valve calcification/sclerosis without stenosis.   VENTRAL HERNIA REPAIR N/A 09/14/2013   Procedure: LAPAROSCOPIC ventral HERNIA REPAIR with mesh ;  Surgeon: Shann Medal, MD;  Location: [facility];  Service: General;  Laterality: N/A;    Current Medications: No  outpatient medications have been marked as taking for the 03/18/21 encounter (Appointment) with Janina Mayo, MD.     Allergies:   Patient has no known allergies.   Social History   Socioeconomic History   Marital status: Widowed    Spouse name: Not on file   Number of children: 2   Years of education: Not on file   Highest education level: Not on file  Occupational History   Not on file  Tobacco Use   Smoking status: Former    Types: Cigarettes   Smokeless tobacco: Never  Substance and Sexual Activity   Alcohol use: No   Drug use: No   Sexual activity: Not on  file  Other Topics Concern   Not on file  Social History Narrative   02/24/21 lives alone, dgtr 5 min away, cameras in house, caregivers   Social Determinants of Health   Financial Resource Strain: Not on file  Food Insecurity: Not on file  Transportation Needs: Not on file  Physical Activity: Not on file  Stress: Not on file  Social Connections: Not on file     Family History: The patient's family history includes Cancer in her father; Diabetes in her mother and sister; Hypertension in her mother.  ROS:   Please see the history of present illness.     All other systems reviewed and are negative.  EKGs/Labs/Other Studies Reviewed:    The following studies were reviewed today:   EKG:  EKG is  ordered today.  The ekg ordered today demonstrates   NSR, anterior Q waves (unchanged from 09/27/2020  Recent Labs: 09/27/2020: B Natriuretic Peptide 111.2 09/28/2020: Magnesium 4.0 09/29/2020: ALT 28; BUN 95; Creatinine, Ser 3.86; Hemoglobin 9.6; Platelets 158; Potassium 4.3; Sodium 137  Recent Lipid Panel    Component Value Date/Time   CHOL 153 09/24/2020 0459   TRIG 74 09/24/2020 0459   HDL 57 09/24/2020 0459   CHOLHDL 2.7 09/24/2020 0459   VLDL 15 09/24/2020 0459   LDLCALC 81 09/24/2020 0459     Risk Assessment/Calculations:           Physical Exam:    VS:    Vitals:   03/18/21 1401  BP: 130/70  Pulse: 73  SpO2: 99%     Wt Readings from Last 3 Encounters:  02/24/21 151 lb (68.5 kg)  11/11/20 151 lb (68.5 kg)  09/29/20 157 lb 10.1 oz (71.5 kg)     GEN:  Well nourished, well developed in no acute distress. Sitting in a wheel chair HEENT: Normal NECK: No JVD at 90 degrees; No carotid bruits LYMPHATICS: No lymphadenopathy CARDIAC: RRR, SEM RUSB, murmurs, rubs, gallops RESPIRATORY:  Clear to auscultation without rales, wheezing or rhonchi  ABDOMEN: Soft, non-tender, non-distended MUSCULOSKELETAL:  No edema; No deformity  SKIN: Warm and dry NEUROLOGIC:   Alert and oriented x 3 PSYCHIATRIC:  Normal affect   ASSESSMENT:    #Systolic HF: Had no ischemic w/u 2/2 risk in the past; presumably ischemic CM. NYHA Class III. She has end stage renal disease precluding entresto/spironolactone. Will plan to continue carvedilol 6.25 mg BID. She is maintained on lasix 40 mg daily and 20 meq of potassium and tolerating this well. She is euvolemic. Will plan to follow up in 3 months.  PLAN:    In order of problems listed above:  Follow up in 3 months       Medication Adjustments/Labs and Tests Ordered: Current medicines are reviewed at length with the patient today.  Concerns regarding medicines are outlined above.   Signed, Janina Mayo, MD  03/17/2021 12:50 PM    [facility]

## 2021-03-18 ENCOUNTER — Other Ambulatory Visit: Payer: Self-pay

## 2021-03-18 ENCOUNTER — Encounter: Payer: Self-pay | Admitting: Internal Medicine

## 2021-03-18 ENCOUNTER — Ambulatory Visit: Payer: Medicare PPO | Admitting: Internal Medicine

## 2021-03-18 VITALS — BP 130/70 | HR 73 | Ht 63.0 in | Wt 145.8 lb

## 2021-03-18 DIAGNOSIS — I5022 Chronic systolic (congestive) heart failure: Secondary | ICD-10-CM | POA: Diagnosis not present

## 2021-03-18 NOTE — Patient Instructions (Addendum)
Medication Instructions:  No Changes In Medications at this time.  *If you need a refill on your cardiac medications before your next appointment, please call your pharmacy*  Follow-Up: At [facility], you and your health needs are our priority.  As part of our continuing mission to provide you with exceptional heart care, we have created designated Provider Care Teams.  These Care Teams include your primary Cardiologist (physician) and Advanced Practice Providers (APPs -  Physician Assistants and Nurse Practitioners) who all work together to provide you with the care you need, when you need it.  Your next appointment:   3 month(s)  The format for your next appointment:   In Person  Provider:   Dr. Harl Bowie

## 2021-05-21 ENCOUNTER — Encounter (HOSPITAL_COMMUNITY): Payer: Medicare PPO

## 2021-05-25 ENCOUNTER — Other Ambulatory Visit (HOSPITAL_COMMUNITY): Payer: Self-pay | Admitting: *Deleted

## 2021-05-26 ENCOUNTER — Other Ambulatory Visit: Payer: Self-pay

## 2021-05-26 ENCOUNTER — Encounter (HOSPITAL_COMMUNITY)
Admission: RE | Admit: 2021-05-26 | Discharge: 2021-05-26 | Disposition: A | Discharge status: Home / Self Care | Payer: Medicare PPO | Source: Ambulatory Visit | Attending: Nephrology | Admitting: Nephrology

## 2021-05-26 DIAGNOSIS — D631 Anemia in chronic kidney disease: Secondary | ICD-10-CM | POA: Diagnosis present

## 2021-05-26 DIAGNOSIS — N189 Chronic kidney disease, unspecified: Secondary | ICD-10-CM | POA: Insufficient documentation

## 2021-05-26 MED ORDER — SODIUM CHLORIDE 0.9 % IV SOLN
510.0000 mg | INTRAVENOUS | Status: DC
  Administered 2021-05-26: 510 mg via INTRAVENOUS
  Filled 2021-05-26: qty 510

## 2021-06-02 ENCOUNTER — Encounter (HOSPITAL_COMMUNITY)
Admission: RE | Admit: 2021-06-02 | Discharge: 2021-06-02 | Disposition: A | Discharge status: Home / Self Care | Payer: Medicare PPO | Source: Ambulatory Visit | Attending: Nephrology | Admitting: Nephrology

## 2021-06-02 DIAGNOSIS — D631 Anemia in chronic kidney disease: Secondary | ICD-10-CM | POA: Diagnosis not present

## 2021-06-02 MED ORDER — SODIUM CHLORIDE 0.9 % IV SOLN
510.0000 mg | INTRAVENOUS | Status: DC
  Administered 2021-06-02: 510 mg via INTRAVENOUS
  Filled 2021-06-02: qty 510

## 2021-06-30 ENCOUNTER — Encounter: Payer: Self-pay | Admitting: Internal Medicine

## 2021-06-30 ENCOUNTER — Other Ambulatory Visit: Payer: Self-pay

## 2021-06-30 ENCOUNTER — Ambulatory Visit: Payer: Medicare PPO | Admitting: Internal Medicine

## 2021-06-30 VITALS — BP 130/62 | HR 69 | Ht 63.0 in | Wt 144.6 lb

## 2021-06-30 DIAGNOSIS — I5022 Chronic systolic (congestive) heart failure: Secondary | ICD-10-CM

## 2021-06-30 NOTE — Progress Notes (Signed)
?Cardiology Office Note:   ? ?Date:  06/30/2021  ? ?ID:  [name], DOB 11/11/1923, MRN [id number] ? ?PCP:  Jilda Panda, MD ?  ?[facility] Providers ?Cardiologist:  Janina Mayo, MD    ? ?Referring MD: Jilda Panda, MD  ? ?No chief complaint on file. ?Systolic HF ? ?History of Present Illness:   ?Initial Visit ?[name] is a 88 y.o. female with a hx of HTN, CKD,  former patient of Dr. Claiborne Billings for systolic HF, stroke with BL carotid artery stenosis high grade 80-99% started on DAPT for 3 months then plavix, referral to re-establish care ? ?She has a mild stroke 09/23/2020. She had slurred speech and presented to the hospital. CT showed no acute changes. MRI showed several punctate/small infarcts at left MCA and MCA/PCA territory. Carotid stenosis was noted above. Patient and family declined endarterectomy with shared decision making. She was started on DAPT, then plavix. She had some right sided weakness. She had an echo showing EF 30-35% global hypokinesis improved from 2018. No LV thrombus ? ?She worked with physical therapy afterwards. She went to an independent facility at [facility] on [address]  She's done well. At home, she ambulates around the house and active with Bible study. ? ?She sees a nephrologist for CKD stage V. She follows with Dr. Hollie Salk. They are watching it. Unlikely IHD candidate with her age. Crt 3.3/ BUN 79  12/03/2020 ? ?She was last seen 07/27/2016. She was diagnosed with systolic CHF in March of that year with EF 20-25%. She presented to the hospital in March 2018 and found to have systolic CHF. She was not deemed to be candidate of ischemic w/u. At that time she was on coreg 6.25 mg BID, lasix 40 in the AM, 20 in the afternoon and extra 20 with weight gain. As well as metolazone 2.5 mg twice a week (Mon and Thursday). Hydralazine 25 mg BID, imdur 30 mg daily. ? ?Her blood pressures checked at home. She has home health and they check the blood pressures.  ? ?Social Hx:  she lives at home with her daughter.  ? ? ?09/27/2020 ?BNP 111 ?Crt 4.02; BUN 92, GFR ? ?Interim Hx ?[name] comes in for follow up. She is doing well. She has no chest pressure, SOB. No significant LE edema. She just celebrated her 65th birthday. No recent hospitalizations. She is tolerated her medications well. She had dinner with her family ? ? ?TTE 09/24/2020 ?EF-30-35% ?Significant MAC on the mitral valve, moderate MR ?Aortic valve calcification- mild AI. No AS valve area 1.65 cm2 ? ?Carotid Doppler showed bilateral ICA bulb high-grade 80 to 99% stenosis ? ?06/09/2016 ?EF 20-25%, inferior akinesis, moderate MR/MAC. RVSP 31 mmHg. Dilate LV. ? ? ?Past Medical History:  ?Diagnosis Date  ? Breast mass   ? Cervical spondylosis   ? Dilated cardiomyopathy ([place]) 05/2016  ? Mild LVH with EF of 20 1125%. Hypokinesis with inferior akinesis. GR 1 DD noted with elevated LVEDP. Moderate LA dilation. Moderate mitral regurgitation. Aortic valve aortic valve calcification/sclerosis without stenosis.  ? Hypertension   ? ? ?Past Surgical History:  ?Procedure Laterality Date  ? APPENDECTOMY    ? BACK SURGERY    ? x 4  ? CATARACT EXTRACTION    ? KNEE SURGERY    ? x 2  ? TRANSTHORACIC ECHOCARDIOGRAM  05/2016  ? Mild LVH with EF of 20 1125%. Hypokinesis with inferior akinesis. GR 1 DD noted with elevated LVEDP. Moderate LA dilation.  Moderate mitral regurgitation. Aortic valve aortic valve calcification/sclerosis without stenosis.  ? VENTRAL HERNIA REPAIR N/A 09/14/2013  ? Procedure: LAPAROSCOPIC ventral HERNIA REPAIR with mesh ;  Surgeon: Shann Medal, MD;  Location: [facility];  Service: General;  Laterality: N/A;  ? ? ?Current Medications: ?No outpatient medications have been marked as taking for the 06/30/21 encounter (Appointment) with Janina Mayo, MD.  ?  ? ?Allergies:   Patient has no known allergies.  ? ?Social History  ? ?Socioeconomic History  ? Marital status: Widowed  ?  Spouse name: Not on file  ? Number of children: 2  ?  Years of education: Not on file  ? Highest education level: Not on file  ?Occupational History  ? Not on file  ?Tobacco Use  ? Smoking status: Former  ?  Types: Cigarettes  ? Smokeless tobacco: Never  ?Substance and Sexual Activity  ? Alcohol use: No  ? Drug use: No  ? Sexual activity: Not on file  ?Other Topics Concern  ? Not on file  ?Social History Narrative  ? 02/24/21 lives alone, dgtr 5 min away, cameras in house, caregivers  ? ?Social Determinants of Health  ? ?Financial Resource Strain: Not on file  ?Food Insecurity: Not on file  ?Transportation Needs: Not on file  ?Physical Activity: Not on file  ?Stress: Not on file  ?Social Connections: Not on file  ?  ? ?Family History: ?The patient's family history includes Cancer in her father; Diabetes in her mother and sister; Hypertension in her mother. ? ?ROS:   ?Please see the history of present illness.    ? All other systems reviewed and are negative. ? ?EKGs/Labs/Other Studies Reviewed:   ? ?The following studies were reviewed today: ? ? ?EKG:  EKG is  ordered today.  The ekg ordered today demonstrates  ? ?EKG:03/18/2021-NSR, anterior Q waves (unchanged from 09/27/2020) ? ?Recent Labs: ?09/27/2020: B Natriuretic Peptide 111.2 ?09/28/2020: Magnesium 4.0 ?09/29/2020: ALT 28; BUN 95; Creatinine, Ser 3.86; Hemoglobin 9.6; Platelets 158; Potassium 4.3; Sodium 137  ? ? ?Recent Lipid Panel ?   ?Component Value Date/Time  ? CHOL 153 09/24/2020 0459  ? TRIG 74 09/24/2020 0459  ? HDL 57 09/24/2020 0459  ? CHOLHDL 2.7 09/24/2020 0459  ? VLDL 15 09/24/2020 0459  ? Catherine 81 09/24/2020 0459  ? ? ? ?Risk Assessment/Calculations:   ?  ? ?    ? ?Physical Exam:   ? ?VS:   ? ?Vitals:  ? 06/30/21 1319  ?BP: 130/62  ?Pulse: 69  ?SpO2: 98%  ? ? ? ?Wt Readings from Last 3 Encounters:  ?06/02/21 145 lb (65.8 kg)  ?03/18/21 145 lb 12.8 oz (66.1 kg)  ?02/24/21 151 lb (68.5 kg)  ?  ? ?GEN:  Well nourished, well developed in no acute distress. Sitting in a wheel chair ?HEENT: Normal ?NECK:  No JVD at 90 degrees; No carotid bruits ?LYMPHATICS: No lymphadenopathy ?CARDIAC: RRR, 3/6 SEM RUSB, murmurs, rubs, gallops ?RESPIRATORY:  Clear to auscultation without rales, wheezing or rhonchi  ?ABDOMEN: Soft, non-tender, non-distended ?MUSCULOSKELETAL:  No edema; No deformity  ?SKIN: Warm and dry ?NEUROLOGIC:  Alert and oriented x 3 ?PSYCHIATRIC:  Normal affect  ? ?ASSESSMENT:   ? ?#Systolic HF: EF 32-95% Had no ischemic w/u 2/2 risk in the past; presumably ischemic CM. NYHA Class III. She has end stage renal disease precluding entresto/spironolactone. Will plan to continue carvedilol 6.25 mg BID. She is maintained on lasix 40 mg daily and 20 meq of potassium  and tolerating this well. She is euvolemic. She remains doing well.  ? ?PLAN:   ? ?In order of problems listed above: ? ?Follow up in 6 months ? ?   ? ?Medication Adjustments/Labs and Tests Ordered: ?Current medicines are reviewed at length with the patient today.  Concerns regarding medicines are outlined above.  ? ?Signed, ?Janina Mayo, MD  ?06/30/2021 10:54 AM    ?[place] ? ?

## 2021-06-30 NOTE — Patient Instructions (Signed)
Medication Instructions:  ?No Changes In Medications at this time.  ?*If you need a refill on your cardiac medications before your next appointment, please call your pharmacy* ? ?Follow-Up: ?At [facility], you and your health needs are our priority.  As part of our continuing mission to provide you with exceptional heart care, we have created designated Provider Care Teams.  These Care Teams include your primary Cardiologist (physician) and Advanced Practice Providers (APPs -  Physician Assistants and Nurse Practitioners) who all work together to provide you with the care you need, when you need it. ? ?Your next appointment:   ?6 month(s) ? ?The format for your next appointment:   ?In Person ? ?Provider:   ?Janina Mayo, MD   ? ?

## 2021-08-25 ENCOUNTER — Ambulatory Visit: Payer: Medicare PPO | Admitting: Adult Health

## 2021-09-15 ENCOUNTER — Ambulatory Visit: Payer: Medicare PPO | Admitting: Adult Health

## 2022-01-05 ENCOUNTER — Encounter: Payer: Self-pay | Admitting: Internal Medicine

## 2022-01-05 ENCOUNTER — Ambulatory Visit: Payer: Medicare PPO | Attending: Internal Medicine | Admitting: Internal Medicine

## 2022-01-05 VITALS — BP 138/72 | HR 67 | Ht 63.0 in | Wt 140.0 lb

## 2022-01-05 DIAGNOSIS — I5022 Chronic systolic (congestive) heart failure: Secondary | ICD-10-CM

## 2022-01-05 NOTE — Progress Notes (Signed)
Cardiology Office Note:    Date:  01/05/2022   ID:  [name], DOB 21-Feb-1924, MRN [id number]  PCP:  Rebecca Panda, MD   [place] Providers Cardiologist:  Rebecca Mayo, MD     Referring MD: Rebecca Panda, MD   No chief complaint on file. Systolic HF  History of Present Illness:   Initial Visit [name] is a 88 y.o. female with a hx of HTN, CKD,  former patient of [name] for systolic HF, stroke with BL carotid artery stenosis high grade 80-99% started on DAPT for 3 months then plavix, referral to re-establish care  She has a mild stroke 09/23/2020. She had slurred speech and presented to the hospital. CT showed no acute changes. MRI showed several punctate/small infarcts at left MCA and MCA/PCA territory. Carotid stenosis was noted above. Patient and family declined endarterectomy with shared decision making. She was started on DAPT, then plavix. She had some right sided weakness. She had an echo showing EF 30-35% global hypokinesis improved from 2018. No LV thrombus  She worked with physical therapy afterwards. She went to an independent facility at [facility] on [address]  She's done well. At home, she ambulates around the house and active with Bible study.  She sees a nephrologist for CKD stage V. She follows with Dr. Hollie Salk. They are watching it. Unlikely IHD candidate with her age. Crt 3.3/ BUN 79  12/03/2020  She was last seen 07/27/2016. She was diagnosed with systolic CHF in March of that year with EF 20-25%. She presented to the hospital in March 2018 and found to have systolic CHF. She was not deemed to be candidate of ischemic w/u. At that time she was on coreg 6.25 mg BID, lasix 40 in the AM, 20 in the afternoon and extra 20 with weight gain. As well as metolazone 2.5 mg twice a week (Mon and Thursday). Hydralazine 25 mg BID, imdur 30 mg daily.  Her blood pressures checked at home. She has home health and they check the blood pressures.   Social Hx:  she lives at home with her daughter.    09/27/2020 BNP 111 Crt 4.02; BUN 92, GFR  Interim Hx [name] comes in for follow up. She is doing well. She has no chest pressure, SOB. No significant LE edema. She just celebrated her 61th birthday. No recent hospitalizations. She is tolerated her medications well. She had dinner with her family  Interim 01/05/2022 She she feels well today. She denies PND/orthopnea or LE edema. She is here with her son.   TTE 09/24/2020 EF-30-35% Significant MAC on the mitral valve, moderate MR Aortic valve calcification- mild AI. No AS valve area 1.65 cm2  Carotid Doppler showed bilateral ICA bulb high-grade 80 to 99% stenosis  06/09/2016 EF 20-25%, inferior akinesis, moderate MR/MAC. RVSP 31 mmHg. Dilate LV.   Past Medical History:  Diagnosis Date   Breast mass    Cervical spondylosis    Dilated cardiomyopathy ([place]) 05/2016   Mild LVH with EF of 20 1125%. Hypokinesis with inferior akinesis. GR 1 DD noted with elevated LVEDP. Moderate LA dilation. Moderate mitral regurgitation. Aortic valve aortic valve calcification/sclerosis without stenosis.   Hypertension     Past Surgical History:  Procedure Laterality Date   APPENDECTOMY     BACK SURGERY     x 4   CATARACT EXTRACTION     KNEE SURGERY     x 2   TRANSTHORACIC ECHOCARDIOGRAM  05/2016   Mild  LVH with EF of 20 1125%. Hypokinesis with inferior akinesis. GR 1 DD noted with elevated LVEDP. Moderate LA dilation. Moderate mitral regurgitation. Aortic valve aortic valve calcification/sclerosis without stenosis.   VENTRAL HERNIA REPAIR N/A 09/14/2013   Procedure: LAPAROSCOPIC ventral HERNIA REPAIR with mesh ;  Surgeon: Shann Medal, MD;  Location: [facility];  Service: General;  Laterality: N/A;    Current Medications: No outpatient medications have been marked as taking for the 01/05/22 encounter (Appointment) with Rebecca Mayo, MD.     Allergies:   Patient has no known allergies.   Social History    Socioeconomic History   Marital status: Widowed    Spouse name: Not on file   Number of children: 2   Years of education: Not on file   Highest education level: Not on file  Occupational History   Not on file  Tobacco Use   Smoking status: Former    Types: Cigarettes   Smokeless tobacco: Never  Substance and Sexual Activity   Alcohol use: No   Drug use: No   Sexual activity: Not on file  Other Topics Concern   Not on file  Social History Narrative   02/24/21 lives alone, dgtr 5 min away, cameras in house, caregivers   Social Determinants of Health   Financial Resource Strain: Not on file  Food Insecurity: Not on file  Transportation Needs: Not on file  Physical Activity: Not on file  Stress: Not on file  Social Connections: Not on file     Family History: The patient's family history includes Cancer in her father; Diabetes in her mother and sister; Hypertension in her mother.  ROS:   Please see the history of present illness.     All other systems reviewed and are negative.  EKGs/Labs/Other Studies Reviewed:    The following studies were reviewed today:   EKG:  EKG is  ordered today.  The ekg ordered today demonstrates   EKG:03/18/2021-NSR, anterior Q waves (unchanged from 09/27/2020)  Recent Labs: No results found for requested labs within last 365 days.    Recent Lipid Panel    Component Value Date/Time   CHOL 153 09/24/2020 0459   TRIG 74 09/24/2020 0459   HDL 57 09/24/2020 0459   CHOLHDL 2.7 09/24/2020 0459   VLDL 15 09/24/2020 0459   LDLCALC 81 09/24/2020 0459     Risk Assessment/Calculations:           Physical Exam:    VS:    Vitals:   01/05/22 1113  BP: 138/72  Pulse: 67  SpO2: 98%     Wt Readings from Last 3 Encounters:  06/30/21 144 lb 9.6 oz (65.6 kg)  06/02/21 145 lb (65.8 kg)  03/18/21 145 lb 12.8 oz (66.1 kg)     GEN:  Well nourished, well developed in no acute distress. Sitting in a wheel chair HEENT:  Normal NECK: No JVD at 90 degrees; No carotid bruits LYMPHATICS: No lymphadenopathy CARDIAC: RRR, 3/6 SEM RUSB, murmurs, rubs, gallops RESPIRATORY:  Clear to auscultation without rales, wheezing or rhonchi  ABDOMEN: Soft, non-tender, non-distended MUSCULOSKELETAL:  No edema; No deformity  SKIN: Warm and dry NEUROLOGIC:  Alert and oriented x 3 PSYCHIATRIC:  Normal affect   ASSESSMENT:    #Systolic HF: EF 62-22% Had no ischemic w/u 2/2 risk in the past; presumably ischemic CM. NYHA Class III. She has end stage renal disease precluding entresto/spironolactone. Will plan to continue carvedilol 6.25 mg BID. She is maintained on lasix  40 mg daily and 20 meq of potassium and tolerating this well. She is euvolemic. She remains doing well.  She's had some soft Bps, no plans for GDMT uptitration at this time.   PLAN:    In order of problems listed above:  Follow up in 6 months      Medication Adjustments/Labs and Tests Ordered: Current medicines are reviewed at length with the patient today.  Concerns regarding medicines are outlined above.   Signed, Rebecca Mayo, MD  01/05/2022 11:10 AM    [place]

## 2022-01-05 NOTE — Patient Instructions (Signed)
Medication Instructions:  Your physician recommends that you continue on your current medications as directed. Please refer to the Current Medication list given to you today.  *If you need a refill on your cardiac medications before your next appointment, please call your pharmacy*   Follow-Up: At [facility], you and your health needs are our priority.  As part of our continuing mission to provide you with exceptional heart care, we have created designated Provider Care Teams.  These Care Teams include your primary Cardiologist (physician) and Advanced Practice Providers (APPs -  Physician Assistants and Nurse Practitioners) who all work together to provide you with the care you need, when you need it.  We recommend signing up for the patient portal called "MyChart".  Sign up information is provided on this After Visit Summary.  MyChart is used to connect with patients for Virtual Visits (Telemedicine).  Patients are able to view lab/test results, encounter notes, upcoming appointments, etc.  Non-urgent messages can be sent to your provider as well.   To learn more about what you can do with MyChart, go to [email or web address]://www.mychart.com.    Your next appointment:   6 month(s)  The format for your next appointment:   In Person  Provider:   Branch, Mary E, MD  

## 2022-06-05 ENCOUNTER — Other Ambulatory Visit: Payer: Self-pay

## 2022-06-05 ENCOUNTER — Emergency Department (HOSPITAL_COMMUNITY): Payer: Medicare PPO

## 2022-06-05 ENCOUNTER — Encounter (HOSPITAL_COMMUNITY): Payer: Self-pay | Admitting: Family Medicine

## 2022-06-05 ENCOUNTER — Observation Stay (HOSPITAL_COMMUNITY)
Admission: EM | Admit: 2022-06-05 | Discharge: 2022-06-08 | Disposition: A | Discharge status: Skilled Nursing Facility | Payer: Medicare PPO | Attending: Internal Medicine | Admitting: Internal Medicine

## 2022-06-05 DIAGNOSIS — Z7902 Long term (current) use of antithrombotics/antiplatelets: Secondary | ICD-10-CM | POA: Insufficient documentation

## 2022-06-05 DIAGNOSIS — Z79899 Other long term (current) drug therapy: Secondary | ICD-10-CM | POA: Insufficient documentation

## 2022-06-05 DIAGNOSIS — N184 Chronic kidney disease, stage 4 (severe): Secondary | ICD-10-CM | POA: Diagnosis present

## 2022-06-05 DIAGNOSIS — I130 Hypertensive heart and chronic kidney disease with heart failure and stage 1 through stage 4 chronic kidney disease, or unspecified chronic kidney disease: Secondary | ICD-10-CM | POA: Insufficient documentation

## 2022-06-05 DIAGNOSIS — I5042 Chronic combined systolic (congestive) and diastolic (congestive) heart failure: Secondary | ICD-10-CM | POA: Insufficient documentation

## 2022-06-05 DIAGNOSIS — D649 Anemia, unspecified: Secondary | ICD-10-CM | POA: Diagnosis present

## 2022-06-05 DIAGNOSIS — S82401A Unspecified fracture of shaft of right fibula, initial encounter for closed fracture: Secondary | ICD-10-CM | POA: Diagnosis present

## 2022-06-05 DIAGNOSIS — W19XXXA Unspecified fall, initial encounter: Secondary | ICD-10-CM | POA: Diagnosis not present

## 2022-06-05 DIAGNOSIS — Z87891 Personal history of nicotine dependence: Secondary | ICD-10-CM | POA: Diagnosis not present

## 2022-06-05 DIAGNOSIS — Z8673 Personal history of transient ischemic attack (TIA), and cerebral infarction without residual deficits: Secondary | ICD-10-CM | POA: Diagnosis not present

## 2022-06-05 DIAGNOSIS — D509 Iron deficiency anemia, unspecified: Secondary | ICD-10-CM

## 2022-06-05 DIAGNOSIS — S82831A Other fracture of upper and lower end of right fibula, initial encounter for closed fracture: Secondary | ICD-10-CM | POA: Diagnosis not present

## 2022-06-05 DIAGNOSIS — I10 Essential (primary) hypertension: Secondary | ICD-10-CM

## 2022-06-05 DIAGNOSIS — M25561 Pain in right knee: Secondary | ICD-10-CM

## 2022-06-05 DIAGNOSIS — I5040 Unspecified combined systolic (congestive) and diastolic (congestive) heart failure: Secondary | ICD-10-CM | POA: Diagnosis present

## 2022-06-05 LAB — CBC WITH DIFFERENTIAL/PLATELET
Abs Immature Granulocytes: 0.05 10*3/uL (ref 0.00–0.07)
Basophils Absolute: 0.1 10*3/uL (ref 0.0–0.1)
Basophils Relative: 1 %
Eosinophils Absolute: 0.2 10*3/uL (ref 0.0–0.5)
Eosinophils Relative: 2 %
HCT: 32.2 % — ABNORMAL LOW (ref 36.0–46.0)
Hemoglobin: 10.2 g/dL — ABNORMAL LOW (ref 12.0–15.0)
Immature Granulocytes: 1 %
Lymphocytes Relative: 15 %
Lymphs Abs: 1.5 10*3/uL (ref 0.7–4.0)
MCH: 31.2 pg (ref 26.0–34.0)
MCHC: 31.7 g/dL (ref 30.0–36.0)
MCV: 98.5 fL (ref 80.0–100.0)
Monocytes Absolute: 0.9 10*3/uL (ref 0.1–1.0)
Monocytes Relative: 9 %
Neutro Abs: 7.4 10*3/uL (ref 1.7–7.7)
Neutrophils Relative %: 72 %
Platelets: 117 10*3/uL — ABNORMAL LOW (ref 150–400)
RBC: 3.27 MIL/uL — ABNORMAL LOW (ref 3.87–5.11)
RDW: 16.1 % — ABNORMAL HIGH (ref 11.5–15.5)
WBC: 10.1 10*3/uL (ref 4.0–10.5)
nRBC: 0 % (ref 0.0–0.2)

## 2022-06-05 LAB — BASIC METABOLIC PANEL
Anion gap: 8 (ref 5–15)
BUN: 77 mg/dL — ABNORMAL HIGH (ref 8–23)
CO2: 22 mmol/L (ref 22–32)
Calcium: 8.6 mg/dL — ABNORMAL LOW (ref 8.9–10.3)
Chloride: 110 mmol/L (ref 98–111)
Creatinine, Ser: 2.82 mg/dL — ABNORMAL HIGH (ref 0.44–1.00)
GFR, Estimated: 15 mL/min — ABNORMAL LOW (ref >60)
Glucose, Bld: 98 mg/dL (ref 70–99)
Potassium: 3.8 mmol/L (ref 3.5–5.1)
Sodium: 140 mmol/L (ref 135–145)

## 2022-06-05 MED ORDER — HYDROCODONE-ACETAMINOPHEN 5-325 MG PO TABS
1.0000 | ORAL_TABLET | Freq: Once | ORAL | Status: AC
  Administered 2022-06-05: 1 via ORAL
  Filled 2022-06-05: qty 1

## 2022-06-05 MED ORDER — FELODIPINE ER 10 MG PO TB24
10.0000 mg | ORAL_TABLET | Freq: Every day | ORAL | Status: DC
  Administered 2022-06-06 – 2022-06-08 (×2): 10 mg via ORAL
  Filled 2022-06-05 (×3): qty 1

## 2022-06-05 MED ORDER — ATORVASTATIN CALCIUM 40 MG PO TABS
40.0000 mg | ORAL_TABLET | Freq: Every day | ORAL | Status: DC
  Administered 2022-06-06 – 2022-06-08 (×3): 40 mg via ORAL
  Filled 2022-06-05 (×3): qty 1

## 2022-06-05 MED ORDER — CARVEDILOL 6.25 MG PO TABS
6.2500 mg | ORAL_TABLET | Freq: Two times a day (BID) | ORAL | Status: DC
  Administered 2022-06-06 – 2022-06-08 (×5): 6.25 mg via ORAL
  Filled 2022-06-05 (×5): qty 1

## 2022-06-05 MED ORDER — ACETAMINOPHEN 325 MG PO TABS: 650.0000 mg | ORAL_TABLET | Freq: Four times a day (QID) | ORAL | Status: DC | PRN

## 2022-06-05 MED ORDER — ENOXAPARIN SODIUM 30 MG/0.3ML IJ SOSY
30.0000 mg | PREFILLED_SYRINGE | INTRAMUSCULAR | Status: DC
  Administered 2022-06-06 – 2022-06-08 (×3): 30 mg via SUBCUTANEOUS
  Filled 2022-06-05 (×3): qty 0.3

## 2022-06-05 MED ORDER — HYDROCODONE-ACETAMINOPHEN 5-325 MG PO TABS
1.0000 | ORAL_TABLET | Freq: Four times a day (QID) | ORAL | Status: DC | PRN
  Administered 2022-06-06 – 2022-06-07 (×2): 1 via ORAL
  Filled 2022-06-05 (×2): qty 1

## 2022-06-05 MED ORDER — GABAPENTIN 300 MG PO CAPS
300.0000 mg | ORAL_CAPSULE | Freq: Every day | ORAL | Status: DC
  Administered 2022-06-06 – 2022-06-07 (×2): 300 mg via ORAL
  Filled 2022-06-05 (×2): qty 1

## 2022-06-05 MED ORDER — FUROSEMIDE 20 MG PO TABS: 20.0000 mg | ORAL_TABLET | Freq: Every day | ORAL | Status: DC

## 2022-06-05 MED ORDER — ALLOPURINOL 100 MG PO TABS
100.0000 mg | ORAL_TABLET | Freq: Every day | ORAL | Status: DC
  Administered 2022-06-06 – 2022-06-08 (×3): 100 mg via ORAL
  Filled 2022-06-05 (×3): qty 1

## 2022-06-05 MED ORDER — MORPHINE SULFATE (PF) 2 MG/ML IV SOLN: 1.0000 mg | INTRAVENOUS | Status: DC | PRN

## 2022-06-05 MED ORDER — CLOPIDOGREL BISULFATE 75 MG PO TABS
75.0000 mg | ORAL_TABLET | Freq: Every day | ORAL | Status: DC
  Administered 2022-06-06 – 2022-06-08 (×3): 75 mg via ORAL
  Filled 2022-06-05 (×3): qty 1

## 2022-06-05 MED ORDER — FUROSEMIDE 20 MG PO TABS
40.0000 mg | ORAL_TABLET | Freq: Every day | ORAL | Status: DC
  Administered 2022-06-06 – 2022-06-08 (×3): 40 mg via ORAL
  Filled 2022-06-05 (×3): qty 2

## 2022-06-05 MED ORDER — CALCITRIOL 0.5 MCG PO CAPS
0.5000 ug | ORAL_CAPSULE | Freq: Every day | ORAL | Status: DC
  Administered 2022-06-06 – 2022-06-08 (×3): 0.5 ug via ORAL
  Filled 2022-06-05 (×3): qty 1

## 2022-06-05 NOTE — ED Notes (Signed)
Multiple attempts by RN and Paramedic for blood draw.  Unsuccessful.  EDP notified.  Will try again after letting the patient have some time.

## 2022-06-05 NOTE — H&P (Signed)
History and Physical    Patient: [name] [id number] DOB: 07-31-1923 DOA: 06/05/2022 DOS: the patient was seen and examined on 06/05/2022 PCP: Jilda Panda, MD  Patient coming from: Home  Chief Complaint:  Chief Complaint  Patient presents with   Fall   Knee Pain   HPI: [name] is a 89 y.o. female with medical history significant of HTN, CKD 4, systolic and diastolic heart failure, CVA with bilateral carotid artery stenosis who presents following a mechanical fall.   Daughter received a call from pt following a fall. She found pt laying on floor on her side with feet under a couch. Pt ambulates with a walker and slipped on the kitchen fall with her socks on. No hx of recurrent falls. No dizziness or lightheadedness.  Daughter lives nearby and helps with medications. She also has caregivers M-F for half day to help with food and bathing.   In the ED, she was afebrile, BP up to 174/80 on room air.   No leukocytosis. Hgb of 10.2 around her baseline.   No significant electrolyte abnormalities. Creatinine of 2.82.  EKG on my review with left axis deviation, LAFB.  X-ray was not revealing for acute displaced distal right fibular fracture proximal to the ankle mortise.  Orthopedic Dr. Sammuel Hines was consulted by EDP who recommended placing on cam boot and weightbearing as tolerated.  Ortho to consult in the morning.  Hospitalist consulted for pain management and PT.  Review of Systems: As mentioned in the history of present illness. All other systems reviewed and are negative. Past Medical History:  Diagnosis Date   Breast mass    Cervical spondylosis    Dilated cardiomyopathy ([place]) 05/2016   Mild LVH with EF of 20 1125%. Hypokinesis with inferior akinesis. GR 1 DD noted with elevated LVEDP. Moderate LA dilation. Moderate mitral regurgitation. Aortic valve aortic valve calcification/sclerosis without stenosis.   Hypertension    Past Surgical History:  Procedure  Laterality Date   APPENDECTOMY     BACK SURGERY     x 4   CATARACT EXTRACTION     KNEE SURGERY     x 2   TRANSTHORACIC ECHOCARDIOGRAM  05/2016   Mild LVH with EF of 20 1125%. Hypokinesis with inferior akinesis. GR 1 DD noted with elevated LVEDP. Moderate LA dilation. Moderate mitral regurgitation. Aortic valve aortic valve calcification/sclerosis without stenosis.   VENTRAL HERNIA REPAIR N/A 09/14/2013   Procedure: LAPAROSCOPIC ventral HERNIA REPAIR with mesh ;  Surgeon: Shann Medal, MD;  Location: [facility];  Service: General;  Laterality: N/A;   Social History:  reports that she has quit smoking. Her smoking use included cigarettes. She has never used smokeless tobacco. She reports that she does not drink alcohol and does not use drugs.  No Known Allergies  Family History  Problem Relation Age of Onset   Diabetes Mother    Hypertension Mother    Cancer Father        uknown kind of cancer   Diabetes Sister     Prior to Admission medications   Medication Sig Start Date End Date Taking? Authorizing Provider  acetaminophen (TYLENOL) 325 MG tablet Take 650 mg by mouth every 6 (six) hours as needed for mild pain.   Yes [provider]  allopurinol (ZYLOPRIM) 100 MG tablet Take 100 mg by mouth daily.  09/26/13  Yes [provider]  atorvastatin (LIPITOR) 40 MG tablet Take 1 tablet (40 mg total) by mouth daily. 09/25/20  Yes Jennye Boroughs, MD  calcitRIOL (ROCALTROL) 0.5 MCG capsule Take 0.5 mcg by mouth daily.   Yes [provider]  carvedilol (COREG) 6.25 MG tablet Take 1 tablet (6.25 mg total) by mouth 2 (two) times daily with a meal. Please schedule an appointment for further refills 12/19/17  Yes Troy Sine, MD  clopidogrel (PLAVIX) 75 MG tablet Take 75 mg by mouth daily.   Yes [provider]  felodipine (PLENDIL) 10 MG 24 hr tablet Take 10 mg by mouth daily.   Yes [provider]  furosemide (LASIX) 40 MG tablet Take 0.5-1 tablets (20-40  mg total) by mouth 2 (two) times daily. Take 40 mg in the morning and 20 mg in afternoon, may take extra 20 mg dose in the afternoon as needed 09/25/20  Yes Jennye Boroughs, MD  gabapentin (NEURONTIN) 300 MG capsule Take 300 mg by mouth at bedtime.   Yes [provider]  Multiple Vitamins-Minerals (PRESERVISION AREDS PO) Take 1 capsule by mouth daily.    Yes [provider]    Physical Exam: Vitals:   06/05/22 2100 06/05/22 2130 06/05/22 2230 06/05/22 2255  BP: (!) 147/68 (!) 142/65  (!) 152/72  Pulse:  67  68  Resp: '11 13  18  '$ Temp:    (!) 97.5 F (36.4 C)  TempSrc:    Oral  SpO2:  98%  (!) 88%  Weight:   63 kg   Height:   '5\' 3"'$  (1.6 m)    Constitutional: NAD, calm, comfortable, elderly female laying flat in bed asleep. Woke easily to voice.  Eyes: lids and conjunctivae normal ENMT: Mucous membranes are moist.  Neck: normal, supple Respiratory: clear to auscultation bilaterally, no wheezing, no crackles. Normal respiratory effort. No accessory muscle use.  Cardiovascular: Regular rate and rhythm, no murmurs / rubs / gallops. No extremity edema. +2 pedal pulse to left LE. Right foot in cam boot so unable to access DP. Abdomen: no tenderness, soft, non-distended. Bowel sounds positive.  Musculoskeletal: no clubbing / cyanosis. No joint deformity upper and lower extremities. . Normal muscle tone. Right foot in cam boot  Skin: no rashes, lesions, ulcers. No induration Neurologic: CN 2-12 grossly intact.  Psychiatric: Normal judgment and insight. Alert and oriented x 3. Normal mood. Data Reviewed:  See HPI  Assessment and Plan: * Right fibular fracture -acute displaced distal right fibular fracture proximal to the ankle mortise following mechanical fall -baseline ambulates with walker -CAM boot placed and weight bear as tolerated. Orthopedic surgery to evaluate in the morning -PT evaluation in the morning -PRN Tylenol, Norco and IV morphine for pain based on  severity  HTN (hypertension) -mildly elevated. Continue home CCB, beta-blocker, and diuretic  History of CVA (cerebrovascular accident) -continue Plavix and statin  Combined systolic and diastolic congestive heart failure, NYHA class 3 ([place]) -Last echocardiogram on 09/2020 with EF of 30-35% and grade II diastolic dysfunction. Moderate mitral valve regurgitation.  -euvolemic on exam -continue beta-blocker and Lasix  Anemia -normocytic anemia close to previous baseline -check iron panel, vitamin 123456 and folic  CKD (chronic kidney disease) stage 4, GFR 15-29 ml/min (HCC) -stable. Creatinine of 2.82 which is lower than prior remote creatinine.      Advance Care Planning: DNR- verified by daughter  Consults: orthopedic surgery  Family Communication: daughter at bedside  Severity of Illness: The appropriate patient status for this patient is OBSERVATION. Observation status is judged to be reasonable and necessary in order to provide the required intensity  of service to ensure the patient's safety. The patient's presenting symptoms, physical exam findings, and initial radiographic and laboratory data in the context of their medical condition is felt to place them at decreased risk for further clinical deterioration. Furthermore, it is anticipated that the patient will be medically stable for discharge from the hospital within 2 midnights of admission.   Author: Orene Desanctis, DO 06/05/2022 11:22 PM  For on call review [email or web address].

## 2022-06-05 NOTE — Assessment & Plan Note (Signed)
-  stable. Creatinine of 2.82 which is lower than prior remote creatinine.

## 2022-06-05 NOTE — Assessment & Plan Note (Addendum)
-  continue Plavix and statin

## 2022-06-05 NOTE — Assessment & Plan Note (Addendum)
-  Last echocardiogram on 09/2020 with EF of 30-35% and grade II diastolic dysfunction. Moderate mitral valve regurgitation.  -euvolemic on exam -continue beta-blocker and Lasix

## 2022-06-05 NOTE — ED Triage Notes (Signed)
Pt BIBA from home for knee pain and swelling, unable to bear weight. Mechanical fall last night forward onto both knees. Fire check out, was able to ambulate as normal with walker. This morning unable to walk. Bruising noted to right foot that got caught under the couch with fall. Sore left leg but able to pivot. Takes plavix. No head trauma, no LOC. 2 replacements to R knee, last in 2018. Last PO 1230  138/80 Hr 78 96% ra Cbg 197

## 2022-06-05 NOTE — Assessment & Plan Note (Addendum)
-  normocytic anemia close to previous baseline -check iron panel, vitamin 123456 and folic

## 2022-06-05 NOTE — ED Provider Notes (Signed)
[place] AT [facility] Provider Note   CSN: [id number] Arrival date & time: 06/05/22  1358     History  Chief Complaint  Patient presents with   Fall   Knee Pain    [name] is a 89 y.o. female.  Patient with past medical history significant for HFrEF 30 to 35%, dilated cardiomyopathy, essential hypertension, CKD stage IV presenting with right knee and foot pain after falling last night.  States she was walking with her walker and it "got away from me" and she had a mechanical fall onto both of her knees.  She was seen by the fire department and was able to ambulate with her walker and did not transport to the hospital.  This morning she is unable to walk due to pain to her right knee and right foot.  Denies hitting her head or losing consciousness.  No neck or back pain.  No chest pain or shortness of breath.  No abdominal pain, nausea or vomiting.  Takes Plavix but no other blood thinners.  Most of her pain is to her right medial knee and right lateral foot.  Does have pain to her right hip, right ankle and right lower leg as well.  Left leg is "sore" but able to pivot on this leg for EMS.  Denies any preceding dizziness or syncope.  Right knee was replaced in 2018.  She does not know who her surgeon was.  The history is provided by the patient.  Fall Pertinent negatives include no chest pain, no abdominal pain, no headaches and no shortness of breath.  Knee Pain Associated symptoms: no fever        Home Medications Prior to Admission medications   Medication Sig Start Date End Date Taking? Authorizing Provider  acetaminophen (TYLENOL) 325 MG tablet Take 650 mg by mouth every 6 (six) hours as needed for mild pain.    [provider]  allopurinol (ZYLOPRIM) 100 MG tablet Take 100 mg by mouth daily.  09/26/13   [provider]  atorvastatin (LIPITOR) 40 MG tablet Take 1 tablet (40 mg total) by mouth daily. 09/25/20   Jennye Boroughs, MD  calcitRIOL (ROCALTROL) 0.5 MCG capsule Take 0.5 mcg by mouth daily.    [provider]  carvedilol (COREG) 6.25 MG tablet Take 1 tablet (6.25 mg total) by mouth 2 (two) times daily with a meal. Please schedule an appointment for further refills 12/19/17   Troy Sine, MD  felodipine (PLENDIL) 10 MG 24 hr tablet Take 10 mg by mouth daily.    [provider]  furosemide (LASIX) 40 MG tablet Take 0.5-1 tablets (20-40 mg total) by mouth 2 (two) times daily. Take 40 mg in the morning and 20 mg in afternoon, may take extra 20 mg dose in the afternoon as needed 09/25/20   Jennye Boroughs, MD  gabapentin (NEURONTIN) 300 MG capsule Take 300 mg by mouth at bedtime.    [provider]  Multiple Vitamins-Minerals (PRESERVISION AREDS PO) Take 1 capsule by mouth daily.     [provider]  potassium chloride SA (KLOR-CON) 20 MEQ tablet Take 20 mEq by mouth 2 (two) times daily.    [provider]      Allergies    Patient has no known allergies.    Review of Systems   Review of Systems  Constitutional:  Negative for activity change, appetite change and fever.  HENT:  Negative for congestion.  Respiratory:  Negative for cough, chest tightness and shortness of breath.   Cardiovascular:  Negative for chest pain.  Gastrointestinal:  Negative for abdominal pain, nausea and vomiting.  Genitourinary:  Negative for dysuria and hematuria.  Musculoskeletal:  Positive for arthralgias and myalgias.  Skin:  Negative for rash.  Neurological:  Negative for dizziness, weakness and headaches.   all other systems are negative except as noted in the HPI and PMH.    Physical Exam Updated Vital Signs BP (!) 151/81 (BP Location: Right Arm)   Pulse 76   Temp 97.9 F (36.6 C) (Oral)   Resp 16   Ht '5\' 3"'$  (1.6 m)   SpO2 98%   BMI 24.80 kg/m  Physical Exam Vitals and nursing note reviewed.  Constitutional:      General: She is not in acute distress.     Appearance: She is well-developed. She is not ill-appearing.  HENT:     Head: Normocephalic and atraumatic.     Mouth/Throat:     Pharynx: No oropharyngeal exudate.  Eyes:     Conjunctiva/sclera: Conjunctivae normal.     Pupils: Pupils are equal, round, and reactive to light.  Neck:     Comments: No meningismus. Cardiovascular:     Rate and Rhythm: Normal rate and regular rhythm.     Heart sounds: Normal heart sounds. No murmur heard. Pulmonary:     Effort: Pulmonary effort is normal. No respiratory distress.     Breath sounds: Normal breath sounds.  Chest:     Chest wall: No tenderness.  Abdominal:     Palpations: Abdomen is soft.     Tenderness: There is no abdominal tenderness. There is no guarding or rebound.  Musculoskeletal:        General: Swelling, tenderness and deformity present.     Cervical back: Normal range of motion and neck supple.     Comments: Ecchymosis to bilateral lateral feet.  There is swelling and effusion to the right medial knee with reduced range of motion.  No overlying warmth or erythema. Intact DP and PT pulses. Pelvis stable  Skin:    General: Skin is warm.  Neurological:     Mental Status: She is alert and oriented to person, place, and time.     Cranial Nerves: No cranial nerve deficit.     Motor: No abnormal muscle tone.     Coordination: Coordination normal.     Comments: No ataxia on finger to nose bilaterally. No pronator drift. 5/5 strength throughout. CN 2-12 intact.Equal grip strength. Sensation intact.   Psychiatric:        Behavior: Behavior normal.     ED Results / Procedures / Treatments   Labs (all labs ordered are listed, but only abnormal results are displayed) Labs Reviewed  CBC WITH DIFFERENTIAL/PLATELET  BASIC METABOLIC PANEL    EKG EKG Interpretation  Date/Time:  Saturday June 05 2022 15:12:49 EST Ventricular Rate:  77 PR Interval:  168 QRS Duration: 119 QT Interval:  498 QTC Calculation: 564 R  Axis:   -43 Text Interpretation: Sinus rhythm Left anterior fascicular block LVH with secondary repolarization abnormality Probable anterior infarct, age indeterminate Nonspecific T wave abnormality Confirmed by Ezequiel Essex [id number]) on 06/05/2022 3:19:13 PM  Radiology No results found.  Procedures Procedures    Medications Ordered in ED Medications - No data to display  ED Course/ Medical Decision Making/ A&P  Medical Decision Making Amount and/or Complexity of Data Reviewed Labs: ordered. Decision-making details documented in ED Course. Radiology: ordered and independent interpretation performed. Decision-making details documented in ED Course. ECG/medicine tests: ordered and independent interpretation performed. Decision-making details documented in ED Course.   Fall last night onto bilateral knees.  Increased pain in right knee and right foot today.  Denies head injury.  Vitals are stable.  Neurovascular intact.  Xrays of bilateral knees, bilateral feet, R ankle and R hip ordered.  No neck or back pain. No chest pain or abdominal pain.   Imaging pending at shift change.  Dr. Francia Greaves to assume care.         Final Clinical Impression(s) / ED Diagnoses Final diagnoses:  Fall, initial encounter  Acute pain of right knee    Rx / DC Orders ED Discharge Orders     None         Aunika Kirsten, Annie Main, MD 06/05/22 [phone]

## 2022-06-05 NOTE — Assessment & Plan Note (Addendum)
-  acute displaced distal right fibular fracture proximal to the ankle mortise following mechanical fall -baseline ambulates with walker -CAM boot placed and weight bear as tolerated. Orthopedic surgery to evaluate in the morning -PT evaluation in the morning -PRN Tylenol, Norco and IV morphine for pain based on severity

## 2022-06-05 NOTE — ED Provider Notes (Signed)
Seen after prior EDP.  Patient with distal right fibular fracture which is minimally displaced.  Images reviewed with Dr. Sammuel Hines (ortho) who recommends cam boot and weightbearing as tolerated. He will follow as consult.  Patient with significant pain to both lower extremities -like contusions related to fall.  At baseline, patient ambulates with walker and lives alone.  Patient is significant fall risk after recent fall with right distal fibula fracture identified acutely.  Patient would benefit from admission for pain control, physical therapy evaluation.   Valarie Merino, MD 06/05/22 [phone]

## 2022-06-05 NOTE — Progress Notes (Signed)
Orthopedic Tech Progress Note Patient Details:  [name] 1923/08/03 [id number]  Tall CAM walker applied to RLE. Daughter is at bedside and also understands how to apply/remove/adjust the boot. Encouraged ice and elevation to help with pain/swelling.  Ortho Devices Type of Ortho Device: CAM walker Ortho Device/Splint Location: RLE Ortho Device/Splint Interventions: Ordered, Application, Adjustment   Post Interventions Patient Tolerated: Well Instructions Provided: Care of device, Adjustment of device  Irini Leet Jeri Modena 06/05/2022, 6:58 PM

## 2022-06-05 NOTE — Assessment & Plan Note (Addendum)
-  mildly elevated. Continue home CCB, beta-blocker, and diuretic

## 2022-06-05 NOTE — ED Notes (Signed)
ED TO INPATIENT HANDOFF REPORT  ED Nurse Name and Phone #: Janett Billow RN   S Name/Age/Gender [name] 89 y.o. female Room/Bed: WA12/WA12  Code Status   Code Status: Prior  Home/SNF/Other Home Patient oriented to: self, place, time, and situation Is this baseline? Yes   Triage Complete: Triage complete  Chief Complaint Right fibular fracture [S82.401A]  Triage Note Pt BIBA from home for knee pain and swelling, unable to bear weight. Mechanical fall last night forward onto both knees. Fire check out, was able to ambulate as normal with walker. This morning unable to walk. Bruising noted to right foot that got caught under the couch with fall. Sore left leg but able to pivot. Takes plavix. No head trauma, no LOC. 2 replacements to R knee, last in 2018. Last PO 1230  138/80 Hr 78 96% ra Cbg 197     Allergies No Known Allergies  Level of Care/Admitting Diagnosis ED Disposition     ED Disposition  Admit   Condition  --   Comment  Hospital Area: [facility] [[id number]]  Level of Care: Med-Surg [16]  May place patient in observation at [facility] or [facility] if equivalent level of care is available:: No  Covid Evaluation: Asymptomatic - no recent exposure (last 10 days) testing not required  Diagnosis: Right fibular fracture [id number]  Admitting Physician: Orene Desanctis [id number]  Attending Physician: Orene Desanctis [id number]          B Medical/Surgery History Past Medical History:  Diagnosis Date   Breast mass    Cervical spondylosis    Dilated cardiomyopathy ([place]) 05/2016   Mild LVH with EF of 20 1125%. Hypokinesis with inferior akinesis. GR 1 DD noted with elevated LVEDP. Moderate LA dilation. Moderate mitral regurgitation. Aortic valve aortic valve calcification/sclerosis without stenosis.   Hypertension    Past Surgical History:  Procedure Laterality Date   APPENDECTOMY     BACK SURGERY     x 4   CATARACT EXTRACTION     KNEE  SURGERY     x 2   TRANSTHORACIC ECHOCARDIOGRAM  05/2016   Mild LVH with EF of 20 1125%. Hypokinesis with inferior akinesis. GR 1 DD noted with elevated LVEDP. Moderate LA dilation. Moderate mitral regurgitation. Aortic valve aortic valve calcification/sclerosis without stenosis.   VENTRAL HERNIA REPAIR N/A 09/14/2013   Procedure: LAPAROSCOPIC ventral HERNIA REPAIR with mesh ;  Surgeon: Shann Medal, MD;  Location: [facility];  Service: General;  Laterality: N/A;     A IV Location/Drains/Wounds Patient Lines/Drains/Airways Status     Active Line/Drains/Airways     Name Placement date Placement time Site Days   Peripheral IV 06/05/22 22 G 1" Anterior;Left Forearm 06/05/22  1534  Forearm  less than 1   Incision - 3 Ports Abdomen 1: Right 2: Left;Upper 3: Left;Lower 09/14/13  --  -- 3186            Intake/Output Last 24 hours No intake or output data in the 24 hours ending 06/05/22 2113  Labs/Imaging Results for orders placed or performed during the hospital encounter of 06/05/22 (from the past 48 hour(s))  CBC with Differential     Status: Abnormal   Collection Time: 06/05/22  7:41 PM  Result Value Ref Range   WBC 10.1 4.0 - 10.5 K/uL   RBC 3.27 (L) 3.87 - 5.11 MIL/uL   Hemoglobin 10.2 (L) 12.0 - 15.0 g/dL   HCT 32.2 (L) 36.0 - 46.0 %  MCV 98.5 80.0 - 100.0 fL   MCH 31.2 26.0 - 34.0 pg   MCHC 31.7 30.0 - 36.0 g/dL   RDW 16.1 (H) 11.5 - 15.5 %   Platelets 117 (L) 150 - 400 K/uL   nRBC 0.0 0.0 - 0.2 %   Neutrophils Relative % 72 %   Neutro Abs 7.4 1.7 - 7.7 K/uL   Lymphocytes Relative 15 %   Lymphs Abs 1.5 0.7 - 4.0 K/uL   Monocytes Relative 9 %   Monocytes Absolute 0.9 0.1 - 1.0 K/uL   Eosinophils Relative 2 %   Eosinophils Absolute 0.2 0.0 - 0.5 K/uL   Basophils Relative 1 %   Basophils Absolute 0.1 0.0 - 0.1 K/uL   Immature Granulocytes 1 %   Abs Immature Granulocytes 0.05 0.00 - 0.07 K/uL    Comment: Performed at [facility], [place] [address]  [place], [place] [id number]  Basic metabolic panel     Status: Abnormal   Collection Time: 06/05/22  7:41 PM  Result Value Ref Range   Sodium 140 135 - 145 mmol/L   Potassium 3.8 3.5 - 5.1 mmol/L   Chloride 110 98 - 111 mmol/L   CO2 22 22 - 32 mmol/L   Glucose, Bld 98 70 - 99 mg/dL    Comment: Glucose reference range applies only to samples taken after fasting for at least 8 hours.   BUN 77 (H) 8 - 23 mg/dL   Creatinine, Ser 2.82 (H) 0.44 - 1.00 mg/dL   Calcium 8.6 (L) 8.9 - 10.3 mg/dL   GFR, Estimated 15 (L) >60 mL/min    Comment: (NOTE) Calculated using the CKD-EPI Creatinine Equation (2021)    Anion gap 8 5 - 15    Comment: Performed at [facility], [place] [address], [place], [place] [address]   DG Ankle Complete Right  Result Date: 06/05/2022 CLINICAL DATA:  Mechanical fall last night. Unable to walk. Right ankle swelling. EXAM: RIGHT ANKLE - COMPLETE 3+ VIEW COMPARISON:  None Available. FINDINGS: Minimally displaced oblique fracture of the distal fibula just above the ankle mortise. No additional fracture. The ankle mortise is preserved. No significant ankle joint effusion. Moderate soft tissue edema is noted laterally. IMPRESSION: Minimally displaced oblique distal fibular fracture. Associated soft tissue edema. Electronically Signed   By: Keith Rake M.D.   On: 06/05/2022 15:38   DG Foot Complete Left  Result Date: 06/05/2022 CLINICAL DATA:  Mechanical fall last night onto both knees. Unable to walk. EXAM: LEFT FOOT - COMPLETE 3+ VIEW COMPARISON:  None Available. FINDINGS: There is no evidence of fracture or dislocation. Pes planus. Scattered osteoarthritis. Moderate plantar calcaneal spur. Soft tissues are unremarkable. IMPRESSION: 1. No acute fracture or subluxation of the left foot. 2. Pes planus and osteoarthritis. Electronically Signed   By: Keith Rake M.D.   On: 06/05/2022 15:38   DG Femur Min 2 Views Right  Result Date: 06/05/2022 CLINICAL DATA:   Mechanical fall last night onto both knees. Unable to walk. EXAM: RIGHT FEMUR 2 VIEWS COMPARISON:  None Available. FINDINGS: No acute fracture. Revision knee arthroplasty is intact were visualized. Chronic appearing area of cortical irregularity involving the medial femoral condyle likely represents a remote fracture. Right hip osteoarthritis. There are vascular calcifications. IMPRESSION: 1. No acute fracture or dislocation of the right femur. 2. Revision right knee arthroplasty without complication. Right hip osteoarthritis. Electronically Signed   By: Keith Rake M.D.   On: 06/05/2022 15:37   DG  Pelvis 1-2 Views  Result Date: 06/05/2022 CLINICAL DATA:  Mechanical fall last night onto both knees. Unable to walk. EXAM: PELVIS - 1-2 VIEW COMPARISON:  None Available. FINDINGS: The cortical margins of the bony pelvis are intact. No fracture. Pubic symphysis and sacroiliac joints are congruent. Moderate sacroiliac joint degenerative change. Moderate bilateral hip osteoarthritis. Both femoral heads are well-seated in the respective acetabula. IMPRESSION: No fracture of the pelvis. Electronically Signed   By: Keith Rake M.D.   On: 06/05/2022 15:36   DG Tibia/Fibula Right  Result Date: 06/05/2022 CLINICAL DATA:  Mechanical fall last night. Unable to walk. EXAM: RIGHT TIBIA AND FIBULA - 2 VIEW COMPARISON:  None Available. FINDINGS: There is a minimally displaced distal fibular fracture just proximal to the ankle mortise. No mortise widening. No tibial fracture. Revision knee arthroplasty is intact were visualized. Subjective bony under mineralization. Soft tissue edema is seen about the fracture site. IMPRESSION: Acute minimally displaced distal fibular fracture just proximal to the ankle mortise. Electronically Signed   By: Keith Rake M.D.   On: 06/05/2022 15:35   DG Foot Complete Right  Result Date: 06/05/2022 CLINICAL DATA:  Mechanical fall last night. Right ankle swelling. Right foot  bruising. EXAM: RIGHT FOOT COMPLETE - 3+ VIEW COMPARISON:  None Available. FINDINGS: The bones are subjectively under mineralized. Distal fibular fracture is better assessed on concurrent ankle exam. No additional fracture of the foot. Mild osteoarthritis of the first metatarsal phalangeal joint. IMPRESSION: Distal fibular fracture, better assessed on concurrent ankle exam. No additional fracture of the foot. Electronically Signed   By: Keith Rake M.D.   On: 06/05/2022 15:34   DG Knee Complete 4 Views Left  Result Date: 06/05/2022 CLINICAL DATA:  Mechanical fall onto both knees. Unable to walk. EXAM: LEFT KNEE - COMPLETE 4+ VIEW COMPARISON:  None Available. FINDINGS: No evidence of fracture or dislocation. Diminutive knee joint effusion. The bones are subjectively under mineralized. There is mild osteoarthritis. There are prominent vascular calcifications. IMPRESSION: 1. No fracture or dislocation of the left knee. 2. Mild osteoarthritis and small joint effusion. 3. Vascular calcifications. Electronically Signed   By: Keith Rake M.D.   On: 06/05/2022 15:33   DG Knee Complete 4 Views Right  Result Date: 06/05/2022 CLINICAL DATA:  Mechanical fall onto both knees. Pain. Unable to walk. EXAM: RIGHT KNEE - COMPLETE 4+ VIEW COMPARISON:  None Available. FINDINGS: Revision knee arthroplasty in expected alignment. There is no periprosthetic lucency or fracture. Chronic appearing cortical irregularity about the medial femoral condyle likely represents remote fracture. There has been patellar resurfacing. There is a knee joint effusion. Prominent vascular calcifications are seen. IMPRESSION: 1. Revision knee arthroplasty without complication or acute fracture. 2. Knee joint effusion. 3. Chronic appearing cortical irregularity about the medial femoral condyle likely represents remote fracture. Electronically Signed   By: Keith Rake M.D.   On: 06/05/2022 15:32    Pending Labs Unresulted Labs (From  admission, onward)    None       Vitals/Pain Today's Vitals   06/05/22 1809 06/05/22 1930 06/05/22 2000 06/05/22 2030  BP:  (!) 174/71 (!) 152/79 (!) 147/80  Pulse:      Resp:  '17 12 13  '$ Temp:      TempSrc:      SpO2:      Height:      PainSc: 3        Isolation Precautions No active isolations  Medications Medications  HYDROcodone-acetaminophen (NORCO/VICODIN) 5-325 MG per tablet 1  tablet (has no administration in time range)  morphine (PF) 2 MG/ML injection 1 mg (has no administration in time range)  HYDROcodone-acetaminophen (NORCO/VICODIN) 5-325 MG per tablet 1 tablet (1 tablet Oral Given 06/05/22 1738)    Mobility non-ambulatory   Normally walks at home but now unable to due to injury  Focused Assessments \   R Recommendations: See Admitting Provider Note  Report given to:   Additional Notes:

## 2022-06-06 DIAGNOSIS — S82831A Other fracture of upper and lower end of right fibula, initial encounter for closed fracture: Secondary | ICD-10-CM | POA: Diagnosis not present

## 2022-06-06 LAB — BASIC METABOLIC PANEL
Anion gap: 7 (ref 5–15)
BUN: 76 mg/dL — ABNORMAL HIGH (ref 8–23)
CO2: 24 mmol/L (ref 22–32)
Calcium: 8.7 mg/dL — ABNORMAL LOW (ref 8.9–10.3)
Chloride: 108 mmol/L (ref 98–111)
Creatinine, Ser: 2.77 mg/dL — ABNORMAL HIGH (ref 0.44–1.00)
GFR, Estimated: 15 mL/min — ABNORMAL LOW (ref >60)
Glucose, Bld: 98 mg/dL (ref 70–99)
Potassium: 3.9 mmol/L (ref 3.5–5.1)
Sodium: 139 mmol/L (ref 135–145)

## 2022-06-06 LAB — CBC
HCT: 30.5 % — ABNORMAL LOW (ref 36.0–46.0)
Hemoglobin: 9.6 g/dL — ABNORMAL LOW (ref 12.0–15.0)
MCH: 30.9 pg (ref 26.0–34.0)
MCHC: 31.5 g/dL (ref 30.0–36.0)
MCV: 98.1 fL (ref 80.0–100.0)
Platelets: 116 10*3/uL — ABNORMAL LOW (ref 150–400)
RBC: 3.11 MIL/uL — ABNORMAL LOW (ref 3.87–5.11)
RDW: 16.2 % — ABNORMAL HIGH (ref 11.5–15.5)
WBC: 9.2 10*3/uL (ref 4.0–10.5)
nRBC: 0 % (ref 0.0–0.2)

## 2022-06-06 LAB — IRON AND TIBC
Iron: 23 ug/dL — ABNORMAL LOW (ref 28–170)
Saturation Ratios: 11 % (ref 10.4–31.8)
TIBC: 202 ug/dL — ABNORMAL LOW (ref 250–450)
UIBC: 179 ug/dL

## 2022-06-06 LAB — FOLATE: Folate: 18.9 ng/mL (ref >5.9)

## 2022-06-06 LAB — VITAMIN B12: Vitamin B-12: 583 pg/mL (ref 180–914)

## 2022-06-06 MED ORDER — SODIUM CHLORIDE 0.9 % IV SOLN
250.0000 mg | Freq: Every day | INTRAVENOUS | Status: AC
  Administered 2022-06-06 – 2022-06-07 (×2): 250 mg via INTRAVENOUS
  Filled 2022-06-06 (×2): qty 20

## 2022-06-06 NOTE — Consult Note (Signed)
ORTHOPAEDIC CONSULTATION  REQUESTING PHYSICIAN: Shelly Coss, MD  Chief Complaint: Right ankle pain  HPI: [name] is a 89 y.o. female who presents with with a right fibular fracture after a fall when she missed stepped with her walker.  She has otherwise a independent ambulator at an independent living facility.  She was having some left knee pain at the time of injury although this has resolved.  At this time her right ankle pain is the only real source of pain.  This is improving.  Past Medical History:  Diagnosis Date   Breast mass    Cervical spondylosis    Dilated cardiomyopathy ([place]) 05/2016   Mild LVH with EF of 20 1125%. Hypokinesis with inferior akinesis. GR 1 DD noted with elevated LVEDP. Moderate LA dilation. Moderate mitral regurgitation. Aortic valve aortic valve calcification/sclerosis without stenosis.   Hypertension    Past Surgical History:  Procedure Laterality Date   APPENDECTOMY     BACK SURGERY     x 4   CATARACT EXTRACTION     KNEE SURGERY     x 2   TRANSTHORACIC ECHOCARDIOGRAM  05/2016   Mild LVH with EF of 20 1125%. Hypokinesis with inferior akinesis. GR 1 DD noted with elevated LVEDP. Moderate LA dilation. Moderate mitral regurgitation. Aortic valve aortic valve calcification/sclerosis without stenosis.   VENTRAL HERNIA REPAIR N/A 09/14/2013   Procedure: LAPAROSCOPIC ventral HERNIA REPAIR with mesh ;  Surgeon: Shann Medal, MD;  Location: [facility];  Service: General;  Laterality: N/A;   Social History   Socioeconomic History   Marital status: Widowed    Spouse name: Not on file   Number of children: 2   Years of education: Not on file   Highest education level: Not on file  Occupational History   Not on file  Tobacco Use   Smoking status: Former    Types: Cigarettes   Smokeless tobacco: Never  Substance and Sexual Activity   Alcohol use: No   Drug use: No   Sexual activity: Not on file  Other Topics Concern   Not on file  Social  History Narrative   02/24/21 lives alone, dgtr 5 min away, cameras in house, caregivers   Social Determinants of Health   Financial Resource Strain: Not on file  Food Insecurity: No Food Insecurity (06/05/2022)   Hunger Vital Sign    Worried About Running Out of Food in the Last Year: Never true    Industry in the Last Year: Never true  Transportation Needs: No Transportation Needs (06/05/2022)   PRAPARE - Hydrologist (Medical): No    Lack of Transportation (Non-Medical): No  Physical Activity: Not on file  Stress: Not on file  Social Connections: Not on file   Family History  Problem Relation Age of Onset   Diabetes Mother    Hypertension Mother    Cancer Father        uknown kind of cancer   Diabetes Sister    - negative except otherwise stated in the family history section No Known Allergies Prior to Admission medications   Medication Sig Start Date End Date Taking? Authorizing Provider  acetaminophen (TYLENOL) 325 MG tablet Take 650 mg by mouth every 6 (six) hours as needed for mild pain.   Yes [provider]  allopurinol (ZYLOPRIM) 100 MG tablet Take 100 mg by mouth daily.  09/26/13  Yes [provider]  atorvastatin (LIPITOR) 40 MG  tablet Take 1 tablet (40 mg total) by mouth daily. 09/25/20  Yes Jennye Boroughs, MD  calcitRIOL (ROCALTROL) 0.5 MCG capsule Take 0.5 mcg by mouth daily.   Yes [provider]  carvedilol (COREG) 6.25 MG tablet Take 1 tablet (6.25 mg total) by mouth 2 (two) times daily with a meal. Please schedule an appointment for further refills 12/19/17  Yes Troy Sine, MD  clopidogrel (PLAVIX) 75 MG tablet Take 75 mg by mouth daily.   Yes [provider]  felodipine (PLENDIL) 10 MG 24 hr tablet Take 10 mg by mouth daily.   Yes [provider]  furosemide (LASIX) 40 MG tablet Take 0.5-1 tablets (20-40 mg total) by mouth 2 (two) times daily. Take 40 mg in the morning and 20 mg in  afternoon, may take extra 20 mg dose in the afternoon as needed 09/25/20  Yes Jennye Boroughs, MD  gabapentin (NEURONTIN) 300 MG capsule Take 300 mg by mouth at bedtime.   Yes [provider]  Multiple Vitamins-Minerals (PRESERVISION AREDS PO) Take 1 capsule by mouth daily.    Yes [provider]   DG Ankle Complete Right  Result Date: 06/05/2022 CLINICAL DATA:  Mechanical fall last night. Unable to walk. Right ankle swelling. EXAM: RIGHT ANKLE - COMPLETE 3+ VIEW COMPARISON:  None Available. FINDINGS: Minimally displaced oblique fracture of the distal fibula just above the ankle mortise. No additional fracture. The ankle mortise is preserved. No significant ankle joint effusion. Moderate soft tissue edema is noted laterally. IMPRESSION: Minimally displaced oblique distal fibular fracture. Associated soft tissue edema. Electronically Signed   By: Keith Rake M.D.   On: 06/05/2022 15:38   DG Foot Complete Left  Result Date: 06/05/2022 CLINICAL DATA:  Mechanical fall last night onto both knees. Unable to walk. EXAM: LEFT FOOT - COMPLETE 3+ VIEW COMPARISON:  None Available. FINDINGS: There is no evidence of fracture or dislocation. Pes planus. Scattered osteoarthritis. Moderate plantar calcaneal spur. Soft tissues are unremarkable. IMPRESSION: 1. No acute fracture or subluxation of the left foot. 2. Pes planus and osteoarthritis. Electronically Signed   By: Keith Rake M.D.   On: 06/05/2022 15:38   DG Femur Min 2 Views Right  Result Date: 06/05/2022 CLINICAL DATA:  Mechanical fall last night onto both knees. Unable to walk. EXAM: RIGHT FEMUR 2 VIEWS COMPARISON:  None Available. FINDINGS: No acute fracture. Revision knee arthroplasty is intact were visualized. Chronic appearing area of cortical irregularity involving the medial femoral condyle likely represents a remote fracture. Right hip osteoarthritis. There are vascular calcifications. IMPRESSION: 1. No acute fracture or  dislocation of the right femur. 2. Revision right knee arthroplasty without complication. Right hip osteoarthritis. Electronically Signed   By: Keith Rake M.D.   On: 06/05/2022 15:37   DG Pelvis 1-2 Views  Result Date: 06/05/2022 CLINICAL DATA:  Mechanical fall last night onto both knees. Unable to walk. EXAM: PELVIS - 1-2 VIEW COMPARISON:  None Available. FINDINGS: The cortical margins of the bony pelvis are intact. No fracture. Pubic symphysis and sacroiliac joints are congruent. Moderate sacroiliac joint degenerative change. Moderate bilateral hip osteoarthritis. Both femoral heads are well-seated in the respective acetabula. IMPRESSION: No fracture of the pelvis. Electronically Signed   By: Keith Rake M.D.   On: 06/05/2022 15:36   DG Tibia/Fibula Right  Result Date: 06/05/2022 CLINICAL DATA:  Mechanical fall last night. Unable to walk. EXAM: RIGHT TIBIA AND FIBULA - 2 VIEW COMPARISON:  None Available. FINDINGS: There is a minimally displaced  distal fibular fracture just proximal to the ankle mortise. No mortise widening. No tibial fracture. Revision knee arthroplasty is intact were visualized. Subjective bony under mineralization. Soft tissue edema is seen about the fracture site. IMPRESSION: Acute minimally displaced distal fibular fracture just proximal to the ankle mortise. Electronically Signed   By: Keith Rake M.D.   On: 06/05/2022 15:35   DG Foot Complete Right  Result Date: 06/05/2022 CLINICAL DATA:  Mechanical fall last night. Right ankle swelling. Right foot bruising. EXAM: RIGHT FOOT COMPLETE - 3+ VIEW COMPARISON:  None Available. FINDINGS: The bones are subjectively under mineralized. Distal fibular fracture is better assessed on concurrent ankle exam. No additional fracture of the foot. Mild osteoarthritis of the first metatarsal phalangeal joint. IMPRESSION: Distal fibular fracture, better assessed on concurrent ankle exam. No additional fracture of the foot.  Electronically Signed   By: Keith Rake M.D.   On: 06/05/2022 15:34   DG Knee Complete 4 Views Left  Result Date: 06/05/2022 CLINICAL DATA:  Mechanical fall onto both knees. Unable to walk. EXAM: LEFT KNEE - COMPLETE 4+ VIEW COMPARISON:  None Available. FINDINGS: No evidence of fracture or dislocation. Diminutive knee joint effusion. The bones are subjectively under mineralized. There is mild osteoarthritis. There are prominent vascular calcifications. IMPRESSION: 1. No fracture or dislocation of the left knee. 2. Mild osteoarthritis and small joint effusion. 3. Vascular calcifications. Electronically Signed   By: Keith Rake M.D.   On: 06/05/2022 15:33   DG Knee Complete 4 Views Right  Result Date: 06/05/2022 CLINICAL DATA:  Mechanical fall onto both knees. Pain. Unable to walk. EXAM: RIGHT KNEE - COMPLETE 4+ VIEW COMPARISON:  None Available. FINDINGS: Revision knee arthroplasty in expected alignment. There is no periprosthetic lucency or fracture. Chronic appearing cortical irregularity about the medial femoral condyle likely represents remote fracture. There has been patellar resurfacing. There is a knee joint effusion. Prominent vascular calcifications are seen. IMPRESSION: 1. Revision knee arthroplasty without complication or acute fracture. 2. Knee joint effusion. 3. Chronic appearing cortical irregularity about the medial femoral condyle likely represents remote fracture. Electronically Signed   By: Keith Rake M.D.   On: 06/05/2022 15:32     Positive ROS: All other systems have been reviewed and were otherwise negative with the exception of those mentioned in the HPI and as above.  Physical Exam: General: No acute distress Cardiovascular: No pedal edema Respiratory: No cyanosis, no use of accessory musculature GI: No organomegaly, abdomen is soft and non-tender Skin: No lesions in the area of chief complaint Neurologic: Sensation intact distally Psychiatric: Patient is at  baseline mood and affect Lymphatic: No axillary or cervical lymphadenopathy  MUSCULOSKELETAL:  Right ankle tenderness to palpation about the fibula.  No deformity.  Mild swelling.  Sensations intact on distributions.  Fires all motor groups.  2+ dorsalis pedis pulse.  Cam boot in place  Independent Imaging Review: Right ankle 3 views, left knee 3 views: Nondisplaced Weber B fibula fracture  Assessment: With a nondisplaced Weber B fibula fracture which is stable in nature.  To this fact she may be up and mobilize.  She can be weightbearing as tolerated with her cam boot walker.  Would recommend physical therapy to assist her with ambulation in the boot.  She may follow-up with me on discharge from the hospital  Plan: She may follow-up with me on discharge from the hospital, no operative intervention required given stability of the fracture  Thank you for the consult and the opportunity to  see Ms. Etta Quill, MD [facility] 8:19 AM

## 2022-06-06 NOTE — Progress Notes (Signed)
Physical Therapy Treatment Patient Details Name: [name] MRN: [id number] DOB: July 29, 1923 Today's Date: 06/06/2022   History of Present Illness Pt s/p fall with R displaced fibula fx and hx of CVA, CKD, CHF, back surgery x 4, cervical spondylosis, and dilated cardiomyopathy    PT Comments    Pt continues very cooperative but continues to require significant assist for mobility and would benefit from follow up rehab at SNF level to maximize IND and safety prior to return home with limited assist.   Recommendations for follow up therapy are one component of a multi-disciplinary discharge planning process, led by the attending physician.  Recommendations may be updated based on patient status, additional functional criteria and insurance authorization.  Follow Up Recommendations  Skilled nursing-short term rehab (<3 hours/day) Can patient physically be transported by private vehicle: Yes   Assistance Recommended at Discharge Frequent or constant Supervision/Assistance  Patient can return home with the following A lot of help with walking and/or transfers;Assistance with cooking/housework;A little help with bathing/dressing/bathroom;Assist for transportation;Help with stairs or ramp for entrance   Equipment Recommendations  None recommended by PT    Recommendations for Other Services       Precautions / Restrictions Precautions Precautions: Fall Restrictions Weight Bearing Restrictions: Yes RLE Weight Bearing: Weight bearing as tolerated (WBAT with cam boot)     Mobility  Bed Mobility Overal bed mobility: Needs Assistance Bed Mobility: Sit to Supine     Supine to sit: Min assist Sit to supine: Mod assist   General bed mobility comments: INcreased time with assist to manage LEs onto bed    Transfers Overall transfer level: Needs assistance Equipment used: Rolling walker (2 wheels) Transfers: Sit to/from Stand, Bed to chair/wheelchair/BSC Sit to Stand: Mod assist    Step pivot transfers: Mod assist, +2 safety/equipment, From elevated surface       General transfer comment: Cues for LE management and use of UEs to self assist.  Physical assist to bring wt up and fwd, to block L foot from skiing out and to balance in standing with RW.  Step pvt recliner to [facility]    Ambulation/Gait Ambulation/Gait assistance: Min assist, Mod assist Gait Distance (Feet): 30 Feet Assistive device: Rolling walker (2 wheels) Gait Pattern/deviations: Step-to pattern, Decreased step length - right, Decreased step length - left, Shuffle, Trunk flexed Gait velocity: decr     General Gait Details: Increased time with cues for sequence, posture and position from [employer]             Wheelchair Mobility    Modified Rankin (Stroke Patients Only)       Balance Overall balance assessment: Needs assistance Sitting-balance support: No upper extremity supported, Feet supported Sitting balance-Leahy Scale: Good     Standing balance support: Bilateral upper extremity supported Standing balance-Leahy Scale: Poor                              Cognition Arousal/Alertness: Awake/alert Behavior During Therapy: WFL for tasks assessed/performed Overall Cognitive Status: Within Functional Limits for tasks assessed                                          Exercises      General Comments        Pertinent Vitals/Pain Pain Assessment Pain Assessment: No/denies pain Pain Score:  2  Pain Location: R foot/ankle Pain Descriptors / Indicators: Aching, Sore Pain Intervention(s): Limited activity within patient's tolerance, Monitored during session, Premedicated before session    Home Living Family/patient expects to be discharged to:: Skilled nursing facility Living Arrangements: Alone Available Help at Discharge: Family;Available PRN/intermittently Type of Home: House Home Access: Level entry       Home Layout: One level Home  Equipment: Conservation officer, nature (2 wheels);BSC/3in1;Toilet riser;Grab bars - tub/shower Additional Comments: Pt has PCA several hours a day    Prior Function            PT Goals (current goals can now be found in the care plan section) Acute Rehab PT Goals Patient Stated Goal: REgain IND PT Goal Formulation: With patient Time For Goal Achievement: 06/20/22 Potential to Achieve Goals: Fair Progress towards PT goals: Progressing toward goals    Frequency    Min 3X/week      PT Plan Current plan remains appropriate    Co-evaluation              AM-PAC PT "6 Clicks" Mobility   Outcome Measure  Help needed turning from your back to your side while in a flat bed without using bedrails?: A Little Help needed moving from lying on your back to sitting on the side of a flat bed without using bedrails?: A Little Help needed moving to and from a bed to a chair (including a wheelchair)?: A Lot Help needed standing up from a chair using your arms (e.g., wheelchair or bedside chair)?: A Lot Help needed to walk in hospital room?: A Lot Help needed climbing 3-5 steps with a railing? : Total 6 Click Score: 13    End of Session Equipment Utilized During Treatment: Gait belt Activity Tolerance: Patient tolerated treatment well Patient left: in chair;with call bell/phone within reach;with chair alarm set;with family/visitor present Nurse Communication: Mobility status PT Visit Diagnosis: History of falling (Z91.81);Pain;Difficulty in walking, not elsewhere classified (R26.2) Pain - Right/Left: Right Pain - part of body: Ankle and joints of foot     Time: 1330-1355 PT Time Calculation (min) (ACUTE ONLY): 25 min  Charges:  $Gait Training: 8-22 mins $Therapeutic Activity: 8-22 mins                     Debe Coder PT Acute Rehabilitation Services Pager [phone] Office [phone]    Lark Runk 06/06/2022, 2:11 PM

## 2022-06-06 NOTE — Discharge Instructions (Signed)
     Discharge Instructions    Attending Surgeon: Vanetta Mulders, MD Office Phone Number: [phone]   Diagnosis    Surgeries Performed: Right fibula fracture  Discharge Plan:     Activity:  Weightbearing as tolerated in a cam boot walker.

## 2022-06-06 NOTE — Progress Notes (Addendum)
PROGRESS NOTE  [name]  [id number] DOB: 1924/04/12 DOA: 06/05/2022 PCP: Jilda Panda, MD   Brief Narrative: Patient is a 89 year old female with history of hypertension, CKD stage IV, combined systolic/diastolic CHF, CVA with bilateral carotid artery stenosis who presented with mechanical fall resulting in right knee pain.  At baseline, patient ambulates with the help of walker.  She slipped on the kitchen with her socks on.  No history of recurrent falls.  On presentation, she was hypertensive.  Lab work showed hemoglobin of 10.2, creatinine of 2.8.  X-ray showed acute displaced right fibular fracture proximal to the ankle mortise.  Orthopedics consulted who recommended cam boot and weightbearing as tolerated,outpatient follow up.  PT/OT  evaluation pending.  Assessment & Plan:  Principal Problem:   Right fibular fracture Active Problems:   CKD (chronic kidney disease) stage 4, GFR 15-29 ml/min (HCC)   Anemia   Combined systolic and diastolic congestive heart failure, NYHA class 3 (HCC)   History of CVA (cerebrovascular accident)   HTN (hypertension)   Right fibular fracture: Fell at home.  X-ray showed acute displaced right fibular fracture proximal to the ankle mortise.  Orthopedics consulted who recommended cam boot and weightbearing as tolerated.  PT/OT  evaluation pending.  Might need to SNF.  Continue pain management, supportive care. She lives alone,uses walker at home.daughter interested for SnF  Hypertension: Continue monitor blood pressure.  Continue home calcium channel blockers, beta-blocker, diuretic therapy  History of CVA: On Plavix and statin  Combined  systolic/diastolic congestive heart failure: Last echo done on 6/22 showed EF of 30 to [id number], grade 2 diastolic function, moderate mitral valve regurgitation.  Euvolemic on exam.  Continue beta-blocker, Lasix  Normocytic anemia: Currently hemoglobin stable.  Iron panel showed low iron, will give her IV iron,  continue oral supplementation on discharge.  CKD stage IV: Stable, creatinine at baseline.  We recommend follow-up with nephrology as an outpatient.        DVT prophylaxis:enoxaparin (LOVENOX) injection 30 mg Start: 06/06/22 1000     Code Status: DNR  Family Communication: called and discussed with daughter on phone  on 2/25  Patient status:Obs  Patient is from :Home  Anticipated discharge [id number] vs SnF  Estimated DC date:1-2 days   Consultants: Orthopedics  Procedures:None  Antimicrobials:  Anti-infectives (From admission, onward)    None       Subjective: Patient seen and examined at bedside today.  Hemodynamically stable.  Lying in bed.  She said that her pain is not that bad.  Alert and oriented, comfortable.  Denies any abdominal pain, nausea vomiting or shortness of breath  Objective: Vitals:   06/05/22 2130 06/05/22 2230 06/05/22 2255 06/06/22 0518  BP: (!) 142/65  (!) 152/72 (!) 162/78  Pulse: 67  68 73  Resp: '13  18 16  '$ Temp:   (!) 97.5 F (36.4 C) 97.8 F (36.6 C)  TempSrc:   Oral Oral  SpO2: 98%  (!) 88% 100%  Weight:  63 kg    Height:  '5\' 3"'$  (1.6 m)      Intake/Output Summary (Last 24 hours) at 06/06/2022 0720 Last data filed at 06/06/2022 0600 Gross per 24 hour  Intake 120 ml  Output 400 ml  Net -280 ml   Filed Weights   06/05/22 2230  Weight: 63 kg    Examination:  General exam: Overall comfortable, not in distress HEENT: PERRL Respiratory system:  no wheezes or crackles  Cardiovascular system: S1 & S2 heard, RRR.  Gastrointestinal system: Abdomen is nondistended, soft and nontender. Central nervous system: Alert and oriented Extremities: No edema, no clubbing ,no cyanosis,cam boot on the RLE Skin: No rashes, no ulcers,no icterus     Data Reviewed: I have personally reviewed following labs and imaging studies  CBC: Recent Labs  Lab 06/05/22 1941 06/06/22 0313  WBC 10.1 9.2  NEUTROABS 7.4  --   HGB 10.2* 9.6*  HCT  32.2* 30.5*  MCV 98.5 98.1  PLT 117* [id number]*   Basic Metabolic Panel: Recent Labs  Lab 06/05/22 1941 06/06/22 0313  NA 140 139  K 3.8 3.9  CL 110 108  CO2 22 24  GLUCOSE 98 98  BUN 77* 76*  CREATININE 2.82* 2.77*  CALCIUM 8.6* 8.7*     No results found for this or any previous visit (from the past 240 hour(s)).   Radiology Studies: DG Ankle Complete Right  Result Date: 06/05/2022 CLINICAL DATA:  Mechanical fall last night. Unable to walk. Right ankle swelling. EXAM: RIGHT ANKLE - COMPLETE 3+ VIEW COMPARISON:  None Available. FINDINGS: Minimally displaced oblique fracture of the distal fibula just above the ankle mortise. No additional fracture. The ankle mortise is preserved. No significant ankle joint effusion. Moderate soft tissue edema is noted laterally. IMPRESSION: Minimally displaced oblique distal fibular fracture. Associated soft tissue edema. Electronically Signed   By: Keith Rake M.D.   On: 06/05/2022 15:38   DG Foot Complete Left  Result Date: 06/05/2022 CLINICAL DATA:  Mechanical fall last night onto both knees. Unable to walk. EXAM: LEFT FOOT - COMPLETE 3+ VIEW COMPARISON:  None Available. FINDINGS: There is no evidence of fracture or dislocation. Pes planus. Scattered osteoarthritis. Moderate plantar calcaneal spur. Soft tissues are unremarkable. IMPRESSION: 1. No acute fracture or subluxation of the left foot. 2. Pes planus and osteoarthritis. Electronically Signed   By: Keith Rake M.D.   On: 06/05/2022 15:38   DG Femur Min 2 Views Right  Result Date: 06/05/2022 CLINICAL DATA:  Mechanical fall last night onto both knees. Unable to walk. EXAM: RIGHT FEMUR 2 VIEWS COMPARISON:  None Available. FINDINGS: No acute fracture. Revision knee arthroplasty is intact were visualized. Chronic appearing area of cortical irregularity involving the medial femoral condyle likely represents a remote fracture. Right hip osteoarthritis. There are vascular calcifications.  IMPRESSION: 1. No acute fracture or dislocation of the right femur. 2. Revision right knee arthroplasty without complication. Right hip osteoarthritis. Electronically Signed   By: Keith Rake M.D.   On: 06/05/2022 15:37   DG Pelvis 1-2 Views  Result Date: 06/05/2022 CLINICAL DATA:  Mechanical fall last night onto both knees. Unable to walk. EXAM: PELVIS - 1-2 VIEW COMPARISON:  None Available. FINDINGS: The cortical margins of the bony pelvis are intact. No fracture. Pubic symphysis and sacroiliac joints are congruent. Moderate sacroiliac joint degenerative change. Moderate bilateral hip osteoarthritis. Both femoral heads are well-seated in the respective acetabula. IMPRESSION: No fracture of the pelvis. Electronically Signed   By: Keith Rake M.D.   On: 06/05/2022 15:36   DG Tibia/Fibula Right  Result Date: 06/05/2022 CLINICAL DATA:  Mechanical fall last night. Unable to walk. EXAM: RIGHT TIBIA AND FIBULA - 2 VIEW COMPARISON:  None Available. FINDINGS: There is a minimally displaced distal fibular fracture just proximal to the ankle mortise. No mortise widening. No tibial fracture. Revision knee arthroplasty is intact were visualized. Subjective bony under mineralization. Soft tissue edema is seen about the fracture site. IMPRESSION: Acute minimally displaced distal fibular fracture just proximal  to the ankle mortise. Electronically Signed   By: Keith Rake M.D.   On: 06/05/2022 15:35   DG Foot Complete Right  Result Date: 06/05/2022 CLINICAL DATA:  Mechanical fall last night. Right ankle swelling. Right foot bruising. EXAM: RIGHT FOOT COMPLETE - 3+ VIEW COMPARISON:  None Available. FINDINGS: The bones are subjectively under mineralized. Distal fibular fracture is better assessed on concurrent ankle exam. No additional fracture of the foot. Mild osteoarthritis of the first metatarsal phalangeal joint. IMPRESSION: Distal fibular fracture, better assessed on concurrent ankle exam. No  additional fracture of the foot. Electronically Signed   By: Keith Rake M.D.   On: 06/05/2022 15:34   DG Knee Complete 4 Views Left  Result Date: 06/05/2022 CLINICAL DATA:  Mechanical fall onto both knees. Unable to walk. EXAM: LEFT KNEE - COMPLETE 4+ VIEW COMPARISON:  None Available. FINDINGS: No evidence of fracture or dislocation. Diminutive knee joint effusion. The bones are subjectively under mineralized. There is mild osteoarthritis. There are prominent vascular calcifications. IMPRESSION: 1. No fracture or dislocation of the left knee. 2. Mild osteoarthritis and small joint effusion. 3. Vascular calcifications. Electronically Signed   By: Keith Rake M.D.   On: 06/05/2022 15:33   DG Knee Complete 4 Views Right  Result Date: 06/05/2022 CLINICAL DATA:  Mechanical fall onto both knees. Pain. Unable to walk. EXAM: RIGHT KNEE - COMPLETE 4+ VIEW COMPARISON:  None Available. FINDINGS: Revision knee arthroplasty in expected alignment. There is no periprosthetic lucency or fracture. Chronic appearing cortical irregularity about the medial femoral condyle likely represents remote fracture. There has been patellar resurfacing. There is a knee joint effusion. Prominent vascular calcifications are seen. IMPRESSION: 1. Revision knee arthroplasty without complication or acute fracture. 2. Knee joint effusion. 3. Chronic appearing cortical irregularity about the medial femoral condyle likely represents remote fracture. Electronically Signed   By: Keith Rake M.D.   On: 06/05/2022 15:32    Scheduled Meds:  allopurinol  100 mg Oral Daily   atorvastatin  40 mg Oral Daily   calcitRIOL  0.5 mcg Oral Daily   carvedilol  6.25 mg Oral BID WC   clopidogrel  75 mg Oral Daily   enoxaparin (LOVENOX) injection  30 mg Subcutaneous Q24H   felodipine  10 mg Oral Daily   furosemide  20 mg Oral Q1400   furosemide  40 mg Oral QAC breakfast   gabapentin  300 mg Oral QHS   Continuous Infusions:   LOS: 0  days   Shelly Coss, MD [facility] P2/25/2024, 7:20 AM

## 2022-06-06 NOTE — Evaluation (Signed)
Physical Therapy Evaluation Patient Details Name: [name] MRN: [id number] DOB: [birth year] Today's Date: 06/06/2022  History of Present Illness  Pt s/p fall with R displaced fibula fx and hx of CVA, CKD, CHF, back surgery x 4, cervical spondylosis, and dilated cardiomyopathy  Clinical Impression  Pt admitted as above and presenting with functional mobility limitations 2* generalized weakness, R LE pain, and significant balance deficits.  Pt would benefit from follow rehab at SNF level to maximize IND and safety prior to return home with PRN assist.     Recommendations for follow up therapy are one component of a multi-disciplinary discharge planning process, led by the attending physician.  Recommendations may be updated based on patient status, additional functional criteria and insurance authorization.  Follow Up Recommendations Skilled nursing-short term rehab (<3 hours/day) Can patient physically be transported by private vehicle: Yes    Assistance Recommended at Discharge Frequent or constant Supervision/Assistance  Patient can return home with the following  A lot of help with walking and/or transfers;Assistance with cooking/housework;A little help with bathing/dressing/bathroom;Assist for transportation;Help with stairs or ramp for entrance    Equipment Recommendations None recommended by PT  Recommendations for Other Services       Functional Status Assessment Patient has had a recent decline in their functional status and demonstrates the ability to make significant improvements in function in a reasonable and predictable amount of time.     Precautions / Restrictions Precautions Precautions: Fall Restrictions Weight Bearing Restrictions: Yes RLE Weight Bearing: Weight bearing as tolerated (with CAM boot)      Mobility  Bed Mobility Overal bed mobility: Needs Assistance Bed Mobility: Supine to Sit     Supine to sit: Min assist     General bed mobility  comments: Increased time with assist to complete rotation to EOB sitting    Transfers Overall transfer level: Needs assistance Equipment used: Rolling walker (2 wheels) Transfers: Sit to/from Stand, Bed to chair/wheelchair/BSC Sit to Stand: Mod assist   Step pivot transfers: Mod assist, +2 safety/equipment, From elevated surface       General transfer comment: Cues for LE management and use of UEs to self assist.  Physical assist to bring wt up and fwd and to balance in standing with RW.  Step pvt bed to [facility]    Ambulation/Gait Ambulation/Gait assistance: Min assist, Mod assist Gait Distance (Feet): 38 Feet Assistive device: Rolling walker (2 wheels) Gait Pattern/deviations: Step-to pattern, Decreased step length - right, Decreased step length - left, Shuffle, Trunk flexed Gait velocity: decr     General Gait Details: Increased time with cues for sequence, posture and position from [employer]            Wheelchair Mobility    Modified Rankin (Stroke Patients Only)       Balance Overall balance assessment: Needs assistance Sitting-balance support: No upper extremity supported, Feet supported Sitting balance-Leahy Scale: Good     Standing balance support: Bilateral upper extremity supported Standing balance-Leahy Scale: Poor                               Pertinent Vitals/Pain Pain Assessment Pain Assessment: 0-10 Pain Score: 2  Pain Location: R foot/ankle Pain Descriptors / Indicators: Aching, Sore Pain Intervention(s): Limited activity within patient's tolerance, Monitored during session, Premedicated before session    Home Living Family/patient expects to be discharged to:: Skilled nursing facility Living Arrangements: Alone Available Help at Discharge:  Family;Available PRN/intermittently Type of Home: House Home Access: Level entry       Home Layout: One level Home Equipment: Conservation officer, nature (2 wheels);BSC/3in1;Toilet riser;Grab bars -  tub/shower Additional Comments: Pt has PCA several hours a day    Prior Function Prior Level of Function : Needs assist             Mobility Comments: RW, cane as needed ADLs Comments: Assist of PCA and family as needed     Hand Dominance   Dominant Hand: Right    Extremity/Trunk Assessment   Upper Extremity Assessment Upper Extremity Assessment: Generalized weakness    Lower Extremity Assessment Lower Extremity Assessment: Generalized weakness;RLE deficits/detail RLE Deficits / Details: cam boot in place    Cervical / Trunk Assessment Cervical / Trunk Assessment: Kyphotic  Communication   Communication: No difficulties  Cognition Arousal/Alertness: Awake/alert Behavior During Therapy: WFL for tasks assessed/performed Overall Cognitive Status: Within Functional Limits for tasks assessed                                          General Comments      Exercises     Assessment/Plan    PT Assessment Patient needs continued PT services  PT Problem List Decreased strength;Decreased range of motion;Decreased activity tolerance;Decreased balance;Decreased mobility;Decreased knowledge of use of DME;Pain       PT Treatment Interventions DME instruction;Gait training;Functional mobility training;Therapeutic activities;Therapeutic exercise;Balance training;Patient/family education    PT Goals (Current goals can be found in the Care Plan section)  Acute Rehab PT Goals Patient Stated Goal: REgain IND PT Goal Formulation: With patient Time For Goal Achievement: 06/20/22 Potential to Achieve Goals: Fair    Frequency Min 3X/week     Co-evaluation               AM-PAC PT "6 Clicks" Mobility  Outcome Measure Help needed turning from your back to your side while in a flat bed without using bedrails?: A Little Help needed moving from lying on your back to sitting on the side of a flat bed without using bedrails?: A Little Help needed moving to  and from a bed to a chair (including a wheelchair)?: A Lot Help needed standing up from a chair using your arms (e.g., wheelchair or bedside chair)?: A Lot Help needed to walk in hospital room?: A Lot Help needed climbing 3-5 steps with a railing? : Total 6 Click Score: 13    End of Session Equipment Utilized During Treatment: Gait belt Activity Tolerance: Patient tolerated treatment well Patient left: in chair;with call bell/phone within reach;with chair alarm set;with family/visitor present Nurse Communication: Mobility status PT Visit Diagnosis: History of falling (Z91.81);Pain;Difficulty in walking, not elsewhere classified (R26.2) Pain - Right/Left: Right Pain - part of body: Ankle and joints of foot    Time: [id number] PT Time Calculation (min) (ACUTE ONLY): 27 min   Charges:   PT Evaluation $PT Eval Low Complexity: 1 Low PT Treatments $Gait Training: 8-22 mins        Debe Coder PT Acute Rehabilitation Services Pager [phone] Office [phone]   Latasha Puskas 06/06/2022, 2:05 PM

## 2022-06-07 DIAGNOSIS — S82831A Other fracture of upper and lower end of right fibula, initial encounter for closed fracture: Secondary | ICD-10-CM | POA: Diagnosis not present

## 2022-06-07 MED ORDER — TRAMADOL HCL 50 MG PO TABS
50.0000 mg | ORAL_TABLET | Freq: Three times a day (TID) | ORAL | Status: DC | PRN
  Administered 2022-06-07 – 2022-06-08 (×2): 50 mg via ORAL
  Filled 2022-06-07 (×2): qty 1

## 2022-06-07 NOTE — Progress Notes (Signed)
PROGRESS NOTE  [name]  [id number] DOB: [birth year] DOA: 06/05/2022 PCP: Jilda Panda, MD   Brief Narrative: Patient is a 89 year old female with history of hypertension, CKD stage IV, combined systolic/diastolic CHF, CVA with bilateral carotid artery stenosis who presented with mechanical fall resulting in right knee pain.  At baseline, patient ambulates with the help of walker.  She slipped on the kitchen with her socks on.  No history of recurrent falls.  On presentation, she was hypertensive.  Lab work showed hemoglobin of 10.2, creatinine of 2.8.  X-ray showed acute displaced right fibular fracture proximal to the ankle mortise.  Orthopedics consulted who recommended cam boot and weightbearing as tolerated,outpatient follow up.  PT/OT  evaluation done, SNF recommended.  Medically stable for discharge to SNF whenever possible..  Assessment & Plan:  Principal Problem:   Right fibular fracture Active Problems:   CKD (chronic kidney disease) stage 4, GFR 15-29 ml/min (HCC)   Anemia   Combined systolic and diastolic congestive heart failure, NYHA class 3 (HCC)   History of CVA (cerebrovascular accident)   HTN (hypertension)   Right fibular fracture: Fell at home.  X-ray showed acute displaced right fibular fracture proximal to the ankle mortise.  Orthopedics consulted who recommended cam boot and weightbearing as tolerated.  PT/OT  evaluation done,recommended  SNF.  Continue pain management, supportive care. She lives alone,uses walker at home.daughter interested for SnF  Hypertension: Continue monitor blood pressure.  Continue home calcium channel blockers, beta-blocker, diuretic therapy  History of CVA: On Plavix and statin  Combined  systolic/diastolic congestive heart failure: Last echo done on 6/22 showed EF of 30 to [id number], grade 2 diastolic function, moderate mitral valve regurgitation.  Euvolemic on exam.  Continue beta-blocker, Lasix  Normocytic anemia: Currently  hemoglobin stable.  Iron panel showed low iron, given her IV iron, continue oral supplementation on discharge.  CKD stage IV: Stable, creatinine at baseline.  We recommend follow-up with nephrology as an outpatient.        DVT prophylaxis:enoxaparin (LOVENOX) injection 30 mg Start: 06/06/22 1000     Code Status: DNR  Family Communication: called and discussed with daughter on phone  on 2/25  Patient status:Obs  Patient is from :Home  Anticipated discharge to:SnF  Estimated DC date:1-2 days   Consultants: Orthopedics  Procedures:None  Antimicrobials:  Anti-infectives (From admission, onward)    None       Subjective: Patient seen and examined at bedside today.  Hemodynamically stable.  Remains alert oriented.  Pain well-controlled.  Denies any complaints today  Objective: Vitals:   06/06/22 1412 06/06/22 2127 06/07/22 0506 06/07/22 1000  BP: (!) 89/49 (!) 145/70 (!) 156/72 (!) 125/56  Pulse: 69 73 70 69  Resp: 16 18    Temp: 97.6 F (36.4 C) 98.2 F (36.8 C) 98.2 F (36.8 C)   TempSrc: Oral Oral Oral   SpO2: 100% 99% 94%   Weight:      Height:        Intake/Output Summary (Last 24 hours) at 06/07/2022 1327 Last data filed at 06/07/2022 0500 Gross per 24 hour  Intake 670 ml  Output 400 ml  Net 270 ml   Filed Weights   06/05/22 2230  Weight: 63 kg    Examination:   General exam: Overall comfortable, not in distress HEENT: PERRL Respiratory system:  no wheezes or crackles  Cardiovascular system: S1 & S2 heard, RRR.  Gastrointestinal system: Abdomen is nondistended, soft and nontender. Central nervous system: Alert and  oriented Extremities: No edema, no clubbing ,no cyanosis,cam boot on right lower extremity Skin: No rashes, no ulcers,no icterus      Data Reviewed: I have personally reviewed following labs and imaging studies  CBC: Recent Labs  Lab 06/05/22 1941 06/06/22 0313  WBC 10.1 9.2  NEUTROABS 7.4  --   HGB 10.2* 9.6*  HCT  32.2* 30.5*  MCV 98.5 98.1  PLT 117* [id number]*   Basic Metabolic Panel: Recent Labs  Lab 06/05/22 1941 06/06/22 0313  NA 140 139  K 3.8 3.9  CL 110 108  CO2 22 24  GLUCOSE 98 98  BUN 77* 76*  CREATININE 2.82* 2.77*  CALCIUM 8.6* 8.7*     No results found for this or any previous visit (from the past 240 hour(s)).   Radiology Studies: DG Ankle Complete Right  Result Date: 06/05/2022 CLINICAL DATA:  Mechanical fall last night. Unable to walk. Right ankle swelling. EXAM: RIGHT ANKLE - COMPLETE 3+ VIEW COMPARISON:  None Available. FINDINGS: Minimally displaced oblique fracture of the distal fibula just above the ankle mortise. No additional fracture. The ankle mortise is preserved. No significant ankle joint effusion. Moderate soft tissue edema is noted laterally. IMPRESSION: Minimally displaced oblique distal fibular fracture. Associated soft tissue edema. Electronically Signed   By: Keith Rake M.D.   On: 06/05/2022 15:38   DG Foot Complete Left  Result Date: 06/05/2022 CLINICAL DATA:  Mechanical fall last night onto both knees. Unable to walk. EXAM: LEFT FOOT - COMPLETE 3+ VIEW COMPARISON:  None Available. FINDINGS: There is no evidence of fracture or dislocation. Pes planus. Scattered osteoarthritis. Moderate plantar calcaneal spur. Soft tissues are unremarkable. IMPRESSION: 1. No acute fracture or subluxation of the left foot. 2. Pes planus and osteoarthritis. Electronically Signed   By: Keith Rake M.D.   On: 06/05/2022 15:38   DG Femur Min 2 Views Right  Result Date: 06/05/2022 CLINICAL DATA:  Mechanical fall last night onto both knees. Unable to walk. EXAM: RIGHT FEMUR 2 VIEWS COMPARISON:  None Available. FINDINGS: No acute fracture. Revision knee arthroplasty is intact were visualized. Chronic appearing area of cortical irregularity involving the medial femoral condyle likely represents a remote fracture. Right hip osteoarthritis. There are vascular calcifications.  IMPRESSION: 1. No acute fracture or dislocation of the right femur. 2. Revision right knee arthroplasty without complication. Right hip osteoarthritis. Electronically Signed   By: Keith Rake M.D.   On: 06/05/2022 15:37   DG Pelvis 1-2 Views  Result Date: 06/05/2022 CLINICAL DATA:  Mechanical fall last night onto both knees. Unable to walk. EXAM: PELVIS - 1-2 VIEW COMPARISON:  None Available. FINDINGS: The cortical margins of the bony pelvis are intact. No fracture. Pubic symphysis and sacroiliac joints are congruent. Moderate sacroiliac joint degenerative change. Moderate bilateral hip osteoarthritis. Both femoral heads are well-seated in the respective acetabula. IMPRESSION: No fracture of the pelvis. Electronically Signed   By: Keith Rake M.D.   On: 06/05/2022 15:36   DG Tibia/Fibula Right  Result Date: 06/05/2022 CLINICAL DATA:  Mechanical fall last night. Unable to walk. EXAM: RIGHT TIBIA AND FIBULA - 2 VIEW COMPARISON:  None Available. FINDINGS: There is a minimally displaced distal fibular fracture just proximal to the ankle mortise. No mortise widening. No tibial fracture. Revision knee arthroplasty is intact were visualized. Subjective bony under mineralization. Soft tissue edema is seen about the fracture site. IMPRESSION: Acute minimally displaced distal fibular fracture just proximal to the ankle mortise. Electronically Signed   By: Threasa Beards  Sanford M.D.   On: 06/05/2022 15:35   DG Foot Complete Right  Result Date: 06/05/2022 CLINICAL DATA:  Mechanical fall last night. Right ankle swelling. Right foot bruising. EXAM: RIGHT FOOT COMPLETE - 3+ VIEW COMPARISON:  None Available. FINDINGS: The bones are subjectively under mineralized. Distal fibular fracture is better assessed on concurrent ankle exam. No additional fracture of the foot. Mild osteoarthritis of the first metatarsal phalangeal joint. IMPRESSION: Distal fibular fracture, better assessed on concurrent ankle exam. No  additional fracture of the foot. Electronically Signed   By: Keith Rake M.D.   On: 06/05/2022 15:34   DG Knee Complete 4 Views Left  Result Date: 06/05/2022 CLINICAL DATA:  Mechanical fall onto both knees. Unable to walk. EXAM: LEFT KNEE - COMPLETE 4+ VIEW COMPARISON:  None Available. FINDINGS: No evidence of fracture or dislocation. Diminutive knee joint effusion. The bones are subjectively under mineralized. There is mild osteoarthritis. There are prominent vascular calcifications. IMPRESSION: 1. No fracture or dislocation of the left knee. 2. Mild osteoarthritis and small joint effusion. 3. Vascular calcifications. Electronically Signed   By: Keith Rake M.D.   On: 06/05/2022 15:33   DG Knee Complete 4 Views Right  Result Date: 06/05/2022 CLINICAL DATA:  Mechanical fall onto both knees. Pain. Unable to walk. EXAM: RIGHT KNEE - COMPLETE 4+ VIEW COMPARISON:  None Available. FINDINGS: Revision knee arthroplasty in expected alignment. There is no periprosthetic lucency or fracture. Chronic appearing cortical irregularity about the medial femoral condyle likely represents remote fracture. There has been patellar resurfacing. There is a knee joint effusion. Prominent vascular calcifications are seen. IMPRESSION: 1. Revision knee arthroplasty without complication or acute fracture. 2. Knee joint effusion. 3. Chronic appearing cortical irregularity about the medial femoral condyle likely represents remote fracture. Electronically Signed   By: Keith Rake M.D.   On: 06/05/2022 15:32    Scheduled Meds:  allopurinol  100 mg Oral Daily   atorvastatin  40 mg Oral Daily   calcitRIOL  0.5 mcg Oral Daily   carvedilol  6.25 mg Oral BID WC   clopidogrel  75 mg Oral Daily   enoxaparin (LOVENOX) injection  30 mg Subcutaneous Q24H   felodipine  10 mg Oral Daily   furosemide  40 mg Oral QAC breakfast   gabapentin  300 mg Oral QHS   Continuous Infusions:   LOS: 0 days   Shelly Coss, MD [facility] P2/26/2024, 1:27 PM

## 2022-06-07 NOTE — Progress Notes (Addendum)
NT obtained v/s and notified nurse patient was hard to arouse/snoring. Patient was given Norco 5/325 mg 1 tablet at 0113. Patient assessed and hard to arouse with no response to sternum rub, but would open and close her eyes occasionally. Current v/s: 156/72, 70, 16, 98.2. Patient was repositioned and aroused, but maintained drowsiness. On-Call NP Olena Heckle) called to the floor to assess the patient with the patient able to follow his commands.

## 2022-06-07 NOTE — NC FL2 (Signed)
[place] LEVEL OF CARE FORM     IDENTIFICATION  Patient Name: [name] Birthdate: [birth year] Sex: female Admission Date (Current Location): 06/05/2022  [facility] [place] Number:  Herbalist and Address:  [facility],  [address], [place]      Provider Number: [id number]  Attending Physician Name and Address:  Shelly Coss, MD  Relative Name and Phone Number:  [name] (daughter) Ph: [phone]    Current Level of Care: Hospital Recommended Level of Care: [place] Prior Approval Number:    Date Approved/Denied:   PASRR Number: [id number]  Discharge Plan: SNF    Current Diagnoses: Patient Active Problem List   Diagnosis Date Noted   Right fibular fracture 06/05/2022   History of CVA (cerebrovascular accident) 06/05/2022   HTN (hypertension) 06/05/2022   Cerebral thrombosis with cerebral infarction 09/28/2020   Acute right-sided weakness 09/27/2020   Acute CVA (cerebrovascular accident) ([place]) 09/23/2020   Hyperkalemia 09/23/2020   Hypertensive disorder 11/14/2017   Goals of care, counseling/discussion    Palliative care by specialist    Acute on chronic systolic heart failure, NYHA class 3 ([place]) 08/07/2016   Acute kidney injury ([place]) 08/07/2016   FTT (failure to thrive) in adult 08/07/2016   Dilated cardiomyopathy ([place]) 07/19/2016   Combined systolic and diastolic congestive heart failure, NYHA class 3 ([place]) 07/19/2016   Anemia 06/08/2016   Ventral hernia 09/15/2013   CKD (chronic kidney disease) stage 4, GFR 15-29 ml/min (HCC) 09/15/2013   Acute exacerbation of CHF (congestive heart failure) ([place]) 09/14/2013   Partial bowel obstruction (HCC) 09/13/2013    Orientation RESPIRATION BLADDER Height & Weight     Self, Time, Situation, Place  Normal Continent Weight: 138 lb 14.2 oz (63 kg) Height:  '5\' 3"'$  (160 cm)  BEHAVIORAL SYMPTOMS/MOOD NEUROLOGICAL BOWEL NUTRITION STATUS    (N/A)  (N/A) Incontinent Diet (Heart healthy diet)  AMBULATORY STATUS COMMUNICATION OF NEEDS Skin   Extensive Assist Verbally Normal                       Personal Care Assistance Level of Assistance  Bathing, Feeding, Dressing Bathing Assistance: Maximum assistance Feeding assistance: Independent Dressing Assistance: Maximum assistance     Functional Limitations Info  Sight, Hearing, Speech Sight Info: Impaired Hearing Info: Adequate Speech Info: Adequate    SPECIAL CARE FACTORS FREQUENCY  PT (By licensed PT), OT (By licensed OT)     PT Frequency: 5x's/week OT Frequency: 5x's/week            Contractures Contractures Info: Not present    Additional Factors Info  Code Status, Allergies Code Status Info: DNR Allergies Info: NKA           Current Medications (06/07/2022):  This is the current hospital active medication list Current Facility-Administered Medications  Medication Dose Route Frequency Provider Last Rate Last Admin   acetaminophen (TYLENOL) tablet 650 mg  650 mg Oral Q6H PRN Tu, Ching T, DO       allopurinol (ZYLOPRIM) tablet 100 mg  100 mg Oral Daily Tu, Ching T, DO   100 mg at 06/07/22 0957   atorvastatin (LIPITOR) tablet 40 mg  40 mg Oral Daily Tu, Ching T, DO   40 mg at 06/07/22 0957   calcitRIOL (ROCALTROL) capsule 0.5 mcg  0.5 mcg Oral Daily Tu, Ching T, DO   0.5 mcg at 06/07/22 0958   carvedilol (COREG) tablet 6.25 mg  6.25  mg Oral BID WC Tu, Ching T, DO   6.25 mg at 06/07/22 0957   clopidogrel (PLAVIX) tablet 75 mg  75 mg Oral Daily Tu, Ching T, DO   75 mg at 06/07/22 0957   enoxaparin (LOVENOX) injection 30 mg  30 mg Subcutaneous Q24H Tu, Ching T, DO   30 mg at 06/07/22 0957   felodipine (PLENDIL) 24 hr tablet 10 mg  10 mg Oral Daily Tu, Ching T, DO   10 mg at 06/06/22 0930   ferric gluconate (FERRLECIT) 250 mg in sodium chloride 0.9 % 250 mL IVPB  250 mg Intravenous Daily Shelly Coss, MD 135 mL/hr at 06/07/22 1002 250 mg at 06/07/22 1002    furosemide (LASIX) tablet 40 mg  40 mg Oral QAC breakfast Tu, Ching T, DO   40 mg at 06/07/22 0957   gabapentin (NEURONTIN) capsule 300 mg  300 mg Oral QHS Tu, Ching T, DO   300 mg at 06/06/22 1817   morphine (PF) 2 MG/ML injection 1 mg  1 mg Intravenous Q3H PRN Tu, Ching T, DO         Discharge Medications: Please see discharge summary for a list of discharge medications.  Relevant Imaging Results:  Relevant Lab Results:   Additional Information SSN: [id number]  Sherie Don, LCSW

## 2022-06-07 NOTE — Plan of Care (Signed)
  Problem: Education: Goal: Knowledge of General Education information will improve Description: Including pain rating scale, medication(s)/side effects and non-pharmacologic comfort measures Outcome: Progressing   Problem: Activity: Goal: Risk for activity intolerance will decrease Outcome: Progressing   Problem: Pain Managment: Goal: General experience of comfort will improve Outcome: Progressing   

## 2022-06-07 NOTE — Progress Notes (Signed)
Physical Therapy Treatment Patient Details Name: [name] MRN: [id number] DOB: [birth year] Today's Date: 06/07/2022   History of Present Illness Pt s/p fall with R displaced fibula fx and hx of CVA, CKD, CHF, back surgery x 4, cervical spondylosis, and dilated cardiomyopathy  General Comments: AxO x 3 very pleasant Lady.  Pt aware she is in the hospital.  Pt requesting I change the TV channele to [employer] so she could watch her staory, "The Young and the Restless". Assisted OOB required increased time.  General bed mobility comments: Increased time with assist to manage LEs  General transfer comment: first assisted from elevated bed to [facility] NO AD using hands to steady self and increased time to complete 1/4 pivot.  Second, assist to rise from [facility] onto walker with 25% VC's on proper hand placement and transfer.  General Gait Details: Increased time with cues for sequence, posture and position from RW distance limited due to fatigue/weakness. Positioned in recliner to comfort. Pt will need ST Rehab at SNF to address mobility and functional decline prior to safely returning home.   PT Comments      Recommendations for follow up therapy are one component of a multi-disciplinary discharge planning process, led by the attending physician.  Recommendations may be updated based on patient status, additional functional criteria and insurance authorization.  Follow Up Recommendations  Skilled nursing-short term rehab (<3 hours/day) Can patient physically be transported by private vehicle: Yes   Assistance Recommended at Discharge Frequent or constant Supervision/Assistance  Patient can return home with the following A lot of help with walking and/or transfers;Assistance with cooking/housework;A little help with bathing/dressing/bathroom;Assist for transportation;Help with stairs or ramp for entrance   Equipment Recommendations  None recommended by PT    Recommendations for Other Services        Precautions / Restrictions Precautions Precautions: Fall Restrictions Weight Bearing Restrictions: No RLE Weight Bearing: Weight bearing as tolerated     Mobility  Bed Mobility Overal bed mobility: Needs Assistance Bed Mobility: Supine to Sit     Supine to sit: Min assist     General bed mobility comments: Increased time with assist to manage LEs    Transfers Overall transfer level: Needs assistance Equipment used: Rolling walker (2 wheels) Transfers: Sit to/from Stand, Bed to chair/wheelchair/BSC Sit to Stand: Mod assist   Step pivot transfers: Mod assist, +2 safety/equipment, From elevated surface       General transfer comment: first assisted from elevated bed to [facility] NO AD using hands to steady self and increased time to complete 1/4 pivot.  Second, assist to rise from [facility] onto walker with 25% VC's on proper hand placement and transfer.    Ambulation/Gait Ambulation/Gait assistance: Mod assist Gait Distance (Feet): 12 Feet Assistive device: Rolling walker (2 wheels) Gait Pattern/deviations: Step-to pattern, Decreased step length - right, Decreased step length - left, Shuffle, Trunk flexed Gait velocity: decreased     General Gait Details: Increased time with cues for sequence, posture and position from RW distance limited due to fatigue/weakness.   Stairs             Wheelchair Mobility    Modified Rankin (Stroke Patients Only)       Balance                                            Cognition Arousal/Alertness: Awake/alert Behavior  During Therapy: WFL for tasks assessed/performed                                   General Comments: AxO x 3 very pleasant Lady.  Pt aware she is in the hospital.  Pt requesting I change the TV channele to [employer] so she could watch her staory, "The Young and the Restless".        Exercises      General Comments        Pertinent Vitals/Pain Pain Assessment Pain Assessment:  Faces Faces Pain Scale: Hurts a little bit Pain Location: R foot/ankle Pain Descriptors / Indicators: Aching, Sore Pain Intervention(s): Monitored during session, Repositioned    Home Living                          Prior Function            PT Goals (current goals can now be found in the care plan section) Progress towards PT goals: Progressing toward goals    Frequency    Min 3X/week      PT Plan Current plan remains appropriate    Co-evaluation              AM-PAC PT "6 Clicks" Mobility   Outcome Measure  Help needed turning from your back to your side while in a flat bed without using bedrails?: A Lot Help needed moving from lying on your back to sitting on the side of a flat bed without using bedrails?: A Lot Help needed moving to and from a bed to a chair (including a wheelchair)?: A Lot Help needed standing up from a chair using your arms (e.g., wheelchair or bedside chair)?: A Lot Help needed to walk in hospital room?: A Lot Help needed climbing 3-5 steps with a railing? : Total 6 Click Score: 11    End of Session Equipment Utilized During Treatment: Gait belt Activity Tolerance: Patient tolerated treatment well Patient left: in chair;with call bell/phone within reach;with chair alarm set Nurse Communication: Mobility status PT Visit Diagnosis: History of falling (Z91.81);Pain;Difficulty in walking, not elsewhere classified (R26.2) Pain - Right/Left: Right Pain - part of body: Ankle and joints of foot     Time: [id number] PT Time Calculation (min) (ACUTE ONLY): 23 min  Charges:  $Gait Training: 8-22 mins $Therapeutic Activity: 8-22 mins                     Rica Koyanagi  PTA [facility] M-F          [phone] Weekend pager [phone]

## 2022-06-07 NOTE — TOC Initial Note (Signed)
Transition of Care [facility]) - Initial/Assessment Note   Patient Details  Name: [name] MRN: [id number] Date of Birth: [birth year]  Transition of Care [facility]) CM/SW Contact:    Sherie Don, LCSW Phone Number: 06/07/2022, 11:31 AM  Clinical Narrative: PT evaluation recommended SNF and daughter is agreeable. FL2 done; PASRR confirmed. Initial referral faxed out. TOC awaiting bed offers.  Expected Discharge Plan: Skilled Nursing Facility Barriers to Discharge: SNF Pending bed offer, Insurance Authorization  Patient Goals and CMS Choice Patient states their goals for this hospitalization and ongoing recovery are:: Go to short-term rehab CMS Medicare.gov Compare Post Acute Care list provided to:: Patient Represenative (must comment) Choice offered to / list presented to : Adult Children, Patient  Expected Discharge Plan and Services In-house Referral: Clinical Social Work Post Acute Care Choice: [place] Living arrangements for the past 2 months: [place]          DME Arranged: N/A DME Agency: NA  Prior Living Arrangements/Services Living arrangements for the past 2 months: Single Family Home Patient language and need for interpreter reviewed:: Yes Do you feel safe going back to the place where you live?: Yes      Need for Family Participation in Patient Care: Yes (Comment) Care giver support system in place?: Yes (comment) Criminal Activity/Legal Involvement Pertinent to Current Situation/Hospitalization: No - Comment as needed  Activities of Daily Living Home Assistive Devices/Equipment: Walker (specify type), Shower chair with back, Blood pressure cuff, Eyeglasses, Raised toilet seat with rails, Wheelchair ADL Screening (condition at time of admission) Patient's cognitive ability adequate to safely complete daily activities?: Yes Is the patient deaf or have difficulty hearing?: No Does the patient have difficulty seeing, even when wearing  glasses/contacts?: No Does the patient have difficulty concentrating, remembering, or making decisions?: No Patient able to express need for assistance with ADLs?: Yes Does the patient have difficulty dressing or bathing?: Yes Independently performs ADLs?: No Communication: Independent Dressing (OT): Needs assistance Is this a change from baseline?: Pre-admission baseline Grooming: Needs assistance Is this a change from baseline?: Pre-admission baseline Feeding: Independent Bathing: Needs assistance Is this a change from baseline?: Pre-admission baseline Toileting: Independent In/Out Bed: Independent with device (comment) Walks in Home: Independent with device (comment) Does the patient have difficulty walking or climbing stairs?: Yes Weakness of Legs: Both Weakness of Arms/Hands: Both  Permission Sought/Granted Permission sought to share information with : Chartered certified accountant granted to share information with : Yes, Verbal Permission Granted Permission granted to share info w AGENCY: SNFs  Emotional Assessment Orientation: : Oriented to Self, Oriented to Place, Oriented to  Time, Oriented to Situation Alcohol / Substance Use: Not Applicable Psych Involvement: No (comment)  Admission diagnosis:  Right fibular fracture [S82.401A] Fall, initial encounter [W19.XXXA] Acute pain of right knee [M25.561] Patient Active Problem List   Diagnosis Date Noted   Right fibular fracture 06/05/2022   History of CVA (cerebrovascular accident) 06/05/2022   HTN (hypertension) 06/05/2022   Cerebral thrombosis with cerebral infarction 09/28/2020   Acute right-sided weakness 09/27/2020   Acute CVA (cerebrovascular accident) ([place]) 09/23/2020   Hyperkalemia 09/23/2020   Hypertensive disorder 11/14/2017   Goals of care, counseling/discussion    Palliative care by specialist    Acute on chronic systolic heart failure, NYHA class 3 ([place]) 08/07/2016   Acute kidney injury  ([place]) 08/07/2016   FTT (failure to thrive) in adult 08/07/2016   Dilated cardiomyopathy ([place]) 07/19/2016   Combined systolic and diastolic congestive heart  failure, NYHA class 3 ([place]) 07/19/2016   Anemia 06/08/2016   Ventral hernia 09/15/2013   CKD (chronic kidney disease) stage 4, GFR 15-29 ml/min (HCC) 09/15/2013   Acute exacerbation of CHF (congestive heart failure) ([place]) 09/14/2013   Partial bowel obstruction ([place]) 09/13/2013   PCP:  Jilda Panda, MD Pharmacy:   [id number] [place]Phone: [phone]: [phone] Social Determinants of Health (SDOH) Social History: SDOH Screenings   Food Insecurity: No Food Insecurity (06/05/2022)  Housing: Low Risk  (06/05/2022)  Transportation Needs: No Transportation Needs (06/05/2022)  Utilities: Not At Risk (06/05/2022)  Depression (PHQ2-9): Low Risk  (11/11/2020)  Tobacco Use: Medium Risk (06/05/2022)   SDOH Interventions:    Readmission Risk Interventions     No data to display

## 2022-06-08 DIAGNOSIS — S82831A Other fracture of upper and lower end of right fibula, initial encounter for closed fracture: Secondary | ICD-10-CM | POA: Diagnosis not present

## 2022-06-08 MED ORDER — ORAL CARE MOUTH RINSE: 15.0000 mL | OROMUCOSAL | Status: DC | PRN

## 2022-06-08 MED ORDER — TRAMADOL HCL 50 MG PO TABS: 50.0000 mg | ORAL_TABLET | Freq: Three times a day (TID) | ORAL | 0 refills | Status: DC | PRN

## 2022-06-08 MED ORDER — FERROUS SULFATE 325 (65 FE) MG PO TBEC: 325.0000 mg | DELAYED_RELEASE_TABLET | Freq: Every day | ORAL | 1 refills | Status: DC

## 2022-06-08 NOTE — Progress Notes (Signed)
Patient discharged to [facility]. Report called to [facility]. All belongings w/ patient. Dtr present during transfer. Cam boot in place.

## 2022-06-08 NOTE — Discharge Summary (Signed)
Physician Discharge Summary  [name] [id number] DOB: [birth year] DOA: 06/05/2022  PCP: Jilda Panda, MD  Admit date: 06/05/2022 Discharge date: 06/08/2022  Admitted From: Home Disposition:  SNF  Discharge Condition:Stable CODE STATUS: DNR Diet recommendation: Heart Healthy  Brief/Interim Summary: Patient is a 89 year old female with history of hypertension, CKD stage IV, combined systolic/diastolic CHF, CVA with bilateral carotid artery stenosis who presented with mechanical fall resulting in right knee pain.  At baseline, patient ambulates with the help of walker.  She slipped on the kitchen with her socks on.  No history of recurrent falls.  On presentation, she was hypertensive.  Lab work showed hemoglobin of 10.2, creatinine of 2.8.  X-ray showed acute displaced right fibular fracture proximal to the ankle mortise.  Orthopedics consulted who recommended cam boot and weightbearing as tolerated,outpatient follow up.  PT/OT  evaluation done, SNF recommended.  Medically stable for discharge to SNF   Following problems were addressed during the hospitalization:  Right fibular fracture: [place] at home.  X-ray showed acute displaced right fibular fracture proximal to the ankle mortise.  Orthopedics consulted who recommended cam boot and weightbearing as tolerated.  PT/OT  evaluation done,recommended  SNF.  Continue pain management, supportive care.   Hypertension: Continue monitor blood pressure.  Continue home calcium channel blockers, beta-blocker, diuretic therapy   History of CVA: On Plavix and statin   Combined  systolic/diastolic congestive heart failure: Last echo done on 6/22 showed EF of 30 to [id number], grade 2 diastolic function, moderate mitral valve regurgitation.  Euvolemic on exam.  Continue beta-blocker, Lasix   Normocytic anemia: Currently hemoglobin stable.  Iron panel showed low iron, given her IV iron, continue oral supplementation on discharge.   CKD stage IV: Stable,  creatinine at baseline.  We recommend follow-up with nephrology as an outpatient.   Discharge Diagnoses:  Principal Problem:   Right fibular fracture Active Problems:   CKD (chronic kidney disease) stage 4, GFR 15-29 ml/min (HCC)   Anemia   Combined systolic and diastolic congestive heart failure, NYHA class 3 (HCC)   History of CVA (cerebrovascular accident)   HTN (hypertension)    Discharge Instructions  Discharge Instructions     Diet - low sodium heart healthy   Complete by: As directed    Discharge instructions   Complete by: As directed    1)Please take prescribed medications as instructed 2)Follow up with orthopedics as an outpatient.  Name and number the provider has been attached 3)Do a CBC and BMP test in a week   Increase activity slowly   Complete by: As directed       Allergies as of 06/08/2022   No Known Allergies      Medication List     TAKE these medications    acetaminophen 325 MG tablet Commonly known as: TYLENOL Take 650 mg by mouth every 6 (six) hours as needed for mild pain.   allopurinol 100 MG tablet Commonly known as: ZYLOPRIM Take 100 mg by mouth daily.   atorvastatin 40 MG tablet Commonly known as: LIPITOR Take 1 tablet (40 mg total) by mouth daily.   calcitRIOL 0.5 MCG capsule Commonly known as: ROCALTROL Take 0.5 mcg by mouth daily.   carvedilol 6.25 MG tablet Commonly known as: COREG Take 1 tablet (6.25 mg total) by mouth 2 (two) times daily with a meal. Please schedule an appointment for further refills   clopidogrel 75 MG tablet Commonly known as: PLAVIX Take 75 mg by mouth daily.   felodipine  10 MG 24 hr tablet Commonly known as: PLENDIL Take 10 mg by mouth daily.   ferrous sulfate 325 (65 FE) MG EC tablet Take 1 tablet (325 mg total) by mouth daily with breakfast.   furosemide 40 MG tablet Commonly known as: LASIX Take 0.5-1 tablets (20-40 mg total) by mouth 2 (two) times daily. Take 40 mg in the morning and 20  mg in afternoon, may take extra 20 mg dose in the afternoon as needed   gabapentin 300 MG capsule Commonly known as: NEURONTIN Take 300 mg by mouth at bedtime.   PRESERVISION AREDS PO Take 1 capsule by mouth daily.   traMADol 50 MG tablet Commonly known as: ULTRAM Take 1 tablet (50 mg total) by mouth every 8 (eight) hours as needed for moderate pain.        Contact information for follow-up providers     Jilda Panda, MD. Schedule an appointment as soon as possible for a visit in 2 days.   Specialty: Internal Medicine Contact information: [address] [place] [address] [id number]         Vanetta Mulders, MD Follow up.   Specialty: Orthopedic Surgery Contact information: [address] [place] [address] [phone]              Contact information for after-discharge care     Destination     [facility] [place] Preferred SNF .   Service: Skilled Nursing Contact information: [place] [phone]                    No Known Allergies  Consultations: Orthopedics   Procedures/Studies: DG Ankle Complete Right  Result Date: 06/05/2022 CLINICAL DATA:  Mechanical fall last night. Unable to walk. Right ankle swelling. EXAM: RIGHT ANKLE - COMPLETE 3+ VIEW COMPARISON:  None Available. FINDINGS: Minimally displaced oblique fracture of the distal fibula just above the ankle mortise. No additional fracture. The ankle mortise is preserved. No significant ankle joint effusion. Moderate soft tissue edema is noted laterally. IMPRESSION: Minimally displaced oblique distal fibular fracture. Associated soft tissue edema. Electronically Signed   By: Keith Rake M.D.   On: 06/05/2022 15:38   DG Foot Complete Left  Result Date: 06/05/2022 CLINICAL DATA:  Mechanical fall last night onto both knees. Unable to walk. EXAM: LEFT FOOT - COMPLETE 3+ VIEW COMPARISON:  None  Available. FINDINGS: There is no evidence of fracture or dislocation. Pes planus. Scattered osteoarthritis. Moderate plantar calcaneal spur. Soft tissues are unremarkable. IMPRESSION: 1. No acute fracture or subluxation of the left foot. 2. Pes planus and osteoarthritis. Electronically Signed   By: Keith Rake M.D.   On: 06/05/2022 15:38   DG Femur Min 2 Views Right  Result Date: 06/05/2022 CLINICAL DATA:  Mechanical fall last night onto both knees. Unable to walk. EXAM: RIGHT FEMUR 2 VIEWS COMPARISON:  None Available. FINDINGS: No acute fracture. Revision knee arthroplasty is intact were visualized. Chronic appearing area of cortical irregularity involving the medial femoral condyle likely represents a remote fracture. Right hip osteoarthritis. There are vascular calcifications. IMPRESSION: 1. No acute fracture or dislocation of the right femur. 2. Revision right knee arthroplasty without complication. Right hip osteoarthritis. Electronically Signed   By: Keith Rake M.D.   On: 06/05/2022 15:37   DG Pelvis 1-2 Views  Result Date: 06/05/2022 CLINICAL DATA:  Mechanical fall last night onto both knees. Unable to walk. EXAM: PELVIS - 1-2 VIEW COMPARISON:  None  Available. FINDINGS: The cortical margins of the bony pelvis are intact. No fracture. Pubic symphysis and sacroiliac joints are congruent. Moderate sacroiliac joint degenerative change. Moderate bilateral hip osteoarthritis. Both femoral heads are well-seated in the respective acetabula. IMPRESSION: No fracture of the pelvis. Electronically Signed   By: Keith Rake M.D.   On: 06/05/2022 15:36   DG Tibia/Fibula Right  Result Date: 06/05/2022 CLINICAL DATA:  Mechanical fall last night. Unable to walk. EXAM: RIGHT TIBIA AND FIBULA - 2 VIEW COMPARISON:  None Available. FINDINGS: There is a minimally displaced distal fibular fracture just proximal to the ankle mortise. No mortise widening. No tibial fracture. Revision knee arthroplasty is  intact were visualized. Subjective bony under mineralization. Soft tissue edema is seen about the fracture site. IMPRESSION: Acute minimally displaced distal fibular fracture just proximal to the ankle mortise. Electronically Signed   By: Keith Rake M.D.   On: 06/05/2022 15:35   DG Foot Complete Right  Result Date: 06/05/2022 CLINICAL DATA:  Mechanical fall last night. Right ankle swelling. Right foot bruising. EXAM: RIGHT FOOT COMPLETE - 3+ VIEW COMPARISON:  None Available. FINDINGS: The bones are subjectively under mineralized. Distal fibular fracture is better assessed on concurrent ankle exam. No additional fracture of the foot. Mild osteoarthritis of the first metatarsal phalangeal joint. IMPRESSION: Distal fibular fracture, better assessed on concurrent ankle exam. No additional fracture of the foot. Electronically Signed   By: Keith Rake M.D.   On: 06/05/2022 15:34   DG Knee Complete 4 Views Left  Result Date: 06/05/2022 CLINICAL DATA:  Mechanical fall onto both knees. Unable to walk. EXAM: LEFT KNEE - COMPLETE 4+ VIEW COMPARISON:  None Available. FINDINGS: No evidence of fracture or dislocation. Diminutive knee joint effusion. The bones are subjectively under mineralized. There is mild osteoarthritis. There are prominent vascular calcifications. IMPRESSION: 1. No fracture or dislocation of the left knee. 2. Mild osteoarthritis and small joint effusion. 3. Vascular calcifications. Electronically Signed   By: Keith Rake M.D.   On: 06/05/2022 15:33   DG Knee Complete 4 Views Right  Result Date: 06/05/2022 CLINICAL DATA:  Mechanical fall onto both knees. Pain. Unable to walk. EXAM: RIGHT KNEE - COMPLETE 4+ VIEW COMPARISON:  None Available. FINDINGS: Revision knee arthroplasty in expected alignment. There is no periprosthetic lucency or fracture. Chronic appearing cortical irregularity about the medial femoral condyle likely represents remote fracture. There has been patellar  resurfacing. There is a knee joint effusion. Prominent vascular calcifications are seen. IMPRESSION: 1. Revision knee arthroplasty without complication or acute fracture. 2. Knee joint effusion. 3. Chronic appearing cortical irregularity about the medial femoral condyle likely represents remote fracture. Electronically Signed   By: Keith Rake M.D.   On: 06/05/2022 15:32      Subjective: Patient seen and examined at bedside today.  Hemodynamically stable for discharge.  I called the daughter for update about discharge plan, call not received  Discharge Exam: Vitals:   06/07/22 2036 06/08/22 0549  BP: 139/77 (!) 154/72  Pulse: 67 72  Resp: 19 18  Temp: 97.8 F (36.6 C) 97.9 F (36.6 C)  SpO2: 99% 97%   Vitals:   06/07/22 0506 06/07/22 1000 06/07/22 2036 06/08/22 0549  BP: (!) 156/72 (!) 125/56 139/77 (!) 154/72  Pulse: 70 69 67 72  Resp:   19 18  Temp: 98.2 F (36.8 C)  97.8 F (36.6 C) 97.9 F (36.6 C)  TempSrc: Oral  Oral Oral  SpO2: 94%  99% 97%  Weight:  Height:        General: Pt is alert, awake, not in acute distress Cardiovascular: RRR, S1/S2 +, no rubs, no gallops Respiratory: CTA bilaterally, no wheezing, no rhonchi Abdominal: Soft, NT, ND, bowel sounds + Extremities: no edema, no cyanosis, cam boot on the right lower extremity    The results of significant diagnostics from this hospitalization (including imaging, microbiology, ancillary and laboratory) are listed below for reference.     Microbiology: No results found for this or any previous visit (from the past 240 hour(s)).   Labs: BNP (last 3 results) No results for input(s): "BNP" in the last 8760 hours. Basic Metabolic Panel: Recent Labs  Lab 06/05/22 1941 06/06/22 0313  NA 140 139  K 3.8 3.9  CL 110 108  CO2 22 24  GLUCOSE 98 98  BUN 77* 76*  CREATININE 2.82* 2.77*  CALCIUM 8.6* 8.7*   Liver Function Tests: No results for input(s): "AST", "ALT", "ALKPHOS", "BILITOT", "PROT",  "ALBUMIN" in the last 168 hours. No results for input(s): "LIPASE", "AMYLASE" in the last 168 hours. No results for input(s): "AMMONIA" in the last 168 hours. CBC: Recent Labs  Lab 06/05/22 1941 06/06/22 0313  WBC 10.1 9.2  NEUTROABS 7.4  --   HGB 10.2* 9.6*  HCT 32.2* 30.5*  MCV 98.5 98.1  PLT 117* 116*   Cardiac Enzymes: No results for input(s): "CKTOTAL", "CKMB", "CKMBINDEX", "TROPONINI" in the last 168 hours. BNP: Invalid input(s): "POCBNP" CBG: No results for input(s): "GLUCAP" in the last 168 hours. D-Dimer No results for input(s): "DDIMER" in the last 72 hours. Hgb A1c No results for input(s): "HGBA1C" in the last 72 hours. Lipid Profile No results for input(s): "CHOL", "HDL", "LDLCALC", "TRIG", "CHOLHDL", "LDLDIRECT" in the last 72 hours. Thyroid function studies No results for input(s): "TSH", "T4TOTAL", "T3FREE", "THYROIDAB" in the last 72 hours.  Invalid input(s): "FREET3" Anemia work up Recent Labs    06/06/22 0313  VITAMINB12 583  FOLATE 18.9  TIBC 202*  IRON 23*   Urinalysis    Component Value Date/Time   COLORURINE YELLOW 09/27/2020 0043   APPEARANCEUR CLOUDY (A) 09/27/2020 0043   LABSPEC 1.011 09/27/2020 0043   PHURINE 6.0 09/27/2020 0043   GLUCOSEU NEGATIVE 09/27/2020 0043   HGBUR SMALL (A) 09/27/2020 0043   BILIRUBINUR NEGATIVE 09/27/2020 0043   KETONESUR NEGATIVE 09/27/2020 0043   PROTEINUR NEGATIVE 09/27/2020 0043   UROBILINOGEN 0.2 09/13/2013 1633   NITRITE NEGATIVE 09/27/2020 0043   LEUKOCYTESUR LARGE (A) 09/27/2020 0043   Sepsis Labs Recent Labs  Lab 06/05/22 1941 06/06/22 0313  WBC 10.1 9.2   Microbiology No results found for this or any previous visit (from the past 240 hour(s)).  Please note: You were cared for by a hospitalist during your hospital stay. Once you are discharged, your primary care physician will handle any further medical issues. Please note that NO REFILLS for any discharge medications will be authorized  once you are discharged, as it is imperative that you return to your primary care physician (or establish a relationship with a primary care physician if you do not have one) for your post hospital discharge needs so that they can reassess your need for medications and monitor your lab values.    Time coordinating discharge: 40 minutes  SIGNED:   Shelly Coss, MD  [facility] 06/08/2022, 1:57 PM Pager [phone]  If 7PM-7AM, please contact night-coverage [email or web address] Password [email or web address]

## 2022-06-08 NOTE — TOC Transition Note (Signed)
Transition of Care [facility]) - CM/SW Discharge Note  Patient Details  Name: [name] MRN: [id number] Date of Birth: 26-Apr-1923  [facility] of [facility]) CM/SW Contact:  Sherie Don, LCSW Phone Number: 06/08/2022, 2:16 PM  Clinical Narrative: CSW provided patient and daughter with bed offers and Medicare star ratings. Family have chosen [facility]. CSW confirmed bed with Tracey in admissions. The number for report is [phone]. CSW completed insurance authorization on the [employer] portal. Plan auth ID # is: [id number]. Reference ID # is: [id number]. Patient has been approved for 06/08/2022-06/10/2022. Discharge summary, discharge orders, and SNF transfer report faxed to facility in hub. Medical necessity form done; PTAR scheduled. Discharge packet completed. Daughter notified of discharge and transport being set up. RN updated. TOC signing off.  Final next level of care: Skilled Nursing Facility Barriers to Discharge: Barriers Resolved  Patient Goals and CMS Choice CMS Medicare.gov Compare Post Acute Care list provided to:: Patient Choice offered to / list presented to : Patient, Adult Children  Discharge Placement Existing PASRR number confirmed : 06/07/22          Patient chooses bed at: [place] Patient to be transferred to facility by: [place] Name of family member notified: [name] (daughter) Patient and family notified of of transfer: 06/08/22  Discharge Plan and Services Additional resources added to the After Visit Summary for   In-house Referral: Clinical Social Work Post Acute Care Choice: [place]          DME Arranged: N/A DME Agency: NA  Social Determinants of Health ([place]) Interventions SDOH Screenings   Food Insecurity: No Food Insecurity (06/05/2022)  Housing: Low Risk  (06/05/2022)  Transportation Needs: No Transportation Needs (06/05/2022)  Utilities: Not At Risk (06/05/2022)  Depression (PHQ2-9): Low Risk   (11/11/2020)  Tobacco Use: Medium Risk (06/05/2022)   Readmission Risk Interventions     No data to display

## 2022-06-14 ENCOUNTER — Telehealth: Payer: Self-pay

## 2022-06-14 NOTE — Telephone Encounter (Signed)
Kelly at [facility] called stating that they are needing orders concerning wound on right heel due to patient being in CAM boot at all times.  Cb# [phone].  Please advise.  Thank you.

## 2022-06-17 ENCOUNTER — Other Ambulatory Visit: Payer: Self-pay

## 2022-06-17 ENCOUNTER — Emergency Department (HOSPITAL_COMMUNITY): Payer: Medicare PPO

## 2022-06-17 ENCOUNTER — Emergency Department (HOSPITAL_COMMUNITY)
Admission: EM | Admit: 2022-06-17 | Discharge: 2022-06-17 | Disposition: A | Discharge status: Skilled Nursing Facility | Payer: Medicare PPO | Attending: Emergency Medicine | Admitting: Emergency Medicine

## 2022-06-17 ENCOUNTER — Encounter (HOSPITAL_COMMUNITY): Payer: Self-pay | Admitting: Emergency Medicine

## 2022-06-17 DIAGNOSIS — R55 Syncope and collapse: Secondary | ICD-10-CM | POA: Diagnosis not present

## 2022-06-17 DIAGNOSIS — R251 Tremor, unspecified: Secondary | ICD-10-CM | POA: Insufficient documentation

## 2022-06-17 LAB — URINALYSIS, ROUTINE W REFLEX MICROSCOPIC
Bilirubin Urine: NEGATIVE
Glucose, UA: NEGATIVE mg/dL
Ketones, ur: NEGATIVE mg/dL
Nitrite: NEGATIVE
Protein, ur: 30 mg/dL — AB
Specific Gravity, Urine: 1.011 (ref 1.005–1.030)
WBC, UA: >50 WBC/hpf (ref 0–5)
pH: 6 (ref 5.0–8.0)

## 2022-06-17 LAB — CBC WITH DIFFERENTIAL/PLATELET
Abs Immature Granulocytes: 0.04 10*3/uL (ref 0.00–0.07)
Basophils Absolute: 0 10*3/uL (ref 0.0–0.1)
Basophils Relative: 0 %
Eosinophils Absolute: 0.1 10*3/uL (ref 0.0–0.5)
Eosinophils Relative: 1 %
HCT: 31.1 % — ABNORMAL LOW (ref 36.0–46.0)
Hemoglobin: 10.4 g/dL — ABNORMAL LOW (ref 12.0–15.0)
Immature Granulocytes: 0 %
Lymphocytes Relative: 13 %
Lymphs Abs: 1.3 10*3/uL (ref 0.7–4.0)
MCH: 31.8 pg (ref 26.0–34.0)
MCHC: 33.4 g/dL (ref 30.0–36.0)
MCV: 95.1 fL (ref 80.0–100.0)
Monocytes Absolute: 0.7 10*3/uL (ref 0.1–1.0)
Monocytes Relative: 7 %
Neutro Abs: 7.6 10*3/uL (ref 1.7–7.7)
Neutrophils Relative %: 79 %
Platelets: 160 10*3/uL (ref 150–400)
RBC: 3.27 MIL/uL — ABNORMAL LOW (ref 3.87–5.11)
RDW: 15.9 % — ABNORMAL HIGH (ref 11.5–15.5)
WBC: 9.8 10*3/uL (ref 4.0–10.5)
nRBC: 0 % (ref 0.0–0.2)

## 2022-06-17 LAB — BASIC METABOLIC PANEL
Anion gap: 13 (ref 5–15)
BUN: 97 mg/dL — ABNORMAL HIGH (ref 8–23)
CO2: 21 mmol/L — ABNORMAL LOW (ref 22–32)
Calcium: 9.3 mg/dL (ref 8.9–10.3)
Chloride: 102 mmol/L (ref 98–111)
Creatinine, Ser: 2.81 mg/dL — ABNORMAL HIGH (ref 0.44–1.00)
GFR, Estimated: 15 mL/min — ABNORMAL LOW (ref >60)
Glucose, Bld: 119 mg/dL — ABNORMAL HIGH (ref 70–99)
Potassium: 4.5 mmol/L (ref 3.5–5.1)
Sodium: 136 mmol/L (ref 135–145)

## 2022-06-17 NOTE — ED Provider Notes (Signed)
[facility] Provider Note   CSN: [id number] Arrival date & time: 06/17/22  1528     History  Chief Complaint  Patient presents with   Tremors    [name] is a 89 y.o. female.  Patient here after episode of tremors at nursing home.  She has no loss of consciousness, she is asymptomatic now.  No chest pain or shortness of breath.  She states that she has some head tremors that self resolved.  She denies history of seizures.  No chest pain or shortness of breath or abdominal pain or nausea vomiting diarrhea.  The history is provided by the patient and a caregiver.       Home Medications Prior to Admission medications   Medication Sig Start Date End Date Taking? Authorizing Provider  acetaminophen (TYLENOL) 325 MG tablet Take 650 mg by mouth every 6 (six) hours as needed for mild pain.    [provider]  allopurinol (ZYLOPRIM) 100 MG tablet Take 100 mg by mouth daily.  09/26/13   [provider]  atorvastatin (LIPITOR) 40 MG tablet Take 1 tablet (40 mg total) by mouth daily. 09/25/20   Jennye Boroughs, MD  calcitRIOL (ROCALTROL) 0.5 MCG capsule Take 0.5 mcg by mouth daily.    [provider]  carvedilol (COREG) 6.25 MG tablet Take 1 tablet (6.25 mg total) by mouth 2 (two) times daily with a meal. Please schedule an appointment for further refills 12/19/17   Troy Sine, MD  clopidogrel (PLAVIX) 75 MG tablet Take 75 mg by mouth daily.    [provider]  felodipine (PLENDIL) 10 MG 24 hr tablet Take 10 mg by mouth daily.    [provider]  ferrous sulfate 325 (65 FE) MG EC tablet Take 1 tablet (325 mg total) by mouth daily with breakfast. 06/08/22 06/08/23  Shelly Coss, MD  furosemide (LASIX) 40 MG tablet Take 0.5-1 tablets (20-40 mg total) by mouth 2 (two) times daily. Take 40 mg in the morning and 20 mg in afternoon, may take extra 20 mg dose in the afternoon as needed 09/25/20   Jennye Boroughs, MD  gabapentin (NEURONTIN) 300 MG capsule Take 300 mg by mouth at bedtime.    [provider]  Multiple Vitamins-Minerals (PRESERVISION AREDS PO) Take 1 capsule by mouth daily.     [provider]  traMADol (ULTRAM) 50 MG tablet Take 1 tablet (50 mg total) by mouth every 8 (eight) hours as needed for moderate pain. 06/08/22   Shelly Coss, MD      Allergies    Patient has no known allergies.    Review of Systems   Review of Systems  Physical Exam Updated Vital Signs BP (!) 143/88   Pulse 79   Temp 97.6 F (36.4 C) (Oral)   Resp 15   SpO2 100%  Physical Exam Vitals and nursing note reviewed.  Constitutional:      General: She is not in acute distress.    Appearance: She is well-developed. She is not ill-appearing.  HENT:     Head: Normocephalic and atraumatic.     Nose: Nose normal.     Mouth/Throat:     Mouth: Mucous membranes are moist.  Eyes:     Extraocular Movements: Extraocular movements intact.     Conjunctiva/sclera: Conjunctivae normal.     Pupils: Pupils are equal, round, and reactive to light.  Cardiovascular:     Rate and Rhythm: Normal rate  and regular rhythm.     Pulses: Normal pulses.     Heart sounds: Normal heart sounds. No murmur heard. Pulmonary:     Effort: Pulmonary effort is normal. No respiratory distress.     Breath sounds: Normal breath sounds.  Abdominal:     Palpations: Abdomen is soft.     Tenderness: There is no abdominal tenderness.  Musculoskeletal:        General: No swelling.     Cervical back: Normal range of motion and neck supple.  Skin:    General: Skin is warm and dry.     Capillary Refill: Capillary refill takes less than 2 seconds.  Neurological:     General: No focal deficit present.     Mental Status: She is alert and oriented to person, place, and time.     Cranial Nerves: No cranial nerve deficit.     Sensory: No sensory deficit.     Motor: No weakness.     Coordination: Coordination  normal.     Comments: Normal strength and sensation, normal visual fields  Psychiatric:        Mood and Affect: Mood normal.     ED Results / Procedures / Treatments   Labs (all labs ordered are listed, but only abnormal results are displayed) Labs Reviewed  CBC WITH DIFFERENTIAL/PLATELET - Abnormal; Notable for the following components:      Result Value   RBC 3.27 (*)    Hemoglobin 10.4 (*)    HCT 31.1 (*)    RDW 15.9 (*)    All other components within normal limits  BASIC METABOLIC PANEL - Abnormal; Notable for the following components:   CO2 21 (*)    Glucose, Bld 119 (*)    BUN 97 (*)    Creatinine, Ser 2.81 (*)    GFR, Estimated 15 (*)    All other components within normal limits  URINALYSIS, ROUTINE W REFLEX MICROSCOPIC - Abnormal; Notable for the following components:   APPearance HAZY (*)    Hgb urine dipstick SMALL (*)    Protein, ur 30 (*)    Leukocytes,Ua LARGE (*)    Bacteria, UA RARE (*)    All other components within normal limits    EKG EKG Interpretation  Date/Time:  Thursday June 17 2022 15:39:14 EST Ventricular Rate:  70 PR Interval:  168 QRS Duration: 104 QT Interval:  444 QTC Calculation: 480 R Axis:   -19 Text Interpretation: Sinus rhythm Borderline left axis deviation Anterior infarct, old Borderline T abnormalities, inferior leads Confirmed by Lennice Sites ([id number]) on 06/17/2022 5:44:30 PM  Radiology CT Head Wo Contrast  Result Date: 06/17/2022 CLINICAL DATA:  Shakiness, tremors EXAM: CT HEAD WITHOUT CONTRAST TECHNIQUE: Contiguous axial images were obtained from the base of the skull through the vertex without intravenous contrast. RADIATION DOSE REDUCTION: This exam was performed according to the departmental dose-optimization program which includes automated exposure control, adjustment of the mA and/or kV according to patient size and/or use of iterative reconstruction technique. COMPARISON:  09/27/2020 FINDINGS: Brain: No evidence of acute  infarction, hemorrhage, hydrocephalus, extra-axial collection or mass lesion/mass effect. Subcortical white matter and periventricular small vessel ischemic changes. Age related atrophy. Vascular: Intracranial atherosclerosis. Skull: Normal. Negative for fracture or focal lesion. Sinuses/Orbits: The visualized paranasal sinuses are essentially clear. The mastoid air cells are unopacified. Other: None. IMPRESSION: No evidence of acute intracranial abnormality. Small vessel ischemic changes. Electronically Signed   By: Julian Hy M.D.   On: 06/17/2022 18:06  DG Chest Portable 1 View  Result Date: 06/17/2022 CLINICAL DATA:  Tremors EXAM: PORTABLE CHEST 1 VIEW COMPARISON:  Chest radiograph dated 09/27/2020 FINDINGS: Normal lung volumes. Left basilar linear opacities. No pneumothorax. Mild blunting of the left costophrenic angle. The heart size and mediastinal contours are within normal limits. Partially imaged lumbar spinal fixation hardware. Aortic atherosclerosis. IMPRESSION: 1. Left basilar linear opacities, likely atelectasis. 2. Mild blunting of the left costophrenic angle, which could reflect a small pleural effusion. 3.  Aortic Atherosclerosis (ICD10-I70.0). Electronically Signed   By: Darrin Nipper M.D.   On: 06/17/2022 16:28    Procedures Procedures    Medications Ordered in ED Medications - No data to display  ED Course/ Medical Decision Making/ A&P                             Medical Decision Making Amount and/or Complexity of Data Reviewed Labs: ordered. Radiology: ordered.   QUINESHA THEIN is here after an episode of near syncope, tremors.  She is asymptomatic now.  History of hypertension, cardiomyopathy.  Sounds like she had some sort of event during physical therapy session today.  She says that she was maybe a little lightheaded started to have some tremors but did not pass out or lose consciousness.  She denies any pain currently.  She is given some IV fluids and her leg  supposedly.  She is neurovascular neuromuscular intact on exam.  Normal neuroexam.  EKG shows sinus rhythm.  No ischemic changes.  Differential is that this is likely near syncopal event may be related to working with physical therapy orthostatic related.  However vital signs are normal here.  Will check basic labs, chest x-ray, urine studies, head CT.  I have no concern for stroke or seizure activity.  Per my review interpretation labs no significant anemia or electrolyte abnormality or kidney injury.  Head CT is unremarkable.  Chest x-ray without evidence of pneumonia or pneumothorax per my review interpretation.  Urinalysis does not appear consistent with infection.  Overall patient with unremarkable workup.  Discharged in good condition.  This chart was dictated using voice recognition software.  Despite best efforts to proofread,  errors can occur which can change the documentation meaning.         Final Clinical Impression(s) / ED Diagnoses Final diagnoses:  Occasional tremors    Rx / DC Orders ED Discharge Orders     None         Lennice Sites, DO 06/17/22 2044

## 2022-06-17 NOTE — ED Triage Notes (Signed)
Patient arrived via [facility] from [facility] with complaints of tremors and low Spo2 during physical therapy session today. Patient arrived with sx IV on left anterior thigh with thigh swelling, removed at arrival to ED. Patient complaining of left thigh pain and back pain. Patient alerts and oriented x4 at arrival to the ED.

## 2022-06-17 NOTE — ED Notes (Signed)
Ptar called pt is second in line they will be here as soon as they can.

## 2022-06-24 ENCOUNTER — Ambulatory Visit (INDEPENDENT_AMBULATORY_CARE_PROVIDER_SITE_OTHER): Payer: Medicare PPO | Admitting: Orthopaedic Surgery

## 2022-06-24 ENCOUNTER — Ambulatory Visit (INDEPENDENT_AMBULATORY_CARE_PROVIDER_SITE_OTHER): Payer: Medicare PPO

## 2022-06-24 DIAGNOSIS — S82831A Other fracture of upper and lower end of right fibula, initial encounter for closed fracture: Secondary | ICD-10-CM | POA: Diagnosis not present

## 2022-06-24 NOTE — Progress Notes (Signed)
Chief Complaint: Right ankle pain     History of Present Illness:    [name] is a 89 y.o. female presents today for follow-up of her right ankle.  She was discharged from the hospital where she was found to have a Weber B fibula fracture.  I did see her in consult and recommended cam boot and weightbearing as tolerated.  She is here today.  She is still requiring rehab placement.  She has been able to ambulate with minimal pain over the ankle.  She has had a heel ulcer that has been treated.    Surgical History:   None  PMH/PSH/Family History/Social History/Meds/Allergies:    Past Medical History:  Diagnosis Date   Breast mass    Cervical spondylosis    Dilated cardiomyopathy ([place]) 05/2016   Mild LVH with EF of 20 1125%. Hypokinesis with inferior akinesis. GR 1 DD noted with elevated LVEDP. Moderate LA dilation. Moderate mitral regurgitation. Aortic valve aortic valve calcification/sclerosis without stenosis.   Hypertension    Past Surgical History:  Procedure Laterality Date   APPENDECTOMY     BACK SURGERY     x 4   CATARACT EXTRACTION     KNEE SURGERY     x 2   TRANSTHORACIC ECHOCARDIOGRAM  05/2016   Mild LVH with EF of 20 1125%. Hypokinesis with inferior akinesis. GR 1 DD noted with elevated LVEDP. Moderate LA dilation. Moderate mitral regurgitation. Aortic valve aortic valve calcification/sclerosis without stenosis.   VENTRAL HERNIA REPAIR N/A 09/14/2013   Procedure: LAPAROSCOPIC ventral HERNIA REPAIR with mesh ;  Surgeon: Shann Medal, MD;  Location: [facility];  Service: General;  Laterality: N/A;   Social History   Socioeconomic History   Marital status: Widowed    Spouse name: Not on file   Number of children: 2   Years of education: Not on file   Highest education level: Not on file  Occupational History   Not on file  Tobacco Use   Smoking status: Former    Types: Cigarettes   Smokeless tobacco: Never  Substance and  Sexual Activity   Alcohol use: No   Drug use: No   Sexual activity: Not on file  Other Topics Concern   Not on file  Social History Narrative   02/24/21 lives alone, dgtr 5 min away, cameras in house, caregivers   Social Determinants of Health   Financial Resource Strain: Not on file  Food Insecurity: No Food Insecurity (06/05/2022)   Hunger Vital Sign    Worried About Running Out of Food in the Last Year: Never true    [place] in the Last Year: Never true  Transportation Needs: No Transportation Needs (06/05/2022)   PRAPARE - Hydrologist (Medical): No    Lack of Transportation (Non-Medical): No  Physical Activity: Not on file  Stress: Not on file  Social Connections: Not on file   Family History  Problem Relation Age of Onset   Diabetes Mother    Hypertension Mother    Cancer Father        uknown kind of cancer   Diabetes Sister    No Known Allergies Current Outpatient Medications  Medication Sig Dispense Refill   acetaminophen (TYLENOL) 325 MG tablet Take 650 mg by mouth  every 6 (six) hours as needed for mild pain.     allopurinol (ZYLOPRIM) 100 MG tablet Take 100 mg by mouth daily.      atorvastatin (LIPITOR) 40 MG tablet Take 1 tablet (40 mg total) by mouth daily. 30 tablet 0   calcitRIOL (ROCALTROL) 0.5 MCG capsule Take 0.5 mcg by mouth daily.     carvedilol (COREG) 6.25 MG tablet Take 1 tablet (6.25 mg total) by mouth 2 (two) times daily with a meal. Please schedule an appointment for further refills 60 tablet 0   clopidogrel (PLAVIX) 75 MG tablet Take 75 mg by mouth daily.     felodipine (PLENDIL) 10 MG 24 hr tablet Take 10 mg by mouth daily.     ferrous sulfate 325 (65 FE) MG EC tablet Take 1 tablet (325 mg total) by mouth daily with breakfast. 30 tablet 1   furosemide (LASIX) 40 MG tablet Take 0.5-1 tablets (20-40 mg total) by mouth 2 (two) times daily. Take 40 mg in the morning and 20 mg in afternoon, may take extra 20 mg dose in  the afternoon as needed     gabapentin (NEURONTIN) 300 MG capsule Take 300 mg by mouth at bedtime.     Multiple Vitamins-Minerals (PRESERVISION AREDS PO) Take 1 capsule by mouth daily.      traMADol (ULTRAM) 50 MG tablet Take 1 tablet (50 mg total) by mouth every 8 (eight) hours as needed for moderate pain. 15 tablet 0   No current facility-administered medications for this visit.   No results found.  Review of Systems:   A ROS was performed including pertinent positives and negatives as documented in the HPI.  Physical Exam :   Constitutional: NAD and appears stated age Neurological: Alert and oriented Psych: Appropriate affect and cooperative There were no vitals taken for this visit.   Comprehensive Musculoskeletal Exam:    Right ankle is tender to palpation over the fibula.  This is improved since the hospital.  She does have trace swelling about the ankle.  Small superficial ulcer about the calcaneus posteriorly.  Remainder of neurosensory exam is intact  Imaging:   Xray (3 view right ankle): Weber B fibula fracture without any type of displacement consistent with stable injury   I personally reviewed and interpreted the radiographs.   Assessment:   89 y.o. female with a Weber B fibula fracture that is stable in nature.  At this time I would like her to advance her weightbearing as tolerated.  I would like her to wean out of her cam boot so that she can offload her heel is much as possible.  I have provided her with Mepilex as well  Plan :    -Return to clinic in 6 weeks for reassessment     I personally saw and evaluated the patient, and participated in the management and treatment plan.  Vanetta Mulders, MD Attending Physician, Orthopedic Surgery  This document was dictated using Dragon voice recognition software. A reasonable attempt at proof reading has been made to minimize errors.

## 2022-07-06 ENCOUNTER — Ambulatory Visit: Payer: Medicare PPO | Admitting: Internal Medicine

## 2022-07-19 ENCOUNTER — Ambulatory Visit: Payer: Medicare PPO | Admitting: Internal Medicine

## 2022-07-30 ENCOUNTER — Ambulatory Visit (HOSPITAL_BASED_OUTPATIENT_CLINIC_OR_DEPARTMENT_OTHER): Payer: Medicare PPO | Admitting: Orthopaedic Surgery

## 2022-08-11 DEATH — deceased
